# Patient Record
Sex: Female | Born: 1945 | Race: Black or African American | Hispanic: No | Smoking: Former smoker
Health system: Southern US, Community
[De-identification: ages and names within clinical notes are randomized; demographics above are authoritative.]

## PROBLEM LIST (undated history)

## (undated) DIAGNOSIS — K219 Gastro-esophageal reflux disease without esophagitis: Secondary | ICD-10-CM

## (undated) DIAGNOSIS — R519 Headache, unspecified: Secondary | ICD-10-CM

## (undated) DIAGNOSIS — I10 Essential (primary) hypertension: Secondary | ICD-10-CM

## (undated) DIAGNOSIS — E119 Type 2 diabetes mellitus without complications: Secondary | ICD-10-CM

## (undated) DIAGNOSIS — R51 Headache: Secondary | ICD-10-CM

## (undated) DIAGNOSIS — G8929 Other chronic pain: Secondary | ICD-10-CM

## (undated) DIAGNOSIS — J45909 Unspecified asthma, uncomplicated: Secondary | ICD-10-CM

## (undated) DIAGNOSIS — M1990 Unspecified osteoarthritis, unspecified site: Secondary | ICD-10-CM

## (undated) DIAGNOSIS — T7840XA Allergy, unspecified, initial encounter: Secondary | ICD-10-CM

## (undated) DIAGNOSIS — K5790 Diverticulosis of intestine, part unspecified, without perforation or abscess without bleeding: Secondary | ICD-10-CM

## (undated) DIAGNOSIS — N809 Endometriosis, unspecified: Secondary | ICD-10-CM

## (undated) DIAGNOSIS — M069 Rheumatoid arthritis, unspecified: Secondary | ICD-10-CM

## (undated) DIAGNOSIS — I219 Acute myocardial infarction, unspecified: Secondary | ICD-10-CM

## (undated) DIAGNOSIS — I2510 Atherosclerotic heart disease of native coronary artery without angina pectoris: Secondary | ICD-10-CM

## (undated) DIAGNOSIS — F32A Depression, unspecified: Secondary | ICD-10-CM

## (undated) DIAGNOSIS — E559 Vitamin D deficiency, unspecified: Secondary | ICD-10-CM

## (undated) DIAGNOSIS — R4586 Emotional lability: Secondary | ICD-10-CM

## (undated) DIAGNOSIS — E7800 Pure hypercholesterolemia, unspecified: Secondary | ICD-10-CM

## (undated) HISTORY — DX: Vitamin D deficiency, unspecified: E55.9

## (undated) HISTORY — DX: Atherosclerotic heart disease of native coronary artery without angina pectoris: I25.10

## (undated) HISTORY — PX: EXPLORATORY LAPAROTOMY: SUR591

## (undated) HISTORY — DX: Depression, unspecified: F32.A

## (undated) HISTORY — DX: Unspecified osteoarthritis, unspecified site: M19.90

## (undated) HISTORY — DX: Pure hypercholesterolemia, unspecified: E78.00

## (undated) HISTORY — DX: Gastro-esophageal reflux disease without esophagitis: K21.9

## (undated) HISTORY — DX: Essential (primary) hypertension: I10

## (undated) HISTORY — DX: Unspecified asthma, uncomplicated: J45.909

## (undated) HISTORY — DX: Headache, unspecified: R51.9

## (undated) HISTORY — DX: Acute myocardial infarction, unspecified: I21.9

## (undated) HISTORY — DX: Emotional lability: R45.86

## (undated) HISTORY — DX: Rheumatoid arthritis, unspecified: M06.9

## (undated) HISTORY — DX: Type 2 diabetes mellitus without complications: E11.9

## (undated) HISTORY — DX: Diverticulosis of intestine, part unspecified, without perforation or abscess without bleeding: K57.90

## (undated) HISTORY — DX: Headache: R51

## (undated) HISTORY — DX: Endometriosis, unspecified: N80.9

## (undated) HISTORY — DX: Other chronic pain: G89.29

## (undated) HISTORY — PX: ABDOMINAL HYSTERECTOMY: SHX81

## (undated) HISTORY — DX: Allergy, unspecified, initial encounter: T78.40XA

---

## 1997-09-21 ENCOUNTER — Encounter: Admission: RE | Admit: 1997-09-21 | Discharge: 1997-09-21 | Payer: Self-pay | Admitting: Family Medicine

## 1997-10-07 ENCOUNTER — Emergency Department (HOSPITAL_COMMUNITY): Admission: EM | Admit: 1997-10-07 | Discharge: 1997-10-07 | Payer: Self-pay

## 1997-11-11 ENCOUNTER — Emergency Department (HOSPITAL_COMMUNITY): Admission: EM | Admit: 1997-11-11 | Discharge: 1997-11-11 | Payer: Self-pay | Admitting: Emergency Medicine

## 1998-11-03 ENCOUNTER — Inpatient Hospital Stay (HOSPITAL_COMMUNITY): Admission: AD | Admit: 1998-11-03 | Discharge: 1998-11-03 | Payer: Self-pay | Admitting: General Practice

## 1999-09-13 ENCOUNTER — Encounter: Payer: Self-pay | Admitting: Emergency Medicine

## 1999-09-13 ENCOUNTER — Emergency Department (HOSPITAL_COMMUNITY): Admission: EM | Admit: 1999-09-13 | Discharge: 1999-09-13 | Payer: Self-pay | Admitting: Emergency Medicine

## 1999-10-20 ENCOUNTER — Encounter: Admission: RE | Admit: 1999-10-20 | Discharge: 1999-10-20 | Payer: Self-pay | Admitting: Family Medicine

## 1999-10-20 ENCOUNTER — Encounter: Payer: Self-pay | Admitting: Family Medicine

## 1999-10-28 ENCOUNTER — Encounter: Payer: Self-pay | Admitting: *Deleted

## 1999-10-28 ENCOUNTER — Encounter: Admission: RE | Admit: 1999-10-28 | Discharge: 1999-10-28 | Payer: Self-pay | Admitting: *Deleted

## 2000-04-26 ENCOUNTER — Emergency Department (HOSPITAL_COMMUNITY): Admission: EM | Admit: 2000-04-26 | Discharge: 2000-04-26 | Payer: Self-pay | Admitting: Emergency Medicine

## 2000-06-06 ENCOUNTER — Encounter: Admission: RE | Admit: 2000-06-06 | Discharge: 2000-06-06 | Payer: Self-pay | Admitting: Internal Medicine

## 2000-07-27 ENCOUNTER — Emergency Department (HOSPITAL_COMMUNITY): Admission: EM | Admit: 2000-07-27 | Discharge: 2000-07-28 | Payer: Self-pay | Admitting: *Deleted

## 2000-07-28 ENCOUNTER — Encounter: Payer: Self-pay | Admitting: Emergency Medicine

## 2000-08-20 ENCOUNTER — Emergency Department (HOSPITAL_COMMUNITY): Admission: EM | Admit: 2000-08-20 | Discharge: 2000-08-20 | Payer: Self-pay | Admitting: Emergency Medicine

## 2000-08-20 ENCOUNTER — Encounter: Payer: Self-pay | Admitting: Emergency Medicine

## 2001-01-15 ENCOUNTER — Emergency Department (HOSPITAL_COMMUNITY): Admission: EM | Admit: 2001-01-15 | Discharge: 2001-01-16 | Payer: Self-pay | Admitting: Emergency Medicine

## 2001-01-16 ENCOUNTER — Encounter: Payer: Self-pay | Admitting: Emergency Medicine

## 2001-02-25 ENCOUNTER — Emergency Department (HOSPITAL_COMMUNITY): Admission: EM | Admit: 2001-02-25 | Discharge: 2001-02-25 | Payer: Self-pay | Admitting: Emergency Medicine

## 2001-04-05 ENCOUNTER — Encounter: Payer: Self-pay | Admitting: Emergency Medicine

## 2001-04-05 ENCOUNTER — Emergency Department (HOSPITAL_COMMUNITY): Admission: EM | Admit: 2001-04-05 | Discharge: 2001-04-05 | Payer: Self-pay | Admitting: Emergency Medicine

## 2001-05-08 DIAGNOSIS — K226 Gastro-esophageal laceration-hemorrhage syndrome: Secondary | ICD-10-CM

## 2001-05-08 HISTORY — DX: Gastro-esophageal laceration-hemorrhage syndrome: K22.6

## 2001-07-04 ENCOUNTER — Ambulatory Visit (HOSPITAL_COMMUNITY): Admission: RE | Admit: 2001-07-04 | Discharge: 2001-07-04 | Payer: Self-pay | Admitting: Obstetrics

## 2001-07-04 ENCOUNTER — Encounter: Payer: Self-pay | Admitting: Obstetrics

## 2001-07-06 ENCOUNTER — Emergency Department (HOSPITAL_COMMUNITY): Admission: EM | Admit: 2001-07-06 | Discharge: 2001-07-06 | Payer: Self-pay | Admitting: Emergency Medicine

## 2001-08-16 ENCOUNTER — Ambulatory Visit (HOSPITAL_COMMUNITY): Admission: RE | Admit: 2001-08-16 | Discharge: 2001-08-16 | Payer: Self-pay | Admitting: *Deleted

## 2001-08-30 ENCOUNTER — Ambulatory Visit (HOSPITAL_COMMUNITY): Admission: RE | Admit: 2001-08-30 | Discharge: 2001-08-30 | Payer: Self-pay | Admitting: *Deleted

## 2001-09-01 ENCOUNTER — Emergency Department (HOSPITAL_COMMUNITY): Admission: EM | Admit: 2001-09-01 | Discharge: 2001-09-02 | Payer: Self-pay

## 2001-09-13 ENCOUNTER — Ambulatory Visit (HOSPITAL_COMMUNITY): Admission: RE | Admit: 2001-09-13 | Discharge: 2001-09-13 | Payer: Self-pay | Admitting: *Deleted

## 2001-10-31 ENCOUNTER — Encounter: Admission: RE | Admit: 2001-10-31 | Discharge: 2001-10-31 | Payer: Self-pay | Admitting: Internal Medicine

## 2002-03-05 ENCOUNTER — Emergency Department (HOSPITAL_COMMUNITY): Admission: EM | Admit: 2002-03-05 | Discharge: 2002-03-05 | Payer: Self-pay | Admitting: Emergency Medicine

## 2002-03-05 ENCOUNTER — Encounter: Payer: Self-pay | Admitting: Emergency Medicine

## 2002-03-10 ENCOUNTER — Ambulatory Visit (HOSPITAL_COMMUNITY): Admission: RE | Admit: 2002-03-10 | Discharge: 2002-03-10 | Payer: Self-pay | Admitting: *Deleted

## 2002-04-04 ENCOUNTER — Encounter: Payer: Self-pay | Admitting: Emergency Medicine

## 2002-04-04 ENCOUNTER — Emergency Department (HOSPITAL_COMMUNITY): Admission: EM | Admit: 2002-04-04 | Discharge: 2002-04-04 | Payer: Self-pay | Admitting: Emergency Medicine

## 2002-07-07 ENCOUNTER — Emergency Department (HOSPITAL_COMMUNITY): Admission: EM | Admit: 2002-07-07 | Discharge: 2002-07-07 | Payer: Self-pay | Admitting: *Deleted

## 2002-07-07 ENCOUNTER — Encounter: Payer: Self-pay | Admitting: Emergency Medicine

## 2002-09-25 ENCOUNTER — Other Ambulatory Visit: Admission: RE | Admit: 2002-09-25 | Discharge: 2002-09-25 | Payer: Self-pay | Admitting: *Deleted

## 2002-12-15 ENCOUNTER — Emergency Department (HOSPITAL_COMMUNITY): Admission: EM | Admit: 2002-12-15 | Discharge: 2002-12-15 | Payer: Self-pay | Admitting: Emergency Medicine

## 2002-12-15 ENCOUNTER — Encounter: Payer: Self-pay | Admitting: Emergency Medicine

## 2003-02-05 ENCOUNTER — Emergency Department (HOSPITAL_COMMUNITY): Admission: AD | Admit: 2003-02-05 | Discharge: 2003-02-05 | Payer: Self-pay | Admitting: Emergency Medicine

## 2003-02-25 ENCOUNTER — Ambulatory Visit (HOSPITAL_COMMUNITY): Admission: RE | Admit: 2003-02-25 | Discharge: 2003-02-25 | Payer: Self-pay | Admitting: Obstetrics

## 2003-02-25 ENCOUNTER — Encounter: Payer: Self-pay | Admitting: Obstetrics

## 2003-05-22 ENCOUNTER — Encounter: Payer: Self-pay | Admitting: Emergency Medicine

## 2003-05-22 ENCOUNTER — Inpatient Hospital Stay (HOSPITAL_COMMUNITY): Admission: EM | Admit: 2003-05-22 | Discharge: 2003-05-28 | Payer: Self-pay | Admitting: Psychiatry

## 2003-07-16 ENCOUNTER — Emergency Department (HOSPITAL_COMMUNITY): Admission: AD | Admit: 2003-07-16 | Discharge: 2003-07-16 | Payer: Self-pay | Admitting: Family Medicine

## 2003-09-21 ENCOUNTER — Emergency Department (HOSPITAL_COMMUNITY): Admission: EM | Admit: 2003-09-21 | Discharge: 2003-09-21 | Payer: Self-pay | Admitting: Emergency Medicine

## 2003-09-22 ENCOUNTER — Inpatient Hospital Stay (HOSPITAL_COMMUNITY): Admission: EM | Admit: 2003-09-22 | Discharge: 2003-09-28 | Payer: Self-pay | Admitting: Psychiatry

## 2003-09-24 ENCOUNTER — Encounter: Payer: Self-pay | Admitting: Psychiatry

## 2003-11-26 ENCOUNTER — Encounter: Admission: RE | Admit: 2003-11-26 | Discharge: 2003-11-26 | Payer: Self-pay | Admitting: Cardiology

## 2003-11-27 ENCOUNTER — Ambulatory Visit (HOSPITAL_COMMUNITY): Admission: RE | Admit: 2003-11-27 | Discharge: 2003-11-27 | Payer: Self-pay | Admitting: Cardiology

## 2003-12-06 ENCOUNTER — Encounter: Admission: RE | Admit: 2003-12-06 | Discharge: 2003-12-06 | Payer: Self-pay | Admitting: Cardiology

## 2004-01-27 ENCOUNTER — Ambulatory Visit (HOSPITAL_COMMUNITY): Admission: RE | Admit: 2004-01-27 | Discharge: 2004-01-27 | Payer: Self-pay | Admitting: Cardiology

## 2004-06-14 ENCOUNTER — Emergency Department (HOSPITAL_COMMUNITY): Admission: EM | Admit: 2004-06-14 | Discharge: 2004-06-14 | Payer: Self-pay | Admitting: Family Medicine

## 2004-07-27 ENCOUNTER — Emergency Department (HOSPITAL_COMMUNITY): Admission: EM | Admit: 2004-07-27 | Discharge: 2004-07-27 | Payer: Self-pay | Admitting: Family Medicine

## 2004-09-16 ENCOUNTER — Emergency Department (HOSPITAL_COMMUNITY): Admission: EM | Admit: 2004-09-16 | Discharge: 2004-09-16 | Payer: Self-pay | Admitting: Emergency Medicine

## 2005-03-01 ENCOUNTER — Emergency Department (HOSPITAL_COMMUNITY): Admission: EM | Admit: 2005-03-01 | Discharge: 2005-03-01 | Payer: Self-pay | Admitting: Family Medicine

## 2005-03-15 ENCOUNTER — Emergency Department (HOSPITAL_COMMUNITY): Admission: EM | Admit: 2005-03-15 | Discharge: 2005-03-15 | Payer: Self-pay | Admitting: Emergency Medicine

## 2005-03-21 ENCOUNTER — Emergency Department (HOSPITAL_COMMUNITY): Admission: EM | Admit: 2005-03-21 | Discharge: 2005-03-21 | Payer: Self-pay | Admitting: Emergency Medicine

## 2005-05-02 ENCOUNTER — Ambulatory Visit: Payer: Self-pay | Admitting: Internal Medicine

## 2005-05-04 ENCOUNTER — Ambulatory Visit (HOSPITAL_BASED_OUTPATIENT_CLINIC_OR_DEPARTMENT_OTHER): Admission: RE | Admit: 2005-05-04 | Discharge: 2005-05-04 | Payer: Self-pay | Admitting: Internal Medicine

## 2005-05-07 ENCOUNTER — Ambulatory Visit: Payer: Self-pay | Admitting: Internal Medicine

## 2005-09-21 ENCOUNTER — Inpatient Hospital Stay (HOSPITAL_COMMUNITY): Admission: EM | Admit: 2005-09-21 | Discharge: 2005-09-27 | Payer: Self-pay | Admitting: Psychiatry

## 2005-09-21 ENCOUNTER — Encounter: Payer: Self-pay | Admitting: Emergency Medicine

## 2005-09-22 ENCOUNTER — Ambulatory Visit: Payer: Self-pay | Admitting: Psychiatry

## 2006-05-10 ENCOUNTER — Ambulatory Visit (HOSPITAL_COMMUNITY): Admission: RE | Admit: 2006-05-10 | Discharge: 2006-05-10 | Payer: Self-pay | Admitting: Internal Medicine

## 2006-08-07 ENCOUNTER — Ambulatory Visit: Payer: Self-pay | Admitting: Gastroenterology

## 2006-08-09 ENCOUNTER — Ambulatory Visit: Payer: Self-pay | Admitting: Internal Medicine

## 2006-08-17 ENCOUNTER — Emergency Department (HOSPITAL_COMMUNITY): Admission: EM | Admit: 2006-08-17 | Discharge: 2006-08-17 | Payer: Self-pay | Admitting: Family Medicine

## 2007-05-20 ENCOUNTER — Emergency Department (HOSPITAL_COMMUNITY): Admission: EM | Admit: 2007-05-20 | Discharge: 2007-05-20 | Payer: Self-pay | Admitting: Family Medicine

## 2007-09-09 ENCOUNTER — Emergency Department (HOSPITAL_COMMUNITY): Admission: EM | Admit: 2007-09-09 | Discharge: 2007-09-09 | Payer: Self-pay | Admitting: Emergency Medicine

## 2008-04-21 ENCOUNTER — Emergency Department (HOSPITAL_COMMUNITY): Admission: EM | Admit: 2008-04-21 | Discharge: 2008-04-21 | Payer: Self-pay | Admitting: Emergency Medicine

## 2008-07-12 ENCOUNTER — Emergency Department (HOSPITAL_COMMUNITY): Admission: EM | Admit: 2008-07-12 | Discharge: 2008-07-12 | Payer: Self-pay | Admitting: Family Medicine

## 2008-08-26 ENCOUNTER — Emergency Department (HOSPITAL_COMMUNITY): Admission: EM | Admit: 2008-08-26 | Discharge: 2008-08-26 | Payer: Self-pay | Admitting: Family Medicine

## 2008-10-20 ENCOUNTER — Encounter: Admission: RE | Admit: 2008-10-20 | Discharge: 2008-10-20 | Payer: Self-pay | Admitting: Internal Medicine

## 2009-06-01 ENCOUNTER — Emergency Department (HOSPITAL_COMMUNITY): Admission: EM | Admit: 2009-06-01 | Discharge: 2009-06-01 | Payer: Self-pay | Admitting: Emergency Medicine

## 2009-10-07 ENCOUNTER — Inpatient Hospital Stay (HOSPITAL_COMMUNITY): Admission: AD | Admit: 2009-10-07 | Discharge: 2009-10-07 | Payer: Self-pay | Admitting: Obstetrics & Gynecology

## 2009-12-05 ENCOUNTER — Emergency Department (HOSPITAL_COMMUNITY): Admission: EM | Admit: 2009-12-05 | Discharge: 2009-12-05 | Payer: Self-pay | Admitting: Family Medicine

## 2010-01-17 ENCOUNTER — Emergency Department (HOSPITAL_COMMUNITY): Admission: EM | Admit: 2010-01-17 | Discharge: 2010-01-17 | Payer: Self-pay | Admitting: Family Medicine

## 2010-03-11 ENCOUNTER — Ambulatory Visit (HOSPITAL_COMMUNITY): Admission: RE | Admit: 2010-03-11 | Discharge: 2010-03-11 | Payer: Self-pay | Admitting: Internal Medicine

## 2010-05-17 ENCOUNTER — Emergency Department (HOSPITAL_COMMUNITY)
Admission: EM | Admit: 2010-05-17 | Discharge: 2010-05-17 | Payer: Self-pay | Source: Home / Self Care | Admitting: Family Medicine

## 2010-05-23 LAB — POCT I-STAT, CHEM 8
BUN: 5 mg/dL — ABNORMAL LOW (ref 6–23)
Calcium, Ion: 1.12 mmol/L (ref 1.12–1.32)
Chloride: 108 mEq/L (ref 96–112)
Creatinine, Ser: 1 mg/dL (ref 0.4–1.2)
Glucose, Bld: 95 mg/dL (ref 70–99)
HCT: 40 % (ref 36.0–46.0)
Hemoglobin: 13.6 g/dL (ref 12.0–15.0)
Potassium: 3.6 mEq/L (ref 3.5–5.1)
Sodium: 142 mEq/L (ref 135–145)
TCO2: 27 mmol/L (ref 0–100)

## 2010-05-29 ENCOUNTER — Encounter: Payer: Self-pay | Admitting: Cardiology

## 2010-07-21 LAB — POCT URINALYSIS DIPSTICK
Nitrite: NEGATIVE
Protein, ur: NEGATIVE mg/dL
Specific Gravity, Urine: <=1.005 (ref 1.005–1.030)
Urobilinogen, UA: 0.2 mg/dL (ref 0.0–1.0)

## 2010-07-21 LAB — CULTURE, ROUTINE-ABSCESS: Gram Stain: NONE SEEN

## 2010-07-23 LAB — POCT URINALYSIS DIP (DEVICE)
Nitrite: NEGATIVE
Protein, ur: NEGATIVE mg/dL
Specific Gravity, Urine: 1.025 (ref 1.005–1.030)
Urobilinogen, UA: 0.2 mg/dL (ref 0.0–1.0)

## 2010-07-25 LAB — URINALYSIS, ROUTINE W REFLEX MICROSCOPIC
Bilirubin Urine: NEGATIVE
Glucose, UA: NEGATIVE mg/dL
Ketones, ur: NEGATIVE mg/dL
Protein, ur: NEGATIVE mg/dL

## 2010-07-25 LAB — URINE CULTURE: Colony Count: 5000

## 2010-07-25 LAB — WET PREP, GENITAL
Trich, Wet Prep: NONE SEEN
Yeast Wet Prep HPF POC: NONE SEEN

## 2010-09-23 NOTE — Procedures (Signed)
NAME:  [name], [name]              ACCOUNT NO.:  [id number]   MEDICAL RECORD NO.:  [id number]          PATIENT TYPE:  OUT   LOCATION:  SLEEP CENTER                 FACILITY:  [facility]   PHYSICIAN:  Clinton D. Maple Hudson, M.D. DATE OF BIRTH:  17-Apr-1946   DATE OF STUDY:                              NOCTURNAL POLYSOMNOGRAM   REFERRING PHYSICIAN:  Dr. Jetty Duhamel.   DATE OF STUDY:  May 04, 2005.   INDICATION FOR STUDY:  Insomnia with sleep apnea.   EPWORTH SLEEPINESS SCORE:  6/24.   BMI:  34.   WEIGHT:  180 pounds.   HOME MEDICATIONS:  Allegra, Zelnorm, Zegerid, ibuprofen, Rhinocort nasal  spray.   SLEEP ARCHITECTURE:  Total sleep time 349 minutes with sleep efficiency 82%.  Stage I was 2%, stage II 86%, stages III and IV absent, REM 13% of total  sleep time. Sleep latency 9 minutes, REM latency 107 minutes, awake after  sleep onset 52 minutes, arousal index 17. No bedtime medication reported.   RESPIRATORY DATA:  Apnea/hypopnea index (AHI, RDI) 13.9 obstructive events  per hour indicating mild obstructive sleep apnea/hypopnea syndrome. There  were 6 obstructive apneas and 75 hypopneas. Events were not positional. REM  AHI 31.4. She slept on sides and supine. She did not qualify for split  protocol because of delayed onset of events.   OXYGEN DATA:  Moderate to loud snoring with oxygen desaturation to a nadir  of 86%. Mean oxygen saturation through the study was 94% on room air.   CARDIAC DATA:  Sinus rhythm with PACs.   MOVEMENT/PARASOMNIA:  Rare leg jerk. The patient gives a history of sleep  walking and sleep talking but none was noted during the study.   IMPRESSION/RECOMMENDATIONS:  1.  Mild obstructive sleep apnea/hypopnea syndrome, AHI 13.9 per hour with      nonpositional events more common during REM. Moderate to loud snoring      with oxygen desaturation to 86%.  2.  Consider return for C-PAP titration or evaluate for alternative      therapies as appropriate.  3.  No parasomnias reported although the patient gives history of such      events occasionally at home.      Clinton D. Maple Hudson, M.D.  Diplomate, Biomedical engineer of Sleep Medicine  Electronically Signed     CDY/MEDQ  D:  05/07/2005 11:09:22  T:  05/07/2005 16:57:21  Job:  [id number]
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
## 2010-09-23 NOTE — Consult Note (Signed)
NAMETURQUOISE, ESCH              ACCOUNT NO.:  [id number]   MEDICAL RECORD NO.:  [id number]          PATIENT TYPE:  OUT   LOCATION:  [facility]                          FACILITY:  [facility]   PHYSICIAN:  Georgiana Spinner, M.D.    DATE OF BIRTH:  August 15, 1945   DATE OF CONSULTATION:  DATE OF DISCHARGE:  05/10/2006                                 CONSULTATION   PHONE CONVERSATION:  Area code: 41, [phone]   This is a 65 year old lady, patient of Dr. Judie Petit T. Russella Dar who is  complaining of abdominal pain.  She said she knows it is gas pain  because she has had it before.  She has tried various laxatives without  much success for her chronic constipation.  She said she is getting  relief with Gas-X, four tablets, every couple of hours.  She had checked  with the pharmacy who said there was no limit to how much she could  take.  I told her, as long as she is getting relief, that she could  continue to take them and call us if she has any further problems.           ______________________________  Georgiana Spinner, M.D.     GMO/MEDQ  D:  06/22/2006  T:  06/24/2006  Job:  [id number]   cc:   Venita Lick. Russella Dar, MD, [facility]
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
## 2011-02-09 LAB — URINALYSIS, ROUTINE W REFLEX MICROSCOPIC
Bilirubin Urine: NEGATIVE
Nitrite: NEGATIVE
Specific Gravity, Urine: 1.027 (ref 1.005–1.030)
pH: 5.5 (ref 5.0–8.0)

## 2011-02-09 LAB — URINE MICROSCOPIC-ADD ON

## 2011-02-09 LAB — DIFFERENTIAL
Eosinophils Absolute: 0 10*3/uL (ref 0.0–0.7)
Lymphocytes Relative: 10 % — ABNORMAL LOW (ref 12–46)
Lymphs Abs: 1.2 10*3/uL (ref 0.7–4.0)
Monocytes Relative: 4 % (ref 3–12)
Neutro Abs: 11 10*3/uL — ABNORMAL HIGH (ref 1.7–7.7)
Neutrophils Relative %: 86 % — ABNORMAL HIGH (ref 43–77)

## 2011-02-09 LAB — BASIC METABOLIC PANEL
Chloride: 104 mEq/L (ref 96–112)
Creatinine, Ser: 1.01 mg/dL (ref 0.4–1.2)
GFR calc Af Amer: >60 mL/min (ref >60)

## 2011-02-09 LAB — CBC
MCV: 95.3 fL (ref 78.0–100.0)
RBC: 3.9 MIL/uL (ref 3.87–5.11)
WBC: 12.8 10*3/uL — ABNORMAL HIGH (ref 4.0–10.5)

## 2012-05-09 ENCOUNTER — Ambulatory Visit (HOSPITAL_BASED_OUTPATIENT_CLINIC_OR_DEPARTMENT_OTHER): Payer: Medicare Other | Attending: Internal Medicine | Admitting: Radiology

## 2012-05-09 VITALS — Ht 61.0 in | Wt 217.0 lb

## 2012-05-09 DIAGNOSIS — G4733 Obstructive sleep apnea (adult) (pediatric): Secondary | ICD-10-CM

## 2012-05-11 DIAGNOSIS — G4730 Sleep apnea, unspecified: Secondary | ICD-10-CM

## 2012-05-11 DIAGNOSIS — G4710 Hypersomnia, unspecified: Secondary | ICD-10-CM

## 2012-05-11 NOTE — Procedures (Signed)
NAME:  [name], [name]              ACCOUNT NO.:  [id number]  MEDICAL RECORD NO.:  [id number]          PATIENT TYPE:  OUT  LOCATION:  SLEEP CENTER                 FACILITY:  [facility]  PHYSICIAN:  Angalena Cousineau D. Maple Hudson, MD, FCCP, FACPDATE OF BIRTH:  07/18/1945  DATE OF STUDY:  05/09/2012                           NOCTURNAL POLYSOMNOGRAM  REFERRING PHYSICIAN:  Jackie Plum, M.D.  REFERRING PHYSICIAN:  Jackie Plum, MD  INDICATION FOR STUDY:  Hypersomnia with sleep apnea.  EPWORTH SLEEPINESS SCORE:  5/24.  BMI 24, weight 127 pounds, height 61 inches, neck 13 inches.  MEDICATIONS:  Home medications charted and reviewed.  There is available documentation of an Apnea Link report from February 28, 2012 indicating an AHI of 7 per hour consistent with mild sleep apnea.  The current study is scheduled for CPAP titration.  SLEEP ARCHITECTURE:  Total sleep time 35 minutes with sleep efficiency 7.9%.  Stage I was 11.4%, stage II 88.6%, stages III and REM were absent.  Sleep latency 188 minutes.  Awake after sleep onset 138.5 minutes.  Arousal index of 25.7.  Bedtime medication:  None.  She slept only briefly between midnight and 1 a.m. and again around 3 a.m.  RESPIRATORY DATA:  Apnea/hypopnea index (AHI) 0 per hour.  No respiratory events were recorded.  CPAP titration could not be done.  OXYGEN DATA:  No snoring noted by the technician.  Oxygen desaturation to a nadir of 94% with mean oxygen saturation through the study of 96.3% on room air.  CARDIAC DATA:  Normal sinus rhythm.  MOVEMENT/PARASOMNIA:  No significant movement disturbance.  Bathroom x2.  IMPRESSION/RECOMMENDATION: 1. The patient was not able to maintain sleep and recorded only 35     minutes of total sleep time throughout this study night.  No     specific issues were reported.  No bedtime medication was taken. 2. During limited sleep opportunity, no significant respiratory events     were recorded.  The CPAP titration  could not be successfully done. 3. An original diagnostic home sleep study (Apnea Link) on February 28, 2012 had recorded an AHI of 7 per     hour. 4. If appropriate, 1 option would be to repeat this CPAP titration     study bringing a sleep medication.     [name] D. Maple Hudson, MD, [facility], FACP Diplomate, American Board of Sleep Medicine    CDY/MEDQ  D:  05/11/2012 10:21:29  T:  05/11/2012 13:04:30  Job:  [id number]

## 2013-01-22 ENCOUNTER — Ambulatory Visit (INDEPENDENT_AMBULATORY_CARE_PROVIDER_SITE_OTHER): Payer: Medicare Other | Admitting: Obstetrics

## 2013-01-22 ENCOUNTER — Encounter: Payer: Self-pay | Admitting: Obstetrics

## 2013-01-22 VITALS — BP 137/85 | HR 70 | Temp 98.0°F | Ht 61.0 in | Wt 139.0 lb

## 2013-01-22 DIAGNOSIS — N951 Menopausal and female climacteric states: Secondary | ICD-10-CM | POA: Insufficient documentation

## 2013-01-22 DIAGNOSIS — R350 Frequency of micturition: Secondary | ICD-10-CM

## 2013-01-22 LAB — POCT URINALYSIS DIPSTICK
Bilirubin, UA: NEGATIVE
Blood, UA: NEGATIVE
Glucose, UA: NEGATIVE
Ketones, UA: NEGATIVE
Leukocytes, UA: NEGATIVE
Nitrite, UA: NEGATIVE
Protein, UA: NEGATIVE
Spec Grav, UA: <=1.005
Urobilinogen, UA: NEGATIVE
pH, UA: 5

## 2013-01-22 MED ORDER — ESTRADIOL 0.05 MG/24HR TD PTTW: 1.0000 | MEDICATED_PATCH | TRANSDERMAL | Status: DC

## 2013-01-22 NOTE — Progress Notes (Signed)
Subjective:     KEREN ALVERIO is a 67 y.o. female here for a routine exam.  Current complaints: Patient is in the office for annual exam- last pap over 1 year. Patient had hysterectomy for pain and endometriosis.Patient would like to discuss frequent urination, sweating , restlessness at night, and mood swings.Patient reports she sweats every day. Personal health questionnaire reviewed: no.   Gynecologic History No LMP recorded. Patient has had a hysterectomy. Contraception: none Last Pap: over 1 year. Results were: normal Last mammogram: 2014. Results were: normal  Obstetric History OB History  No data available     The following portions of the patient's history were reviewed and updated as appropriate: allergies, current medications, past family history, past medical history, past social history, past surgical history and problem list.  Review of Systems Pertinent items are noted in HPI.    Objective:    General appearance: alert and no distress PE:  Omitted.  Patient has had a hysterectomy and BSO.  Refused exam.      Assessment:    Menopausal Hot Flushes  Urinary Frequency.   Plan:    Education reviewed: calcium supplements, depression evaluation, self breast exams and menopausal management. Follow up in: 3 months. Estradiol transdermal patch Rx.

## 2013-01-23 ENCOUNTER — Encounter: Payer: Self-pay | Admitting: Obstetrics

## 2013-04-10 ENCOUNTER — Encounter (HOSPITAL_COMMUNITY)
Admission: RE | Disposition: A | Discharge status: Home / Self Care | Payer: Self-pay | Source: Ambulatory Visit | Attending: Cardiovascular Disease

## 2013-04-10 ENCOUNTER — Ambulatory Visit (HOSPITAL_COMMUNITY)
Admission: RE | Admit: 2013-04-10 | Discharge: 2013-04-10 | Disposition: A | Discharge status: Home / Self Care | Payer: Medicare Other | Source: Ambulatory Visit | Attending: Cardiovascular Disease | Admitting: Cardiovascular Disease

## 2013-04-10 DIAGNOSIS — J45909 Unspecified asthma, uncomplicated: Secondary | ICD-10-CM | POA: Insufficient documentation

## 2013-04-10 DIAGNOSIS — R079 Chest pain, unspecified: Secondary | ICD-10-CM | POA: Diagnosis present

## 2013-04-10 DIAGNOSIS — F39 Unspecified mood [affective] disorder: Secondary | ICD-10-CM | POA: Insufficient documentation

## 2013-04-10 DIAGNOSIS — I10 Essential (primary) hypertension: Secondary | ICD-10-CM | POA: Insufficient documentation

## 2013-04-10 HISTORY — PX: LEFT HEART CATHETERIZATION WITH CORONARY ANGIOGRAM: SHX5451

## 2013-04-10 LAB — CBC
MCV: 91.6 fL (ref 78.0–100.0)
Platelets: 248 10*3/uL (ref 150–400)
RBC: 3.82 MIL/uL — ABNORMAL LOW (ref 3.87–5.11)
RDW: 13.9 % (ref 11.5–15.5)
WBC: 4.6 10*3/uL (ref 4.0–10.5)

## 2013-04-10 LAB — PROTIME-INR
INR: 1.01 (ref 0.00–1.49)
Prothrombin Time: 13.1 seconds (ref 11.6–15.2)

## 2013-04-10 LAB — BASIC METABOLIC PANEL
CO2: 23 mEq/L (ref 19–32)
Calcium: 8.6 mg/dL (ref 8.4–10.5)
GFR calc Af Amer: 67 mL/min — ABNORMAL LOW (ref >90)
GFR calc non Af Amer: 58 mL/min — ABNORMAL LOW (ref >90)
Sodium: 141 mEq/L (ref 135–145)

## 2013-04-10 LAB — APTT: aPTT: 23 seconds — ABNORMAL LOW (ref 24–37)

## 2013-04-10 SURGERY — LEFT HEART CATHETERIZATION WITH CORONARY ANGIOGRAM
Anesthesia: LOCAL

## 2013-04-10 MED ORDER — LIDOCAINE HCL (PF) 1 % IJ SOLN
INTRAMUSCULAR | Status: AC
  Filled 2013-04-10: qty 30

## 2013-04-10 MED ORDER — ONDANSETRON HCL 4 MG/2ML IJ SOLN: 4.0000 mg | Freq: Four times a day (QID) | INTRAMUSCULAR | Status: DC | PRN

## 2013-04-10 MED ORDER — FENTANYL CITRATE 0.05 MG/ML IJ SOLN
INTRAMUSCULAR | Status: AC
  Filled 2013-04-10: qty 2

## 2013-04-10 MED ORDER — ASPIRIN 81 MG PO CHEW
81.0000 mg | CHEWABLE_TABLET | ORAL | Status: AC
  Administered 2013-04-10: 81 mg via ORAL

## 2013-04-10 MED ORDER — ASPIRIN 81 MG PO CHEW
CHEWABLE_TABLET | ORAL | Status: AC
  Filled 2013-04-10: qty 1

## 2013-04-10 MED ORDER — SODIUM CHLORIDE 0.9 % IV SOLN: INTRAVENOUS | Status: AC

## 2013-04-10 MED ORDER — SODIUM CHLORIDE 0.9 % IJ SOLN: 3.0000 mL | INTRAMUSCULAR | Status: DC | PRN

## 2013-04-10 MED ORDER — SODIUM CHLORIDE 0.9 % IV SOLN
INTRAVENOUS | Status: DC
  Administered 2013-04-10: 07:00:00 via INTRAVENOUS

## 2013-04-10 MED ORDER — NITROGLYCERIN 0.2 MG/ML ON CALL CATH LAB
INTRAVENOUS | Status: AC
  Filled 2013-04-10: qty 1

## 2013-04-10 MED ORDER — SODIUM CHLORIDE 0.9 % IV SOLN: 250.0000 mL | INTRAVENOUS | Status: DC | PRN

## 2013-04-10 MED ORDER — MIDAZOLAM HCL 2 MG/2ML IJ SOLN
INTRAMUSCULAR | Status: AC
  Filled 2013-04-10: qty 2

## 2013-04-10 MED ORDER — ACETAMINOPHEN 325 MG PO TABS: 650.0000 mg | ORAL_TABLET | ORAL | Status: DC | PRN

## 2013-04-10 MED ORDER — HEPARIN (PORCINE) IN NACL 2-0.9 UNIT/ML-% IJ SOLN
INTRAMUSCULAR | Status: AC
  Filled 2013-04-10: qty 1000

## 2013-04-10 MED ORDER — SODIUM CHLORIDE 0.9 % IJ SOLN: 3.0000 mL | Freq: Two times a day (BID) | INTRAMUSCULAR | Status: DC

## 2013-04-10 NOTE — H&P (Signed)
[name] is an 67 y.o. female.   Chief Complaint: Chest pain HPI: 67 years old female with exertional chest pain had abnormal stress test.  Past Medical History  Diagnosis Date  . Hypertension   . Asthma   . Mood swings   . Endometriosis       Past Surgical History  Procedure Laterality Date  . Abdominal hysterectomy    . Exploratory laparotomy      Family History  Problem Relation Age of Onset  . Cancer Mother   . Heart disease Mother   . Cancer Father   . Heart disease Father    Social History:  reports that she has quit smoking. She does not have any smokeless tobacco history on file. She reports that she drinks alcohol. She reports that she does not use illicit drugs.  Allergies: Not on File  Medications Prior to Admission  Medication Sig Dispense Refill  . amLODipine (NORVASC) 2.5 MG tablet Take 2.5 mg by mouth daily.      . beclomethasone (QVAR) 80 MCG/ACT inhaler Inhale 1 puff into the lungs as needed.      . benztropine (COGENTIN) 1 MG tablet Take 1 mg by mouth daily.      Marland Kitchen esomeprazole (NEXIUM) 40 MG capsule Take 40 mg by mouth daily before breakfast.      . estradiol (VIVELLE-DOT) 0.05 MG/24HR patch Place 1 patch (0.05 mg total) onto the skin once a week.  8 patch  12  . sertraline (ZOLOFT) 50 MG tablet Take 50 mg by mouth daily.        No results found for this or any previous visit (from the past 48 hour(s)). No results found.  ROS No weight gain or loss, + reading glasses, No asthma, No COPD, No GI or GU bleed, No stroke, seizures. + psychiatric admission There were no vitals taken for this visit. General appearance: cooperative and appears stated age Head: Normocephalic, without obvious abnormality, atraumatic Eyes: conjunctivae/corneas clear. PERRL, EOM's intact. Neck: no adenopathy, no carotid bruit, no JVD, supple, symmetrical, trachea midline and thyroid not enlarged, symmetric, no tenderness/mass/nodules Resp: clear to auscultation  bilaterally Cardio: regular rate and rhythm, S1, S2 normal, no murmur, click, rub or gallop GI: soft, non-tender; bowel sounds normal; no masses,  no organomegaly Extremities: extremities normal, atraumatic, no cyanosis or edema Skin: Skin color, texture, turgor normal. No rashes or lesions Neurologic: Alert and oriented X 3, normal strength and tone. Normal coordination and gait  Assessment/Plan Chest pain CAD Chronic schitzophrenia  C. Cath today.  Amorita Vanrossum S 04/10/2013, 6:31 AM

## 2013-04-10 NOTE — CV Procedure (Signed)
PROCEDURE:  Left heart catheterization with selective coronary angiography, left ventriculogram.  CLINICAL HISTORY:  This is a 67 year old female with shortness of breath, chest pain on exertion had abnormal stress test.  The risks, benefits, and details of the procedure were explained to the patient.  The patient verbalized understanding and wanted to proceed.  Informed written consent was obtained.  PROCEDURE TECHNIQUE:  The patient was approached from the right femoral artery using a 5 French short sheath.  Left coronary angiography was done using a Judkins L4 guide catheter.  Right coronary angiography was done using a Judkins R4 guide catheter.  Left ventriculography was done using a pigtail catheter.    CONTRAST:  Total of 43 cc.  COMPLICATIONS:  None.  At the end of the procedure a manual device was used for hemostasis.    HEMODYNAMICS:  Aortic pressure was 126/66; LV pressure was 129/7; LVEDP 11.  There was no gradient between the left ventricle and aorta.    ANGIOGRAM/CORONARY ARTERIOGRAM:   The left main coronary artery is unremarkable.  The left anterior descending artery wraps around apex of the heart and is unremarkable.  The left circumflex artery is unremarkable. OM 1, 2 and 3 are also unremarkable. Diagonal one is unremarkable  The right coronary artery is dominant and unremarkable.  LEFT VENTRICULOGRAM:  Left ventricular angiogram was done in the 30 RAO projection and revealed normal left ventricular wall motion and systolic function with an estimated ejection fraction of 70 %.  LVEDP was 11 mmHg.  IMPRESSION OF HEART CATHETERIZATION:   1. Normal left main coronary artery. 2. Normal left anterior descending artery and its branches. 3. Normal left circumflex artery and its branches. 4. Normal right coronary artery. 5. Normal left ventricular systolic function.  LVEDP 11 mmHg.  Ejection fraction 70 %.  RECOMMENDATION:   Medical treatment.

## 2013-04-10 NOTE — Interval H&P Note (Signed)
History and Physical Interval Note:  04/10/2013 6:42 AM  [name]  has presented today for surgery, with the diagnosis of Chest pain  The various methods of treatment have been discussed with the patient and family. After consideration of risks, benefits and other options for treatment, the patient has consented to  Procedure(s): LEFT HEART CATHETERIZATION WITH CORONARY ANGIOGRAM (N/A) as a surgical intervention .  The patient's history has been reviewed, patient examined, no change in status, stable for surgery.  I have reviewed the patient's chart and labs.  Questions were answered to the patient's satisfaction.     Marguerette Sheller S

## 2013-04-23 ENCOUNTER — Ambulatory Visit: Payer: Medicare Other | Admitting: Obstetrics

## 2013-11-11 ENCOUNTER — Other Ambulatory Visit: Payer: Self-pay | Admitting: Cardiovascular Disease

## 2013-11-11 ENCOUNTER — Ambulatory Visit
Admission: RE | Admit: 2013-11-11 | Discharge: 2013-11-11 | Disposition: A | Discharge status: Home / Self Care | Payer: Medicare Other | Source: Ambulatory Visit | Attending: Cardiovascular Disease | Admitting: Cardiovascular Disease

## 2013-11-11 DIAGNOSIS — R200 Anesthesia of skin: Secondary | ICD-10-CM

## 2013-11-14 ENCOUNTER — Other Ambulatory Visit: Payer: Self-pay | Admitting: Cardiovascular Disease

## 2013-11-14 DIAGNOSIS — M542 Cervicalgia: Secondary | ICD-10-CM

## 2013-11-20 ENCOUNTER — Ambulatory Visit
Admission: RE | Admit: 2013-11-20 | Discharge: 2013-11-20 | Disposition: A | Discharge status: Home / Self Care | Payer: Medicare Other | Source: Ambulatory Visit | Attending: Cardiovascular Disease | Admitting: Cardiovascular Disease

## 2013-11-20 DIAGNOSIS — M542 Cervicalgia: Secondary | ICD-10-CM

## 2014-04-16 ENCOUNTER — Encounter (HOSPITAL_COMMUNITY): Payer: Self-pay | Admitting: Cardiovascular Disease

## 2014-05-15 ENCOUNTER — Encounter: Payer: Self-pay | Admitting: Gastroenterology

## 2014-10-27 ENCOUNTER — Other Ambulatory Visit (HOSPITAL_COMMUNITY): Payer: Self-pay | Admitting: Internal Medicine

## 2014-10-27 DIAGNOSIS — Z1231 Encounter for screening mammogram for malignant neoplasm of breast: Secondary | ICD-10-CM

## 2014-11-04 ENCOUNTER — Ambulatory Visit (HOSPITAL_COMMUNITY): Payer: Medicare Other

## 2014-11-05 ENCOUNTER — Ambulatory Visit (HOSPITAL_COMMUNITY)
Admission: RE | Admit: 2014-11-05 | Discharge: 2014-11-05 | Disposition: A | Discharge status: Home / Self Care | Payer: Medicare Other | Source: Ambulatory Visit | Attending: Internal Medicine | Admitting: Internal Medicine

## 2014-11-05 DIAGNOSIS — Z1231 Encounter for screening mammogram for malignant neoplasm of breast: Secondary | ICD-10-CM

## 2014-12-02 ENCOUNTER — Other Ambulatory Visit: Payer: Self-pay | Admitting: Internal Medicine

## 2014-12-02 DIAGNOSIS — R5381 Other malaise: Secondary | ICD-10-CM

## 2014-12-03 ENCOUNTER — Other Ambulatory Visit: Payer: Self-pay | Admitting: Internal Medicine

## 2014-12-03 DIAGNOSIS — E2839 Other primary ovarian failure: Secondary | ICD-10-CM

## 2015-02-23 ENCOUNTER — Encounter: Payer: Self-pay | Admitting: Pulmonary Disease

## 2015-02-23 ENCOUNTER — Ambulatory Visit (INDEPENDENT_AMBULATORY_CARE_PROVIDER_SITE_OTHER): Payer: Medicare Other | Admitting: Pulmonary Disease

## 2015-02-23 VITALS — BP 122/84 | HR 68 | Ht 62.0 in | Wt 153.0 lb

## 2015-02-23 DIAGNOSIS — G4700 Insomnia, unspecified: Secondary | ICD-10-CM | POA: Diagnosis not present

## 2015-02-23 DIAGNOSIS — Z8709 Personal history of other diseases of the respiratory system: Secondary | ICD-10-CM | POA: Diagnosis not present

## 2015-02-23 DIAGNOSIS — G4733 Obstructive sleep apnea (adult) (pediatric): Secondary | ICD-10-CM | POA: Insufficient documentation

## 2015-02-23 DIAGNOSIS — J4530 Mild persistent asthma, uncomplicated: Secondary | ICD-10-CM | POA: Diagnosis not present

## 2015-02-23 DIAGNOSIS — J45909 Unspecified asthma, uncomplicated: Secondary | ICD-10-CM | POA: Insufficient documentation

## 2015-02-23 NOTE — Assessment & Plan Note (Signed)
This is very mild based on her study in 2013. Does not need further treatment Weight loss would be increased if possible

## 2015-02-23 NOTE — Progress Notes (Signed)
Subjective:    Patient ID: [name], female    DOB: 27-Jun-1945, 69 y.o.   MRN: [id number]  HPI   Chief Complaint  Patient presents with  . SLEEP CONSULT    pt states she has a difficult time falling asleep. pt states she wakes very restless. pt states she tries to nap during the day but she cant. pt states at times she cant breath at all through her nose and she has some chest pain she thinks might be related to fluid in her lungs which she has a history of. Epworth score: 60   69 year old remote smoker presents for evaluation of asthma and sleep-disordered breathing. She reports difficulty falling asleep for many years, sleeps only 3-4 hours per night. She reports a recent MI 3 months ago and sees a cardiologist. I note normal cath In 04/2013. She reports a diagnosis of asthma and is maintained on Qvar and Advair and albuterol as needed. She reports dyspnea on exertion is not clear whether this is cardiac or due to asthma. She denies frequent wheezing or nocturnal symptoms Epworth sleepiness score is 1 Bedtime is around 9 PM, sleep latency is about 2 hours, she sleeps in a fetal position with one pillow, reports 3+ nocturnal awakenings without nocturia and is out of bed by 4:30 AM feeling tired but denies dryness of mouth or headaches. She had a negative polysomnogram by report 10 years ago. I note an apnea link study in 02/2012 which showed an AHI of 7/hour, but total sleep time was only 2 hours. She did not tolerate CPAP on a subsequent titration attempt in 05/2012. She reports weight gain of 20 pounds since the last study  Significant tests/ events  Spirometry - nml lung function, FEV1 94%  Past Medical History  Diagnosis Date  . Hypertension   . Asthma   . Mood swings (HCC)   . Endometriosis   . Heart attack (HCC)   . Allergy   . Chronic headaches     Past Surgical History  Procedure Laterality Date  . Abdominal hysterectomy    . Exploratory laparotomy    . Left  heart catheterization with coronary angiogram N/A 04/10/2013    Procedure: LEFT HEART CATHETERIZATION WITH CORONARY ANGIOGRAM;  Surgeon: Ricki Rodriguez, MD;  Location: [facility] CATH LAB;  Service: Cardiovascular;  Laterality: N/A;    No Known Allergies  Social History   Social History  . Marital Status: Divorced    Spouse Name: N/A  . Number of Children: N/A  . Years of Education: N/A   Occupational History  . Not on file.   Social History Main Topics  . Smoking status: Former Games developer  . Smokeless tobacco: Not on file  . Alcohol Use: Yes     Comment: occasional  . Drug Use: No  . Sexual Activity: No   Other Topics Concern  . Not on file   Social History Narrative    Family History  Problem Relation Age of Onset  . Cancer Mother   . Heart disease Mother   . Cancer Father   . Heart disease Father      Review of Systems  Constitutional: Negative for fever and unexpected weight change.  HENT: Positive for ear pain, sore throat and trouble swallowing. Negative for congestion, dental problem, nosebleeds, postnasal drip, rhinorrhea, sinus pressure and sneezing.   Eyes: Positive for redness and itching.  Respiratory: Positive for chest tightness, shortness of breath and wheezing. Negative for cough.  Cardiovascular: Positive for palpitations and leg swelling.  Gastrointestinal: Positive for nausea. Negative for vomiting.  Genitourinary: Positive for dysuria.  Musculoskeletal: Positive for joint swelling.  Skin: Negative for rash.  Neurological: Positive for headaches.  Hematological: Bruises/bleeds easily.  Psychiatric/Behavioral: Negative for dysphoric mood. The patient is nervous/anxious.        Objective:   Physical Exam  Gen. Pleasant, well-nourished, in no distress, normal affect ENT - no lesions, no post nasal drip Neck: No JVD, no thyromegaly, no carotid bruits Lungs: no use of accessory muscles, no dullness to percussion, clear without rales or rhonchi    Cardiovascular: Rhythm regular, heart sounds  normal, no murmurs or gallops, no peripheral edema Abdomen: soft and non-tender, no hepatosplenomegaly, BS normal. Musculoskeletal: No deformities, no cyanosis or clubbing Neuro:  alert, non focal       Assessment & Plan:

## 2015-02-23 NOTE — Assessment & Plan Note (Signed)
Her main problem seems to be insomnia rather than sleep-disordered breathing. She will try melatonin 5 mg to see if this provides any decrease in sleep onset. I do not think she would be a good candidate for long-term hypnotics. Behavior therapy can be tried for this problem is persistent and she can be referred to a insomnia behavior specialist

## 2015-02-23 NOTE — Assessment & Plan Note (Signed)
Spirometry shows normal lung function today She is using both Qvar and Advair. I have asked her to stop Qvar, continue on Advair and use albuterol as needed

## 2015-02-23 NOTE — Patient Instructions (Addendum)
Trial of melatonin 5 mg at bedtime Lung function is good Stop qvar - Stay on advair twice daily

## 2015-07-08 ENCOUNTER — Other Ambulatory Visit: Payer: Self-pay | Admitting: Physician Assistant

## 2015-07-08 DIAGNOSIS — R102 Pelvic and perineal pain: Secondary | ICD-10-CM

## 2015-07-15 ENCOUNTER — Ambulatory Visit
Admission: RE | Admit: 2015-07-15 | Discharge: 2015-07-15 | Disposition: A | Discharge status: Home / Self Care | Payer: Medicare Other | Source: Ambulatory Visit | Attending: Physician Assistant | Admitting: Physician Assistant

## 2015-07-15 DIAGNOSIS — R102 Pelvic and perineal pain: Secondary | ICD-10-CM

## 2016-02-16 ENCOUNTER — Other Ambulatory Visit: Payer: Self-pay | Admitting: Internal Medicine

## 2016-02-16 DIAGNOSIS — Z1231 Encounter for screening mammogram for malignant neoplasm of breast: Secondary | ICD-10-CM

## 2016-03-16 ENCOUNTER — Ambulatory Visit: Payer: Medicare Other

## 2016-05-23 ENCOUNTER — Ambulatory Visit: Payer: Medicare Other

## 2017-07-30 ENCOUNTER — Encounter (HOSPITAL_COMMUNITY): Payer: Self-pay

## 2017-07-30 ENCOUNTER — Emergency Department (HOSPITAL_COMMUNITY)
Admission: EM | Admit: 2017-07-30 | Discharge: 2017-07-31 | Disposition: A | Discharge status: Home / Self Care | Payer: Medicare Other | Attending: Emergency Medicine | Admitting: Emergency Medicine

## 2017-07-30 ENCOUNTER — Emergency Department (HOSPITAL_COMMUNITY): Payer: Medicare Other

## 2017-07-30 DIAGNOSIS — F4321 Adjustment disorder with depressed mood: Secondary | ICD-10-CM | POA: Diagnosis not present

## 2017-07-30 DIAGNOSIS — R05 Cough: Secondary | ICD-10-CM | POA: Diagnosis not present

## 2017-07-30 DIAGNOSIS — R45851 Suicidal ideations: Secondary | ICD-10-CM

## 2017-07-30 DIAGNOSIS — Z79899 Other long term (current) drug therapy: Secondary | ICD-10-CM | POA: Diagnosis not present

## 2017-07-30 DIAGNOSIS — Z046 Encounter for general psychiatric examination, requested by authority: Secondary | ICD-10-CM | POA: Diagnosis not present

## 2017-07-30 DIAGNOSIS — J45909 Unspecified asthma, uncomplicated: Secondary | ICD-10-CM | POA: Insufficient documentation

## 2017-07-30 DIAGNOSIS — F4310 Post-traumatic stress disorder, unspecified: Secondary | ICD-10-CM | POA: Insufficient documentation

## 2017-07-30 DIAGNOSIS — Z87891 Personal history of nicotine dependence: Secondary | ICD-10-CM | POA: Diagnosis not present

## 2017-07-30 DIAGNOSIS — F329 Major depressive disorder, single episode, unspecified: Secondary | ICD-10-CM

## 2017-07-30 DIAGNOSIS — F32A Depression, unspecified: Secondary | ICD-10-CM

## 2017-07-30 LAB — URINALYSIS, ROUTINE W REFLEX MICROSCOPIC
Bacteria, UA: NONE SEEN
Bilirubin Urine: NEGATIVE
Glucose, UA: NEGATIVE mg/dL
KETONES UR: NEGATIVE mg/dL
Nitrite: NEGATIVE
PH: 5 (ref 5.0–8.0)
Protein, ur: NEGATIVE mg/dL
SPECIFIC GRAVITY, URINE: 1.018 (ref 1.005–1.030)

## 2017-07-30 LAB — CBC
HEMATOCRIT: 35.8 % — AB (ref 36.0–46.0)
HEMOGLOBIN: 12.9 g/dL (ref 12.0–15.0)
MCH: 32.4 pg (ref 26.0–34.0)
MCHC: 36 g/dL (ref 30.0–36.0)
MCV: 89.9 fL (ref 78.0–100.0)
Platelets: 245 10*3/uL (ref 150–400)
RBC: 3.98 MIL/uL (ref 3.87–5.11)
RDW: 14.3 % (ref 11.5–15.5)
WBC: 5.8 10*3/uL (ref 4.0–10.5)

## 2017-07-30 LAB — ETHANOL: Alcohol, Ethyl (B): <10 mg/dL (ref <10)

## 2017-07-30 LAB — RAPID URINE DRUG SCREEN, HOSP PERFORMED
AMPHETAMINES: NOT DETECTED
BARBITURATES: NOT DETECTED
BENZODIAZEPINES: NOT DETECTED
COCAINE: NOT DETECTED
Opiates: NOT DETECTED
TETRAHYDROCANNABINOL: NOT DETECTED

## 2017-07-30 LAB — COMPREHENSIVE METABOLIC PANEL
ALBUMIN: 3.8 g/dL (ref 3.5–5.0)
ALK PHOS: 94 U/L (ref 38–126)
ALT: 18 U/L (ref 14–54)
ANION GAP: 10 (ref 5–15)
AST: 20 U/L (ref 15–41)
BUN: 15 mg/dL (ref 6–20)
CO2: 24 mmol/L (ref 22–32)
Calcium: 9.1 mg/dL (ref 8.9–10.3)
Chloride: 107 mmol/L (ref 101–111)
Creatinine, Ser: 0.85 mg/dL (ref 0.44–1.00)
GFR calc Af Amer: >60 mL/min (ref >60)
GFR calc non Af Amer: >60 mL/min (ref >60)
GLUCOSE: 110 mg/dL — AB (ref 65–99)
POTASSIUM: 3.4 mmol/L — AB (ref 3.5–5.1)
SODIUM: 141 mmol/L (ref 135–145)
Total Bilirubin: 0.5 mg/dL (ref 0.3–1.2)
Total Protein: 7.5 g/dL (ref 6.5–8.1)

## 2017-07-30 LAB — ACETAMINOPHEN LEVEL

## 2017-07-30 LAB — I-STAT TROPONIN, ED: Troponin i, poc: 0 ng/mL (ref 0.00–0.08)

## 2017-07-30 LAB — SALICYLATE LEVEL: Salicylate Lvl: <7 mg/dL (ref 2.8–30.0)

## 2017-07-30 MED ORDER — ACETAMINOPHEN 325 MG PO TABS
650.0000 mg | ORAL_TABLET | Freq: Once | ORAL | Status: AC
  Administered 2017-07-30: 650 mg via ORAL
  Filled 2017-07-30: qty 2

## 2017-07-30 MED ORDER — AMLODIPINE BESYLATE 5 MG PO TABS
2.5000 mg | ORAL_TABLET | Freq: Every day | ORAL | Status: DC
  Administered 2017-07-30: 2.5 mg via ORAL
  Filled 2017-07-30: qty 1

## 2017-07-30 MED ORDER — MOMETASONE FURO-FORMOTEROL FUM 100-5 MCG/ACT IN AERO
2.0000 | INHALATION_SPRAY | Freq: Two times a day (BID) | RESPIRATORY_TRACT | Status: DC
  Administered 2017-07-31: 2 via RESPIRATORY_TRACT
  Filled 2017-07-30: qty 8.8

## 2017-07-30 MED ORDER — PANTOPRAZOLE SODIUM 40 MG PO TBEC
40.0000 mg | DELAYED_RELEASE_TABLET | Freq: Every day | ORAL | Status: DC
  Administered 2017-07-31: 40 mg via ORAL
  Filled 2017-07-30: qty 1

## 2017-07-30 MED ORDER — ALBUTEROL SULFATE HFA 108 (90 BASE) MCG/ACT IN AERS
2.0000 | INHALATION_SPRAY | Freq: Four times a day (QID) | RESPIRATORY_TRACT | Status: DC | PRN
Start: 2017-07-30 — End: 2017-07-31

## 2017-07-30 NOTE — ED Triage Notes (Signed)
Pt states that she made a comment today about killing herself because she's under stress with apartment renovations [facility] and they brought her in Pt tried to go to [facility] but has a heart history  Pt smells like urine and states that she hasn't taken any mental health medications since 1914

## 2017-07-30 NOTE — ED Notes (Signed)
Bed: [facility] Expected date:  Expected time:  Means of arrival:  Comments: chaimbers

## 2017-07-30 NOTE — ED Notes (Signed)
Unable to collect labs at this time patient is on the way to xray

## 2017-07-30 NOTE — ED Notes (Signed)
Bed: [facility] Expected date:  Expected time:  Means of arrival:  Comments: 

## 2017-07-30 NOTE — ED Provider Notes (Signed)
[facility]-EMERGENCY DEPT Provider Note   CSN: [id number] Arrival date & time: 07/30/17  4098     History   Chief Complaint Chief Complaint  Patient presents with  . Medical Clearance    HPI [name] is a 72 y.o. female.  Patient is a 72 year old female with a history of hypertension, prior MI and mood swings.  She states that she lives in an apartment that is undergoing renovations.  She states that she has to pack up and moved to a hotel while the renovations are undergoing.  Reportedly she made a comment about killing herself.  Mobile crisis was called and did an evaluation and brought her here for further treatment.  Patient currently denies any suicidal ideations.  She did tell the nurse however that she had has not taken her mental health medications since 1914.  She denies any hallucinations.  She states that she has intermittent chest pain for the last several weeks.  She denies any shortness of breath.  She has had some coughing and cold symptoms.  She has some pain in her left ear.  She denies any other physical complaints.     Past Medical History:  Diagnosis Date  . Allergy   . Asthma   . Chronic headaches   . Endometriosis   . Heart attack (HCC)   . Hypertension   . Mood swings     Patient Active Problem List   Diagnosis Date Noted  . Insomnia 02/23/2015  . OSA (obstructive sleep apnea) 02/23/2015  . Asthma 02/23/2015  . Chest pain on exertion 04/10/2013  . Menopausal hot flushes 01/22/2013  . Frequency of urination 01/22/2013    Past Surgical History:  Procedure Laterality Date  . ABDOMINAL HYSTERECTOMY    . EXPLORATORY LAPAROTOMY    . LEFT HEART CATHETERIZATION WITH CORONARY ANGIOGRAM N/A 04/10/2013   Procedure: LEFT HEART CATHETERIZATION WITH CORONARY ANGIOGRAM;  Surgeon: Ricki Rodriguez, MD;  Location: [facility] CATH LAB;  Service: Cardiovascular;  Laterality: N/A;     OB History   None      Home Medications    Prior to  Admission medications   Medication Sig Start Date End Date Taking? Authorizing Provider  albuterol (PROVENTIL HFA;VENTOLIN HFA) 108 (90 Base) MCG/ACT inhaler Inhale 2 puffs into the lungs every 6 (six) hours as needed for wheezing or shortness of breath.   Yes [provider]  amLODipine (NORVASC) 2.5 MG tablet Take 2.5 mg by mouth at bedtime.    Yes [provider]  aspirin EC 325 MG tablet Take 325 mg by mouth daily as needed for mild pain.   Yes [provider]  beclomethasone (QVAR) 80 MCG/ACT inhaler Inhale 1 puff into the lungs as needed (for shortness of breath).    Yes [provider]  calcium-vitamin D (OSCAL WITH D) 500-200 MG-UNIT tablet Take 1 tablet by mouth daily with breakfast.    Yes [provider]  Cholecalciferol (VITAMIN D3) 3000 UNITS TABS Take 3,000 Units by mouth daily.   Yes [provider]  ibuprofen (ADVIL,MOTRIN) 200 MG tablet Take 200 mg by mouth every 6 (six) hours as needed for moderate pain.   Yes [provider]  nitroGLYCERIN (NITROSTAT) 0.4 MG SL tablet Place 0.4 mg under the tongue every 5 (five) minutes as needed for chest pain.   Yes [provider]  omeprazole (PRILOSEC) 40 MG capsule Take 40 mg by mouth daily.   Yes [provider]  estradiol (VIVELLE-DOT)  0.05 MG/24HR patch Place 1 patch (0.05 mg total) onto the skin once a week. Patient not taking: Reported on 07/30/2017 01/22/13   Brock Bad, MD  Fluticasone-Salmeterol (ADVAIR) 100-50 MCG/DOSE AEPB Inhale 1 puff into the lungs 2 (two) times daily.    [provider]    Family History Family History  Problem Relation Age of Onset  . Cancer Mother   . Heart disease Mother   . Cancer Father   . Heart disease Father     Social History Social History   Tobacco Use  . Smoking status: Former Games developer  . Smokeless tobacco: Never Used  Substance Use Topics  . Alcohol use: Yes    Comment: occasional  . Drug  use: No     Allergies   Patient has no known allergies.   Review of Systems Review of Systems  Constitutional: Positive for fatigue. Negative for chills, diaphoresis and fever.  HENT: Positive for congestion, ear pain and rhinorrhea. Negative for sneezing.   Eyes: Negative.   Respiratory: Positive for cough. Negative for chest tightness and shortness of breath.   Cardiovascular: Positive for chest pain. Negative for leg swelling.  Gastrointestinal: Negative for abdominal pain, blood in stool, diarrhea, nausea and vomiting.  Genitourinary: Negative for difficulty urinating, flank pain, frequency and hematuria.  Musculoskeletal: Negative for arthralgias and back pain.  Skin: Negative for rash.  Neurological: Negative for dizziness, speech difficulty, weakness, numbness and headaches.  Psychiatric/Behavioral: Positive for dysphoric mood.     Physical Exam Updated Vital Signs BP (!) 173/102 (BP Location: Left Arm)   Pulse 79   Temp 97.9 F (36.6 C) (Oral)   Resp 15   Ht 5\' 1"  (1.549 m)   Wt 72.3 kg (159 lb 4.8 oz)   SpO2 100%   BMI 30.10 kg/m   Physical Exam  Constitutional: She is oriented to person, place, and time. She appears well-developed and well-nourished.  HENT:  Head: Normocephalic and atraumatic.  Right Ear: External ear normal.  Left Ear: External ear normal.  Mouth/Throat: Oropharynx is clear and moist.  Eyes: Pupils are equal, round, and reactive to light.  Neck: Normal range of motion. Neck supple.  Cardiovascular: Normal rate, regular rhythm and normal heart sounds.  Pulmonary/Chest: Effort normal and breath sounds normal. No respiratory distress. She has no wheezes. She has no rales. She exhibits tenderness (Reproducible tenderness over the sternum).  Abdominal: Soft. Bowel sounds are normal. There is no tenderness. There is no rebound and no guarding.  Musculoskeletal: Normal range of motion. She exhibits no edema.  Lymphadenopathy:    She has no  cervical adenopathy.  Neurological: She is alert and oriented to person, place, and time.  Skin: Skin is warm and dry. No rash noted.  Psychiatric: Her speech is rapid and/or pressured.     ED Treatments / Results  Labs (all labs ordered are listed, but only abnormal results are displayed) Labs Reviewed  COMPREHENSIVE METABOLIC PANEL - Abnormal; Notable for the following components:      Result Value   Potassium 3.4 (*)    Glucose, Bld 110 (*)    All other components within normal limits  ACETAMINOPHEN LEVEL - Abnormal; Notable for the following components:   Acetaminophen (Tylenol), Serum <10 (*)    All other components within normal limits  CBC - Abnormal; Notable for the following components:   HCT 35.8 (*)    All other components within normal limits  URINALYSIS, ROUTINE W REFLEX MICROSCOPIC - Abnormal; Notable  for the following components:   Hgb urine dipstick MODERATE (*)    Leukocytes, UA SMALL (*)    Squamous Epithelial / LPF 0-5 (*)    All other components within normal limits  URINE CULTURE  ETHANOL  SALICYLATE LEVEL  RAPID URINE DRUG SCREEN, HOSP PERFORMED  I-STAT TROPONIN, ED    EKG EKG Interpretation  Date/Time:  Monday July 30 2017 21:52:40 EDT Ventricular Rate:  73 PR Interval:    QRS Duration: 88 QT Interval:  421 QTC Calculation: 464 R Axis:   65 Text Interpretation:  Sinus rhythm Prominent P waves, nondiagnostic Low voltage, extremity leads Nonspecific T abnormalities, lateral leads since last tracing no significant change Confirmed by Rolan Bucco [phone]) on 07/30/2017 10:14:26 PM   Radiology Dg Chest 2 View  Result Date: 07/30/2017 CLINICAL DATA:  72 year old female with altered mental status. Cough. Former smoker. EXAM: CHEST - 2 VIEW COMPARISON:  Chest radiographs 09/21/2005 and earlier. FINDINGS: Lung volumes are stable since 2007 and within normal limits. Stable cardiac size and mediastinal contours. Cardiac size at the upper limits of  normal. No pneumothorax, pulmonary edema, pleural effusion or confluent pulmonary opacity. Dextroconvex thoracic scoliosis. No acute osseous abnormality identified. Negative visible bowel gas pattern. IMPRESSION: No acute cardiopulmonary abnormality. Electronically Signed   By: Odessa Fleming M.D.   On: 07/30/2017 20:32    Procedures Procedures (including critical care time)  Medications Ordered in ED Medications  albuterol (PROVENTIL HFA;VENTOLIN HFA) 108 (90 Base) MCG/ACT inhaler 2 puff (has no administration in time range)  amLODipine (NORVASC) tablet 2.5 mg (2.5 mg Oral Given 07/30/17 2244)  mometasone-formoterol (DULERA) 100-5 MCG/ACT inhaler 2 puff (2 puffs Inhalation Refused 07/30/17 2245)  pantoprazole (PROTONIX) EC tablet 40 mg (has no administration in time range)  acetaminophen (TYLENOL) tablet 650 mg (has no administration in time range)     Initial Impression / Assessment and Plan / ED Course  I have reviewed the triage vital signs and the nursing notes.  Pertinent labs & imaging results that were available during my care of the patient were reviewed by me and considered in my medical decision making (see chart for details).     Pt presents with psychiatric evaluation.  Has some vague chest pain, no EKG ischemic changes.  Normal troponin.  Chest x-ray shows no acute abnormalities.  Her labs have been reviewed and are non-concerning.  She is medically cleared and awaiting TTS evaluation.  Her urine is not convincing for infection.  It was sent for culture.  Final Clinical Impressions(s) / ED Diagnoses   Final diagnoses:  Depression, unspecified depression type  Suicidal ideation    ED Discharge Orders    None       Rolan Bucco, MD 07/30/17 2315

## 2017-07-31 DIAGNOSIS — F329 Major depressive disorder, single episode, unspecified: Secondary | ICD-10-CM | POA: Diagnosis present

## 2017-07-31 NOTE — BH Assessment (Signed)
[facility] Assessment Progress Note  Per Jacqueline Norman, DO, this pt does not require psychiatric hospitalization at this time.  Pt is to be discharged from [facility] with recommendation to follow up with [facility].  This has been included in pt's discharge instructions.  Pt's nurse, Ashley, has been notified.  Zaylei Mullane, MA Triage Specialist [phone]     

## 2017-07-31 NOTE — ED Notes (Signed)
Pt taking a shower 

## 2017-07-31 NOTE — Discharge Instructions (Signed)
For your mental health needs, you are advised to follow up with [facility].  New and returning patients are seen at their walk-in clinic.  Walk-in hours are Monday - Friday from 8:00 am - 3:00 pm.  Walk-in patients are seen on a first come, first served basis.  Try to arrive as early as possible for he best chance of being seen the same day: ° °     [facility] °     [address] °     [place], [place] [address] °     [phone] °

## 2017-07-31 NOTE — ED Notes (Signed)
Pt d/c home per MD order. Discharge summary reviewed with pt, pt verbalizes understanding. Pt denies SI/HI/AVH. Pt signed for personal property and property returned. Pt signed e-signature. Ambulatory off unit with MHT.  
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
## 2017-07-31 NOTE — ED Notes (Signed)
Assumed care. Patient seen sleeping. No distress noted. Will continue to monitor patient.
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
## 2017-08-01 LAB — URINE CULTURE: Culture: NO GROWTH

## 2017-08-04 ENCOUNTER — Emergency Department (HOSPITAL_COMMUNITY): Payer: Medicare Other | Admitting: Anesthesiology

## 2017-08-04 ENCOUNTER — Encounter (HOSPITAL_COMMUNITY)
Admission: EM | Disposition: A | Discharge status: Home / Self Care | Payer: Self-pay | Source: Home / Self Care | Attending: Emergency Medicine

## 2017-08-04 ENCOUNTER — Ambulatory Visit (HOSPITAL_COMMUNITY)
Admission: EM | Admit: 2017-08-04 | Discharge: 2017-08-04 | Disposition: A | Discharge status: Home / Self Care | Payer: Medicare Other | Attending: Emergency Medicine | Admitting: Emergency Medicine

## 2017-08-04 ENCOUNTER — Encounter (HOSPITAL_COMMUNITY): Payer: Self-pay

## 2017-08-04 ENCOUNTER — Emergency Department (HOSPITAL_COMMUNITY): Payer: Medicare Other

## 2017-08-04 DIAGNOSIS — S61251A Open bite of left index finger without damage to nail, initial encounter: Secondary | ICD-10-CM | POA: Insufficient documentation

## 2017-08-04 DIAGNOSIS — G4730 Sleep apnea, unspecified: Secondary | ICD-10-CM | POA: Diagnosis not present

## 2017-08-04 DIAGNOSIS — M65142 Other infective (teno)synovitis, left hand: Secondary | ICD-10-CM | POA: Diagnosis not present

## 2017-08-04 DIAGNOSIS — I252 Old myocardial infarction: Secondary | ICD-10-CM | POA: Diagnosis not present

## 2017-08-04 DIAGNOSIS — Z79899 Other long term (current) drug therapy: Secondary | ICD-10-CM | POA: Diagnosis not present

## 2017-08-04 DIAGNOSIS — Z7982 Long term (current) use of aspirin: Secondary | ICD-10-CM | POA: Diagnosis not present

## 2017-08-04 DIAGNOSIS — L089 Local infection of the skin and subcutaneous tissue, unspecified: Secondary | ICD-10-CM

## 2017-08-04 DIAGNOSIS — K219 Gastro-esophageal reflux disease without esophagitis: Secondary | ICD-10-CM | POA: Insufficient documentation

## 2017-08-04 DIAGNOSIS — S61452A Open bite of left hand, initial encounter: Secondary | ICD-10-CM

## 2017-08-04 DIAGNOSIS — W5501XA Bitten by cat, initial encounter: Secondary | ICD-10-CM | POA: Insufficient documentation

## 2017-08-04 DIAGNOSIS — Z7951 Long term (current) use of inhaled steroids: Secondary | ICD-10-CM | POA: Diagnosis not present

## 2017-08-04 DIAGNOSIS — I10 Essential (primary) hypertension: Secondary | ICD-10-CM | POA: Diagnosis not present

## 2017-08-04 DIAGNOSIS — Z87891 Personal history of nicotine dependence: Secondary | ICD-10-CM | POA: Diagnosis not present

## 2017-08-04 HISTORY — PX: I & D EXTREMITY: SHX5045

## 2017-08-04 LAB — CBC WITH DIFFERENTIAL/PLATELET
BASOS ABS: 0 10*3/uL (ref 0.0–0.1)
Basophils Relative: 0 %
EOS PCT: 1 %
Eosinophils Absolute: 0.1 10*3/uL (ref 0.0–0.7)
HEMATOCRIT: 35.2 % — AB (ref 36.0–46.0)
Hemoglobin: 12.2 g/dL (ref 12.0–15.0)
Lymphocytes Relative: 21 %
Lymphs Abs: 1.7 10*3/uL (ref 0.7–4.0)
MCH: 31.5 pg (ref 26.0–34.0)
MCHC: 34.7 g/dL (ref 30.0–36.0)
MCV: 91 fL (ref 78.0–100.0)
MONO ABS: 0.5 10*3/uL (ref 0.1–1.0)
MONOS PCT: 6 %
Neutro Abs: 5.9 10*3/uL (ref 1.7–7.7)
Neutrophils Relative %: 72 %
Platelets: 249 10*3/uL (ref 150–400)
RBC: 3.87 MIL/uL (ref 3.87–5.11)
RDW: 14.2 % (ref 11.5–15.5)
WBC: 8.2 10*3/uL (ref 4.0–10.5)

## 2017-08-04 LAB — COMPREHENSIVE METABOLIC PANEL
ALBUMIN: 3.6 g/dL (ref 3.5–5.0)
ALK PHOS: 74 U/L (ref 38–126)
ALT: 14 U/L (ref 14–54)
AST: 19 U/L (ref 15–41)
Anion gap: 13 (ref 5–15)
BILIRUBIN TOTAL: 0.7 mg/dL (ref 0.3–1.2)
BUN: 16 mg/dL (ref 6–20)
CALCIUM: 8.7 mg/dL — AB (ref 8.9–10.3)
CO2: 17 mmol/L — AB (ref 22–32)
CREATININE: 1.13 mg/dL — AB (ref 0.44–1.00)
Chloride: 106 mmol/L (ref 101–111)
GFR calc Af Amer: 56 mL/min — ABNORMAL LOW (ref >60)
GFR calc non Af Amer: 48 mL/min — ABNORMAL LOW (ref >60)
GLUCOSE: 82 mg/dL (ref 65–99)
Potassium: 3.4 mmol/L — ABNORMAL LOW (ref 3.5–5.1)
Sodium: 136 mmol/L (ref 135–145)
TOTAL PROTEIN: 7.1 g/dL (ref 6.5–8.1)

## 2017-08-04 SURGERY — IRRIGATION AND DEBRIDEMENT EXTREMITY
Anesthesia: General | Site: Arm Lower | Laterality: Left

## 2017-08-04 MED ORDER — AMOXICILLIN-POT CLAVULANATE 875-125 MG PO TABS: 1.0000 | ORAL_TABLET | Freq: Two times a day (BID) | ORAL | 0 refills | Status: DC

## 2017-08-04 MED ORDER — HYDROCODONE-ACETAMINOPHEN 5-325 MG PO TABS
1.0000 | ORAL_TABLET | Freq: Once | ORAL | Status: AC
  Administered 2017-08-04: 1 via ORAL

## 2017-08-04 MED ORDER — LACTATED RINGERS IV SOLN: INTRAVENOUS | Status: DC

## 2017-08-04 MED ORDER — HYDROCODONE-ACETAMINOPHEN 5-325 MG PO TABS: ORAL_TABLET | ORAL | 0 refills | Status: DC

## 2017-08-04 MED ORDER — MEPERIDINE HCL 50 MG/ML IJ SOLN: 6.2500 mg | INTRAMUSCULAR | Status: DC | PRN

## 2017-08-04 MED ORDER — FENTANYL CITRATE (PF) 100 MCG/2ML IJ SOLN: 25.0000 ug | INTRAMUSCULAR | Status: DC | PRN

## 2017-08-04 MED ORDER — PROMETHAZINE HCL 25 MG/ML IJ SOLN: 6.2500 mg | INTRAMUSCULAR | Status: DC | PRN

## 2017-08-04 MED ORDER — DEXTROSE 5 % IV SOLN
INTRAVENOUS | Status: DC | PRN
  Administered 2017-08-04: 50 ug/min via INTRAVENOUS

## 2017-08-04 MED ORDER — FENTANYL CITRATE (PF) 100 MCG/2ML IJ SOLN
INTRAMUSCULAR | Status: DC | PRN
  Administered 2017-08-04: 25 ug via INTRAVENOUS
  Administered 2017-08-04: 50 ug via INTRAVENOUS
  Administered 2017-08-04: 25 ug via INTRAVENOUS

## 2017-08-04 MED ORDER — EPHEDRINE SULFATE 50 MG/ML IJ SOLN
INTRAMUSCULAR | Status: DC | PRN
  Administered 2017-08-04: 10 mg via INTRAVENOUS

## 2017-08-04 MED ORDER — 0.9 % SODIUM CHLORIDE (POUR BTL) OPTIME
TOPICAL | Status: DC | PRN
  Administered 2017-08-04: 1000 mL

## 2017-08-04 MED ORDER — HYDROCODONE-ACETAMINOPHEN 5-325 MG PO TABS
ORAL_TABLET | ORAL | Status: AC
  Filled 2017-08-04: qty 1

## 2017-08-04 MED ORDER — BUPIVACAINE HCL (PF) 0.25 % IJ SOLN
INTRAMUSCULAR | Status: AC
  Filled 2017-08-04: qty 30

## 2017-08-04 MED ORDER — BUPIVACAINE HCL (PF) 0.25 % IJ SOLN
INTRAMUSCULAR | Status: DC | PRN
  Administered 2017-08-04: 10 mL

## 2017-08-04 MED ORDER — AMPICILLIN-SULBACTAM SODIUM 3 (2-1) G IJ SOLR
3.0000 g | Freq: Once | INTRAMUSCULAR | Status: AC
  Administered 2017-08-04: 3 g via INTRAVENOUS
  Filled 2017-08-04 (×2): qty 3

## 2017-08-04 MED ORDER — PROPOFOL 10 MG/ML IV BOLUS
INTRAVENOUS | Status: AC
  Filled 2017-08-04: qty 20

## 2017-08-04 MED ORDER — LACTATED RINGERS IV SOLN
INTRAVENOUS | Status: DC
  Administered 2017-08-04: 16:00:00 via INTRAVENOUS

## 2017-08-04 MED ORDER — LIDOCAINE HCL (CARDIAC) 20 MG/ML IV SOLN
INTRAVENOUS | Status: AC
  Filled 2017-08-04: qty 5

## 2017-08-04 MED ORDER — FENTANYL CITRATE (PF) 250 MCG/5ML IJ SOLN
INTRAMUSCULAR | Status: AC
  Filled 2017-08-04: qty 5

## 2017-08-04 MED ORDER — ONDANSETRON HCL 4 MG/2ML IJ SOLN
INTRAMUSCULAR | Status: AC
  Filled 2017-08-04: qty 2

## 2017-08-04 MED ORDER — PHENYLEPHRINE HCL 10 MG/ML IJ SOLN
INTRAMUSCULAR | Status: DC | PRN
  Administered 2017-08-04: 240 ug via INTRAVENOUS
  Administered 2017-08-04: 120 ug via INTRAVENOUS

## 2017-08-04 MED ORDER — HYDROMORPHONE HCL 1 MG/ML IJ SOLN: 0.2500 mg | INTRAMUSCULAR | Status: DC | PRN

## 2017-08-04 SURGICAL SUPPLY — 54 items
BANDAGE ACE 3X5.8 VEL STRL LF (GAUZE/BANDAGES/DRESSINGS) ×3 IMPLANT
BANDAGE ACE 4X5 VEL STRL LF (GAUZE/BANDAGES/DRESSINGS) ×3 IMPLANT
BANDAGE COBAN STERILE 2 (GAUZE/BANDAGES/DRESSINGS) IMPLANT
BNDG CMPR 9X4 STRL LF SNTH (GAUZE/BANDAGES/DRESSINGS)
BNDG ESMARK 4X9 LF (GAUZE/BANDAGES/DRESSINGS) IMPLANT
BNDG GAUZE ELAST 4 BULKY (GAUZE/BANDAGES/DRESSINGS) ×3 IMPLANT
CORDS BIPOLAR (ELECTRODE) ×3 IMPLANT
COVER SURGICAL LIGHT HANDLE (MISCELLANEOUS) ×3 IMPLANT
CUFF TOURNIQUET SINGLE 18IN (TOURNIQUET CUFF) IMPLANT
CUFF TOURNIQUET SINGLE 24IN (TOURNIQUET CUFF) IMPLANT
DECANTER SPIKE VIAL GLASS SM (MISCELLANEOUS) ×3 IMPLANT
DRAIN PENROSE 1/4X12 LTX STRL (WOUND CARE) IMPLANT
DRSG PAD ABDOMINAL 8X10 ST (GAUZE/BANDAGES/DRESSINGS) ×6 IMPLANT
GAUZE SPONGE 4X4 12PLY STRL (GAUZE/BANDAGES/DRESSINGS) ×3 IMPLANT
GAUZE SPONGE 4X4 16PLY XRAY LF (GAUZE/BANDAGES/DRESSINGS) ×2 IMPLANT
GAUZE XEROFORM 1X8 LF (GAUZE/BANDAGES/DRESSINGS) ×3 IMPLANT
GLOVE BIO SURGEON STRL SZ7.5 (GLOVE) ×3 IMPLANT
GLOVE BIOGEL PI IND STRL 8 (GLOVE) ×1 IMPLANT
GLOVE BIOGEL PI INDICATOR 8 (GLOVE) ×2
GOWN STRL REUS W/ TWL LRG LVL3 (GOWN DISPOSABLE) ×1 IMPLANT
GOWN STRL REUS W/ TWL XL LVL3 (GOWN DISPOSABLE) ×1 IMPLANT
GOWN STRL REUS W/TWL LRG LVL3 (GOWN DISPOSABLE) ×3
GOWN STRL REUS W/TWL XL LVL3 (GOWN DISPOSABLE) ×3
KIT BASIN OR (CUSTOM PROCEDURE TRAY) ×3 IMPLANT
KIT TURNOVER KIT B (KITS) ×3 IMPLANT
LOOP VESSEL MAXI BLUE (MISCELLANEOUS) IMPLANT
MANIFOLD NEPTUNE II (INSTRUMENTS) ×2 IMPLANT
NDL HYPO 25X1 1.5 SAFETY (NEEDLE) IMPLANT
NEEDLE HYPO 25X1 1.5 SAFETY (NEEDLE) IMPLANT
NS IRRIG 1000ML POUR BTL (IV SOLUTION) ×3 IMPLANT
PACK ORTHO EXTREMITY (CUSTOM PROCEDURE TRAY) ×3 IMPLANT
PAD ARMBOARD 7.5X6 YLW CONV (MISCELLANEOUS) ×6 IMPLANT
PAD CAST 3X4 CTTN HI CHSV (CAST SUPPLIES) IMPLANT
PADDING CAST COTTON 3X4 STRL (CAST SUPPLIES) ×3
PADDING CAST SYNTHETIC 4 (CAST SUPPLIES) ×2
PADDING CAST SYNTHETIC 4X4 STR (CAST SUPPLIES) IMPLANT
SCRUB BETADINE 4OZ XXX (MISCELLANEOUS) ×3 IMPLANT
SET CYSTO W/LG BORE CLAMP LF (SET/KITS/TRAYS/PACK) IMPLANT
SOL PREP POV-IOD 4OZ 10% (MISCELLANEOUS) ×3 IMPLANT
SPLINT PLASTER EXTRA FAST 3X15 (CAST SUPPLIES) ×2
SPLINT PLASTER GYPS XFAST 3X15 (CAST SUPPLIES) IMPLANT
SPONGE LAP 4X18 X RAY DECT (DISPOSABLE) ×3 IMPLANT
SUT ETHILON 4 0 P 3 18 (SUTURE) IMPLANT
SUT ETHILON 4 0 PS 2 18 (SUTURE) IMPLANT
SUT MON AB 5-0 P3 18 (SUTURE) IMPLANT
SWAB COLLECTION DEVICE MRSA (MISCELLANEOUS) IMPLANT
SWAB CULTURE ESWAB REG 1ML (MISCELLANEOUS) IMPLANT
SYR CONTROL 10ML LL (SYRINGE) IMPLANT
TOWEL OR 17X26 10 PK STRL BLUE (TOWEL DISPOSABLE) ×3 IMPLANT
TUBE CONNECTING 12'X1/4 (SUCTIONS) ×1
TUBE CONNECTING 12X1/4 (SUCTIONS) ×2 IMPLANT
TUBE FEEDING ENTERAL 5FR 16IN (TUBING) IMPLANT
UNDERPAD 30X30 (UNDERPADS AND DIAPERS) ×3 IMPLANT
YANKAUER SUCT BULB TIP NO VENT (SUCTIONS) ×5 IMPLANT

## 2017-08-04 NOTE — Op Note (Signed)
[name], [name]              ACCOUNT NO.:  [id number]  MEDICAL RECORD NO.:  [id number]  LOCATION:  [facility]                         FACILITY:  [facility]  PHYSICIAN:  Betha Loa, MD        DATE OF BIRTH:  01/26/1946  DATE OF PROCEDURE:  08/04/2017 DATE OF DISCHARGE:                              OPERATIVE REPORT   PREOPERATIVE DIAGNOSIS:  Left index finger cat bite with possible flexor tenosynovitis.  POSTOPERATIVE DIAGNOSIS:  Left index finger/hand cat bite with index finger flexor sheath infection.  PROCEDURE:   1. Left index finger irrigation and debridement of flexor tendon sheath and incision  2. Left index finger drainage of infected cat bite on the dorsum of finger.  SURGEON:  Betha Loa, MD.  ASSISTANTS:  None.  ANESTHESIA:  General.  IV FLUIDS:  Per Anesthesia flow sheet.  ESTIMATED BLOOD LOSS:  Minimal.  COMPLICATIONS:  None.  SPECIMENS:  Cultures to Micro.  TOURNIQUET TIME:  23 minutes.  DISPOSITION:  Stable to PACU.  INDICATIONS:  [name] is a 72 year old female who states 2 days ago she was bitten on the left hand by the neighbor's cat.  She has had progressive swelling and erythema of the left index finger since.  She had had some fevers and chills, but does not currently.  She has minimal pain in the hand.  I recommended incision and drainage in the operating room.  Risks, benefits, and alternatives of surgery were discussed including the risk of blood loss; infection; damage to nerves, vessels, tendons, ligaments, bone; failure of surgery; need for additional surgery; complications with wound healing; continued pain; continued infection; need for repeat irrigation and debridement.  She voiced understanding of these risks and elected to proceed.  OPERATIVE COURSE:  After being identified preoperatively by myself, the patient and I agreed upon procedure and site of procedure.  Surgical site was marked.  Risks, benefits, and alternatives of surgery  were reviewed and she wished to proceed.  Surgical consent had been signed. She had been given IV Unasyn as coverage for the cat bite.  She was transferred to the operating room and placed on the operating room table in supine position with the left upper extremity on an arm board. General anesthesia was induced by anesthesiologist.  The left upper extremity was prepped and draped in normal sterile orthopedic fashion. A surgical pause was performed between surgeons, Anesthesia, and operating room staff; and all were in agreement as to the patient, procedure, and site of procedure.  She had some contracture of the shoulder and digits due to previous injury.  The bite wounds were opened.  There was purulence within one of the 2 on the side of the hand near the MP joint.  Cultures were taken for aerobes and anaerobes.  The bite wound on the dorsum of the hand was opened as well.  There was a small amount of purulence here.  This was incised sharply with a knife and the subcutaneous tissues spread there.  There was thin watery fluid. On the volar aspect of the hand, an incision was made over the MP joint of the index finger and carried into subcutaneous tissues by spreading technique.  The A1 pulley was opened.  There was cloudy fluid within the flexor tendon sheath.  An additional incision was made at the distal phalanx.  A #5 pediatric feeding tube was then placed into the sheath. This was used to irrigate the flexor sheath with sterile saline. Copious irrigation was performed.  Good effluent was obtained from both proximal and distal wounds.  All wounds were copiously irrigated with sterile saline and then packed with quarter-inch iodoform gauze.  The wounds were injected with 0.25% plain Marcaine to aid in postoperative analgesia.  They were then dressed with sterile 4 x 4's and wrapped with a Kerlix bandage.  A dorsal splint was placed and wrapped with an Ace bandage for comfort.  The  feeding tube drain was left in as a drain and will be removed in 1-2 days.  She tolerated the procedure well.  The tourniquet was deflated at 23 minutes.  Fingertips were pink with brisk capillary refill after deflation of the tourniquet.  The operative drapes were broken down and the patient was awoken from anesthesia safely.  She was transferred back to stretcher and taken to PACU in stable condition.  I will see her back in the office in 2 days for postoperative followup. I will give her Norco 5/325 one to two p.o. q.6 hours p.r.n. pain, dispensed #20 and Augmentin 875 mg p.o. b.i.d. x7 days.     Betha Loa, MD     KK/MEDQ  D:  08/04/2017  T:  08/04/2017  Job:  [id number]

## 2017-08-04 NOTE — ED Triage Notes (Signed)
Patient complains of left hand redness and swelling after being bitten by her cat on Thursday to index finger.  Patient states goes out sometimes and not up to date on rabies

## 2017-08-04 NOTE — ED Notes (Signed)
Pt friend left pt belongings at bedside; security stating they will only lock up valuables. This RN asked pt what valuables she would like locked up, pt stating she has no valuables with her to have locked up.

## 2017-08-04 NOTE — Progress Notes (Signed)
2 bags of belongings taken to PACU. 

## 2017-08-04 NOTE — ED Provider Notes (Signed)
Patient bitten by a cat on left index finger days ago.  She complains of pain over the entire dorsum of her hand since the event.  Other associated symptoms include subjective fever.  No treatment prior to coming here.   Doug Sou, MD 08/04/17 1258
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
## 2017-08-04 NOTE — ED Notes (Signed)
ED Provider at bedside. 

## 2017-08-04 NOTE — Brief Op Note (Signed)
08/04/2017  4:56 PM  PATIENT:  [name]  72 y.o. female  PRE-OPERATIVE DIAGNOSIS:  infected hand  POST-OPERATIVE DIAGNOSIS:  infected hand  PROCEDURE:  Procedure(s): IRRIGATION AND DEBRIDEMENT HAND (Left)  SURGEON:  Surgeon(s) and Role:    Betha Loa, MD - Primary  PHYSICIAN ASSISTANT:   ASSISTANTS: none   ANESTHESIA:   general  EBL:  Minimal   BLOOD ADMINISTERED:none  DRAINS: Penrose drain in the feeding tube drain and iodoform packing   LOCAL MEDICATIONS USED:  MARCAINE     SPECIMEN:  Source of Specimen:  left index finger  DISPOSITION OF SPECIMEN:  micro  COUNTS:  YES  TOURNIQUET:   Total Tourniquet Time Documented: Upper Arm (Left) - 23 minutes Total: Upper Arm (Left) - 23 minutes   DICTATION: .Other Dictation: Dictation Number [id number]  PLAN OF CARE: Discharge to home after PACU  PATIENT DISPOSITION:  PACU - hemodynamically stable.

## 2017-08-04 NOTE — Transfer of Care (Signed)
Immediate Anesthesia Transfer of Care Note  Patient: [name]  Procedure(s) Performed: IRRIGATION AND DEBRIDEMENT HAND (Left Arm Lower)  Patient Location: PACU  Anesthesia Type:General  Level of Consciousness: awake and patient cooperative  Airway & Oxygen Therapy: Patient Spontanous Breathing  Post-op Assessment: Report given to RN and Post -op Vital signs reviewed and stable  Post vital signs: Reviewed and stable  Last Vitals:  Vitals Value Taken Time  BP 105/77 08/04/2017  4:58 PM  Temp 36.7 C 08/04/2017  4:58 PM  Pulse 95 08/04/2017  5:01 PM  Resp 21 08/04/2017  5:01 PM  SpO2 97 % 08/04/2017  5:01 PM  Vitals shown include unvalidated device data.  Last Pain:  Vitals:   08/04/17 1658  TempSrc:   PainSc: 0-No pain         Complications: No apparent anesthesia complications

## 2017-08-04 NOTE — Discharge Instructions (Addendum)

## 2017-08-04 NOTE — Anesthesia Postprocedure Evaluation (Signed)
Anesthesia Post Note  Patient: [name]  Procedure(s) Performed: IRRIGATION AND DEBRIDEMENT HAND (Left Arm Lower)     Patient location during evaluation: PACU Anesthesia Type: General Level of consciousness: awake and alert Pain management: pain level controlled Vital Signs Assessment: post-procedure vital signs reviewed and stable Respiratory status: spontaneous breathing, nonlabored ventilation, respiratory function stable and patient connected to nasal cannula oxygen Cardiovascular status: blood pressure returned to baseline and stable Postop Assessment: no apparent nausea or vomiting Anesthetic complications: no    Last Vitals:  Vitals:   08/04/17 1709 08/04/17 1713  BP: 113/79 105/60  Pulse: (!) 101 98  Resp: (!) 21 (!) 23  Temp:  36.7 C  SpO2: 97% 99%    Last Pain:  Vitals:   08/04/17 1713  TempSrc:   PainSc: 0-No pain                 Shelton Silvas

## 2017-08-04 NOTE — Anesthesia Procedure Notes (Signed)
Procedure Name: LMA Insertion Date/Time: 08/04/2017 4:11 PM Performed by: Rosiland Oz, CRNA Pre-anesthesia Checklist: Patient identified, Emergency Drugs available, Suction available, Patient being monitored and Timeout performed Patient Re-evaluated:Patient Re-evaluated prior to induction Oxygen Delivery Method: Circle system utilized Preoxygenation: Pre-oxygenation with 100% oxygen Induction Type: IV induction LMA: LMA inserted LMA Size: 3.0 Number of attempts: 1 Placement Confirmation: positive ETCO2 and breath sounds checked- equal and bilateral Tube secured with: Tape Dental Injury: Teeth and Oropharynx as per pre-operative assessment

## 2017-08-04 NOTE — Op Note (Signed)
[id number] 

## 2017-08-04 NOTE — Progress Notes (Signed)
Patient wheeled out for discharge, ride no where to be found despite patient telling me ride was outside. Came back to PACU to make phone call to ride again, pt states he is 10 minutes away. Will proceed with discharge instructions upon his arrival

## 2017-08-04 NOTE — ED Notes (Signed)
Consent signed. Pt undressing at this time. Belongings being sent with pt friend.

## 2017-08-04 NOTE — ED Provider Notes (Signed)
[facility] EMERGENCY DEPARTMENT Provider Note   CSN: [id number] Arrival date & time: 08/04/17  0093     History   Chief Complaint Chief Complaint  Patient presents with  . cat bite/infection    HPI [name] is a 72 y.o. female.  Patient with history of hypertension presents the emergency department today with acute onset of left hand swelling, worsening today.  Patient was bitten by a known cat 2 days ago.  She reports subjective fever.  She has increasing difficulty moving her hands and fingers.  Patient has an occult injury to her left arm and hand and limited function at baseline.  History of tendon transplant in 1968.  She has some left axilla pain.  No nausea or vomiting.  Tetanus is up-to-date.  Cat has been quarantined by animal control.  No treatments prior to arrival.     Past Medical History:  Diagnosis Date  . Allergy   . Asthma   . Chronic headaches   . Endometriosis   . Heart attack (HCC)   . Hypertension   . Mood swings     Patient Active Problem List   Diagnosis Date Noted  . MDD (major depressive disorder) 07/31/2017  . Insomnia 02/23/2015  . OSA (obstructive sleep apnea) 02/23/2015  . Asthma 02/23/2015  . Chest pain on exertion 04/10/2013  . Menopausal hot flushes 01/22/2013  . Frequency of urination 01/22/2013    Past Surgical History:  Procedure Laterality Date  . ABDOMINAL HYSTERECTOMY    . EXPLORATORY LAPAROTOMY    . LEFT HEART CATHETERIZATION WITH CORONARY ANGIOGRAM N/A 04/10/2013   Procedure: LEFT HEART CATHETERIZATION WITH CORONARY ANGIOGRAM;  Surgeon: Ricki Rodriguez, MD;  Location: [facility] CATH LAB;  Service: Cardiovascular;  Laterality: N/A;     OB History   None      Home Medications    Prior to Admission medications   Medication Sig Start Date End Date Taking? Authorizing Provider  amLODipine (NORVASC) 2.5 MG tablet Take 2.5 mg by mouth at bedtime.     [provider]  aspirin EC 325 MG tablet  Take 325 mg by mouth daily as needed for mild pain.    [provider]  beclomethasone (QVAR) 80 MCG/ACT inhaler Inhale 1 puff into the lungs as needed (for shortness of breath).     [provider]  calcium-vitamin D (OSCAL WITH D) 500-200 MG-UNIT tablet Take 1 tablet by mouth daily with breakfast.     [provider]  estradiol (VIVELLE-DOT) 0.05 MG/24HR patch Place 1 patch (0.05 mg total) onto the skin once a week. Patient not taking: Reported on 07/30/2017 01/22/13   Brock Bad, MD  Fluticasone-Salmeterol (ADVAIR) 100-50 MCG/DOSE AEPB Inhale 1 puff into the lungs 2 (two) times daily.    [provider]  nitroGLYCERIN (NITROSTAT) 0.4 MG SL tablet Place 0.4 mg under the tongue every 5 (five) minutes as needed for chest pain.    [provider]  omeprazole (PRILOSEC) 40 MG capsule Take 40 mg by mouth daily.    [provider]    Family History Family History  Problem Relation Age of Onset  . Cancer Mother   . Heart disease Mother   . Cancer Father   . Heart disease Father     Social History Social History   Tobacco Use  . Smoking status: Former Games developer  . Smokeless tobacco: Never Used  Substance Use Topics  . Alcohol use: Yes    Comment:  occasional  . Drug use: No     Allergies   Patient has no known allergies.   Review of Systems Review of Systems  Constitutional: Negative for fever.  HENT: Negative for rhinorrhea and sore throat.   Eyes: Negative for redness.  Respiratory: Negative for cough and shortness of breath.   Cardiovascular: Negative for chest pain.  Gastrointestinal: Negative for abdominal pain, diarrhea, nausea and vomiting.  Genitourinary: Negative for dysuria.  Musculoskeletal: Positive for arthralgias and myalgias. Negative for back pain, joint swelling and neck pain.  Skin: Negative for rash and wound.  Neurological: Negative for weakness, numbness and headaches.     Physical Exam Updated  Vital Signs BP 121/78   Pulse 100   Temp 97.8 F (36.6 C) (Oral)   Resp 18   SpO2 100%   Physical Exam  Constitutional: She appears well-developed and well-nourished.  HENT:  Head: Normocephalic and atraumatic.  Eyes: Conjunctivae are normal. Right eye exhibits no discharge. Left eye exhibits no discharge.  Neck: Normal range of motion. Neck supple.  Cardiovascular: Normal rate, regular rhythm and normal heart sounds.  Pulmonary/Chest: Effort normal and breath sounds normal.  Abdominal: Soft. There is no tenderness.  Musculoskeletal: She exhibits edema and tenderness.  Patient with a small pustule at the dorsal/ulnar base of the left index finger.  There is swelling and erythema volarly and dorsally with involvement of the index finger and long finger.  Patient with pain with passive range of motion of the index finger.  Swelling extends to the wrist.  Neurological: She is alert.  Skin: Skin is warm and dry.  Psychiatric: She has a normal mood and affect.  Nursing note and vitals reviewed.          ED Treatments / Results  Labs (all labs ordered are listed, but only abnormal results are displayed) Labs Reviewed  COMPREHENSIVE METABOLIC PANEL - Abnormal; Notable for the following components:      Result Value   Potassium 3.4 (*)    CO2 17 (*)    Creatinine, Ser 1.13 (*)    Calcium 8.7 (*)    GFR calc non Af Amer 48 (*)    GFR calc Af Amer 56 (*)    All other components within normal limits  CBC WITH DIFFERENTIAL/PLATELET - Abnormal; Notable for the following components:   HCT 35.2 (*)    All other components within normal limits    EKG None  Radiology Dg Hand Complete Left  Result Date: 08/04/2017 CLINICAL DATA:  Patient reports posterior cat bite on left hand X 3 days ago, left hand swelling and pain. Unable to remove ring, patient had accident with left hand when she was younger reports unable to fully straighten left hand. EXAM: LEFT HAND - COMPLETE 3+ VIEW  COMPARISON:  None. FINDINGS: Chronic deformity involving the left fifth phalanx. No acute fracture or dislocation. Dorsal soft tissue swelling is moderate. No soft tissue gas or radiopaque foreign object. 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 IMPRESSION: Soft tissue swelling, without acute osseous abnormality. Electronically Signed   By: Jeronimo Greaves M.D.   On: 08/04/2017 12:31    Procedures Procedures (including critical care time)  Medications Ordered in ED Medications  0.9 % irrigation (POUR BTL) (1,000 mLs Irrigation Given 08/04/17 1523)  lactated ringers infusion ( Intravenous New Bag/Given 08/04/17 1547)  Ampicillin-Sulbactam (UNASYN) 3 g in sodium chloride 0.9 % 100 mL IVPB (0 g Intravenous Stopped 08/04/17 1504)  Initial Impression / Assessment and Plan / ED Course  I have reviewed the triage vital signs and the nursing notes.  Pertinent labs & imaging results that were available during my care of the patient were reviewed by me and considered in my medical decision making (see chart for details).     Patient seen and examined. Work-up initiated. Medications ordered. Discussed with and seen by Dr. Ethelda Chick. Will call hand for reccs.   Vital signs reviewed and are as follows: BP 121/78   Pulse 100   Temp 97.8 F (36.6 C) (Oral)   Resp 18   SpO2 100%   Ring removed by ED staff.  Spoke with Dr. Merlyn Lot who will admit for incision and drainage of likely flexor tenosynovitis.  Patient has received Unasyn.  Tetanus is up-to-date.  Patient and husband updated.  Aware of plan.  Final Clinical Impressions(s) / ED Diagnoses   Final diagnoses:  Cat bite of left hand including fingers with infection, initial encounter   Infection of hand from cat bite requiring surgical I&D and debridement.  Patient will be admitted to the OR into the care of Dr. Merlyn Lot.  ED Discharge Orders    None       Renne Crigler,  Cordelia Poche 08/04/17 1618    Doug Sou, MD 08/04/17 [phone]

## 2017-08-04 NOTE — H&P (Addendum)
[name] is an 72 y.o. female.   Chief Complaint: left hand infected cat bite HPI: 72 yo female present with her neighbor.  She states she was bitten by neighbors cat 2 days ago on left index finger and hand.  Has had progressive swelling and erythema.  Hand feels hot.  Minimal pain at this time.  She has been taking aspirin.  No alleviating or aggravating factors.  Has had fevers and chills, but not today.  Case discussed with Carlisle Cater, [facility] and his note from 08/04/2017 reviewed. Xrays viewed and interpreted by me: 3 views left hand show no fracture dislocation or radiopaque foreign body. Labs reviewed: WBC 8.2  Allergies: No Known Allergies  Past Medical History:  Diagnosis Date  . Allergy   . Asthma   . Chronic headaches   . Endometriosis   . Heart attack ([place])   . Hypertension   . Mood swings     Past Surgical History:  Procedure Laterality Date  . ABDOMINAL HYSTERECTOMY    . EXPLORATORY LAPAROTOMY    . LEFT HEART CATHETERIZATION WITH CORONARY ANGIOGRAM N/A 04/10/2013   Procedure: LEFT HEART CATHETERIZATION WITH CORONARY ANGIOGRAM;  Surgeon: Birdie Riddle, MD;  Location: [facility] CATH LAB;  Service: Cardiovascular;  Laterality: N/A;    Family History: Family History  Problem Relation Age of Onset  . Cancer Mother   . Heart disease Mother   . Cancer Father   . Heart disease Father     Social History:   reports that she has quit smoking. She has never used smokeless tobacco. She reports that she drinks alcohol. She reports that she does not use drugs.  Medications:  (Not in a hospital admission)  Results for orders placed or performed during the hospital encounter of 08/04/17 (from the past 48 hour(s))  Comprehensive metabolic panel     Status: Abnormal   Collection Time: 08/04/17  9:31 AM  Result Value Ref Range   Sodium 136 135 - 145 mmol/L   Potassium 3.4 (L) 3.5 - 5.1 mmol/L   Chloride 106 101 - 111 mmol/L   CO2 17 (L) 22 - 32 mmol/L   Glucose, Bld 82  65 - 99 mg/dL   BUN 16 6 - 20 mg/dL   Creatinine, Ser 1.13 (H) 0.44 - 1.00 mg/dL   Calcium 8.7 (L) 8.9 - 10.3 mg/dL   Total Protein 7.1 6.5 - 8.1 g/dL   Albumin 3.6 3.5 - 5.0 g/dL   AST 19 15 - 41 U/L   ALT 14 14 - 54 U/L   Alkaline Phosphatase 74 38 - 126 U/L   Total Bilirubin 0.7 0.3 - 1.2 mg/dL   GFR calc non Af Amer 48 (L) >60 mL/min   GFR calc Af Amer 56 (L) >60 mL/min    Comment: (NOTE) The eGFR has been calculated using the CKD EPI equation. This calculation has not been validated in all clinical situations. eGFR's persistently <60 mL/min signify possible Chronic Kidney Disease.    Anion gap 13 5 - 15    Comment: Performed at [facility] [address], [place], [place] [address]  CBC with Differential     Status: Abnormal   Collection Time: 08/04/17  9:31 AM  Result Value Ref Range   WBC 8.2 4.0 - 10.5 K/uL   RBC 3.87 3.87 - 5.11 MIL/uL   Hemoglobin 12.2 12.0 - 15.0 g/dL   HCT 35.2 (L) 36.0 - 46.0 %   MCV 91.0 78.0 -  100.0 fL   MCH 31.5 26.0 - 34.0 pg   MCHC 34.7 30.0 - 36.0 g/dL   RDW 14.2 11.5 - 15.5 %   Platelets 249 150 - 400 K/uL   Neutrophils Relative % 72 %   Neutro Abs 5.9 1.7 - 7.7 K/uL   Lymphocytes Relative 21 %   Lymphs Abs 1.7 0.7 - 4.0 K/uL   Monocytes Relative 6 %   Monocytes Absolute 0.5 0.1 - 1.0 K/uL   Eosinophils Relative 1 %   Eosinophils Absolute 0.1 0.0 - 0.7 K/uL   Basophils Relative 0 %   Basophils Absolute 0.0 0.0 - 0.1 K/uL    Comment: Performed at [facility] [address] [place], [place] [address]    Dg Hand Complete Left  Result Date: 08/04/2017 CLINICAL DATA:  Patient reports posterior cat bite on left hand X 3 days ago, left hand swelling and pain. Unable to remove ring, patient had accident with left hand when she was younger reports unable to fully straighten left hand. EXAM: LEFT HAND - COMPLETE 3+ VIEW COMPARISON:  None. FINDINGS: Chronic deformity involving the left fifth phalanx. No acute fracture or  dislocation. Dorsal soft tissue swelling is moderate. No soft tissue gas or radiopaque foreign object. 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 IMPRESSION: Soft tissue swelling, without acute osseous abnormality. Electronically Signed   By: Abigail Miyamoto M.D.   On: 08/04/2017 12:31     A comprehensive review of systems was negative except for: Constitutional: positive for chills and fevers Respiratory: positive for Shortness of breath Cardiovascular: positive for chest pain GI: Nausea Neurological: positive for headaches Review of Systems: No vomiting, diarrhea, constipation, easy bleeding or bruising, dizziness, vision changes, fainting.   Blood pressure 95/63, pulse 80, temperature 98.5 F (36.9 C), temperature source Oral, resp. rate 16, SpO2 97 %.  General appearance: alert, cooperative and appears stated age Head: Normocephalic, without obvious abnormality, atraumatic Neck: supple, symmetrical, trachea midline Resp: clear to auscultation bilaterally Cardio: regular rate and rhythm Extremities: Intact sensation and capillary refill all digits.  +epl/fpl/io.  The left hand is swollen.  There is erythema over the index finger and into the palm over the index finger MP joint.  There is a bite wound on the dorsum of the hand over the proximal falx of the index finger bite wound at the ulnar side of the index finger near the PIP joint and to bite wounds near the MP joint.  She is tender in the index finger both volarly and dorsally.  She is not tender in the other digits.  She is not tender at the wrist. Pulses: 2+ and symmetric Skin: Skin color, texture, turgor normal. No rashes or lesions Neurologic: Grossly normal Incision/Wound: As above  Assessment/Plan Left hand infected cat bite with possible index finger flexor tenosynovitis.  I recommended incision and drainage in the operating room.  This would include the flexor sheath.   Risks, benefits and alternatives of surgery were discussed including risks of blood loss, infection, damage to nerves/vessels/tendons/ligament/bone, failure of surgery, need for additional surgery, complication with wound healing, nonunion, malunion, stiffness.  She voiced understanding of these risks and elected to proceed.   Sharaine Delange R 08/04/2017, 3:09 PM

## 2017-08-04 NOTE — Progress Notes (Signed)
Still awaiting ride home. Patient just spoke with niece who states she is " on her way". Will go over AVS discharge  paperwork upon her arrival

## 2017-08-05 ENCOUNTER — Encounter (HOSPITAL_COMMUNITY): Payer: Self-pay | Admitting: Orthopedic Surgery

## 2017-08-06 DIAGNOSIS — M79645 Pain in left finger(s): Secondary | ICD-10-CM | POA: Insufficient documentation

## 2017-08-06 DIAGNOSIS — S61402D Unspecified open wound of left hand, subsequent encounter: Secondary | ICD-10-CM | POA: Insufficient documentation

## 2017-08-06 DIAGNOSIS — M6510 Other infective (teno)synovitis, unspecified site: Secondary | ICD-10-CM | POA: Insufficient documentation

## 2017-08-09 LAB — AEROBIC/ANAEROBIC CULTURE W GRAM STAIN (SURGICAL/DEEP WOUND)

## 2017-08-09 LAB — AEROBIC/ANAEROBIC CULTURE (SURGICAL/DEEP WOUND)

## 2017-09-03 ENCOUNTER — Encounter (HOSPITAL_COMMUNITY): Payer: Self-pay | Admitting: Emergency Medicine

## 2017-09-03 ENCOUNTER — Ambulatory Visit (HOSPITAL_COMMUNITY)
Admission: EM | Admit: 2017-09-03 | Discharge: 2017-09-03 | Disposition: A | Discharge status: Home / Self Care | Payer: Medicare Other | Attending: Urgent Care | Admitting: Urgent Care

## 2017-09-03 DIAGNOSIS — K5900 Constipation, unspecified: Secondary | ICD-10-CM

## 2017-09-03 DIAGNOSIS — R110 Nausea: Secondary | ICD-10-CM | POA: Diagnosis not present

## 2017-09-03 DIAGNOSIS — E860 Dehydration: Secondary | ICD-10-CM

## 2017-09-03 DIAGNOSIS — A084 Viral intestinal infection, unspecified: Secondary | ICD-10-CM

## 2017-09-03 DIAGNOSIS — R51 Headache: Secondary | ICD-10-CM

## 2017-09-03 DIAGNOSIS — R112 Nausea with vomiting, unspecified: Secondary | ICD-10-CM

## 2017-09-03 DIAGNOSIS — R519 Headache, unspecified: Secondary | ICD-10-CM

## 2017-09-03 MED ORDER — ONDANSETRON 8 MG PO TBDP: 8.0000 mg | ORAL_TABLET | Freq: Three times a day (TID) | ORAL | 0 refills | Status: DC | PRN

## 2017-09-03 NOTE — ED Provider Notes (Addendum)
MRN: [id number] DOB: [birth year]  Subjective:   [name] is a 72 y.o. female presenting for 2-day history of nausea with vomiting, headaches, upset stomach.  She also has had general malaise, difficulty with constipation.  Her last bowel movement was today.  She denies dizziness, abdominal pain, confusion, dysuria, hematuria, urinary frequency, chest pain, myalgia, rashes.  Patient states that she cannot recall eating any questionable foods, unfiltered water or uncooked meats.  Patient has not tried any medications for relief.  She states that her vomiting has slowed down today.  She does have a PCP and plans on checking in with them tomorrow.  She does have a history of abdominal surgery, including abdominal hysterectomy and exploratory laparotomy.  Denies history of small bowel obstruction.  She states that she is passing gas and having bowel movements, just having intermittent constipation.  No current facility-administered medications for this encounter.   Current Outpatient Medications:  .  amLODipine (NORVASC) 2.5 MG tablet, Take 2.5 mg by mouth at bedtime. , Disp: , Rfl:  .  amoxicillin-clavulanate (AUGMENTIN) 875-125 MG tablet, Take 1 tablet by mouth 2 (two) times daily., Disp: 14 tablet, Rfl: 0 .  aspirin EC 325 MG tablet, Take 325 mg by mouth daily as needed for mild pain., Disp: , Rfl:  .  beclomethasone (QVAR) 80 MCG/ACT inhaler, Inhale 1 puff into the lungs as needed (for shortness of breath). , Disp: , Rfl:  .  calcium-vitamin D (OSCAL WITH D) 500-200 MG-UNIT tablet, Take 1 tablet by mouth daily with breakfast. , Disp: , Rfl:  .  estradiol (VIVELLE-DOT) 0.05 MG/24HR patch, Place 1 patch (0.05 mg total) onto the skin once a week. (Patient not taking: Reported on 07/30/2017), Disp: 8 patch, Rfl: 12 .  Fluticasone-Salmeterol (ADVAIR) 100-50 MCG/DOSE AEPB, Inhale 1 puff into the lungs 2 (two) times daily., Disp: , Rfl:  .  HYDROcodone-acetaminophen (NORCO) 5-325 MG tablet, 1-2 tabs  po q6 hours prn pain, Disp: 20 tablet, Rfl: 0 .  Magnesium 500 MG TABS, Take 3 tablets by mouth daily., Disp: , Rfl:  .  nitroGLYCERIN (NITROSTAT) 0.4 MG SL tablet, Place 0.4 mg under the tongue every 5 (five) minutes as needed for chest pain., Disp: , Rfl:  .  omeprazole (PRILOSEC) 40 MG capsule, Take 40 mg by mouth daily., Disp: , Rfl:  .  Potassium (POTASSIMIN PO), Take 3 tablets by mouth daily., Disp: , Rfl:    No Known Allergies  Past Medical History:  Diagnosis Date  . Allergy   . Asthma   . Chronic headaches   . Endometriosis   . Heart attack (HCC)   . Hypertension   . Mood swings      Past Surgical History:  Procedure Laterality Date  . ABDOMINAL HYSTERECTOMY    . EXPLORATORY LAPAROTOMY    . I&D EXTREMITY Left 08/04/2017   Procedure: IRRIGATION AND DEBRIDEMENT HAND;  Surgeon: Betha Loa, MD;  Location: [facility] OR;  Service: Orthopedics;  Laterality: Left;  . LEFT HEART CATHETERIZATION WITH CORONARY ANGIOGRAM N/A 04/10/2013   Procedure: LEFT HEART CATHETERIZATION WITH CORONARY ANGIOGRAM;  Surgeon: Ricki Rodriguez, MD;  Location: [facility] CATH LAB;  Service: Cardiovascular;  Laterality: N/A;    Family History  Problem Relation Age of Onset  . Cancer Mother   . Heart disease Mother   . Cancer Father   . Heart disease Father      Objective:   Vitals: BP (!) 155/88 (BP Location: Left Arm)   Pulse 81  Temp 98 F (36.7 C) (Oral)   Resp 16   SpO2 100%   Physical Exam  Constitutional: She is oriented to person, place, and time. She appears well-developed and well-nourished.  HENT:  Mucous membranes dry.  Eyes: Pupils are equal, round, and reactive to light. EOM are normal. Right eye exhibits no discharge. Left eye exhibits no discharge. No scleral icterus.  Neck: Normal range of motion. Neck supple. No thyromegaly present.  Cardiovascular: Normal rate, regular rhythm and intact distal pulses. Exam reveals no gallop and no friction rub.  No murmur heard. Pulmonary/Chest: No  stridor. No respiratory distress. She has no wheezes. She has no rales. She exhibits no tenderness.  Abdominal: Soft. Bowel sounds are normal. She exhibits no distension and no mass. There is no tenderness. There is no rebound and no guarding.  Neurological: She is alert and oriented to person, place, and time. She displays normal reflexes. No cranial nerve deficit. Coordination normal.  Skin: Skin is warm and dry.  Psychiatric: She has a normal mood and affect.   Assessment and Plan :   Nausea  Non-intractable vomiting with nausea, unspecified vomiting type  Generalized headache  Constipation, unspecified constipation type  Viral gastroenteritis  Dehydration  We will manage patient supportively for viral gastroenteritis.  Also counseled patient on management of her constipation.  Recommended she follow-up with her PCP to discuss possibility of imaging for her belly bloating and relatively new onset of constipation.  Patient will use Zofran in the meantime for her nausea and vomiting.  ER and return to clinic precautions discussed.   Wallis Bamberg, PA-C 09/03/17 1323

## 2017-09-03 NOTE — Discharge Instructions (Addendum)
Please use Miralax for moderate to severe constipation. Take this once a day for the next 2-3 days. Please also start Colace (docusate) 50mg  stool softener, twice a day for at least 1 week. If stools become loose, cut down to once a day for another week. If stools remain loose, cut back to 1 pill every other day for a third week. You can stop docusate thereafter and resume as needed for constipation.  To help reduce constipation and promote bowel health: 1. Drink at least 64 ounces of water each day 2. Eat plenty of fiber (fruits, vegetables, whole grains, legumes) 3. Be physically active or exercise including walking, jogging, swimming, yoga, etc. 4. For active constipation use a stool softener (docusate) or an osmotic laxative (like Miralax) each day, or as needed.

## 2017-09-03 NOTE — ED Triage Notes (Signed)
Pt here for nausea and HA; pt sts pain in right leg

## 2017-09-20 ENCOUNTER — Encounter: Payer: Self-pay | Admitting: Allergy & Immunology

## 2017-09-20 ENCOUNTER — Ambulatory Visit (INDEPENDENT_AMBULATORY_CARE_PROVIDER_SITE_OTHER): Payer: Medicare Other | Admitting: Allergy & Immunology

## 2017-09-20 VITALS — BP 124/70 | HR 100 | Temp 98.0°F | Resp 18 | Ht 59.0 in | Wt 170.0 lb

## 2017-09-20 DIAGNOSIS — J3089 Other allergic rhinitis: Secondary | ICD-10-CM

## 2017-09-20 DIAGNOSIS — J4541 Moderate persistent asthma with (acute) exacerbation: Secondary | ICD-10-CM

## 2017-09-20 DIAGNOSIS — J0100 Acute maxillary sinusitis, unspecified: Secondary | ICD-10-CM

## 2017-09-20 MED ORDER — BUDESONIDE-FORMOTEROL FUMARATE 160-4.5 MCG/ACT IN AERO: 2.0000 | INHALATION_SPRAY | Freq: Two times a day (BID) | RESPIRATORY_TRACT | 5 refills | Status: DC

## 2017-09-20 MED ORDER — FLUTICASONE PROPIONATE 50 MCG/ACT NA SUSP: 2.0000 | Freq: Every day | NASAL | 5 refills | Status: DC

## 2017-09-20 MED ORDER — ALBUTEROL SULFATE HFA 108 (90 BASE) MCG/ACT IN AERS: 2.0000 | INHALATION_SPRAY | RESPIRATORY_TRACT | 1 refills | Status: DC | PRN

## 2017-09-20 MED ORDER — AMOXICILLIN-POT CLAVULANATE 875-125 MG PO TABS: 1.0000 | ORAL_TABLET | Freq: Two times a day (BID) | ORAL | 0 refills | Status: AC

## 2017-09-20 NOTE — Patient Instructions (Addendum)
1. Moderate persistent asthma with acute exacerbation - Lung testing looks slightly lower, but it did improve with the albuterol treatment. - Since you are using an albuterol inhaler every month, I think you need to be on a controller medication. - Spacer sample and demonstration provided. - Daily controller medication(s): Symbicort 160/4.5mcg two puffs twice daily with spacer - Prior to physical activity: ProAir 2 puffs 10-15 minutes before physical activity. - Rescue medications: ProAir 4 puffs every 4-6 hours as needed or albuterol nebulizer one vial every 4-6 hours as needed - Asthma control goals:  * Full participation in all desired activities (may need albuterol before activity) * Albuterol use two time or less a week on average (not counting use with activity) * Cough interfering with sleep two time or less a month * Oral steroids no more than once a year * No hospitalizations  2. Chronic rhinitis - We will get your chart from Dr. Willa Rough before we do any more testing at this time.  - Start taking: Zyrtec (cetirizine) 10mg  tablet once daily and Flonase (fluticasone) two sprays per nostril daily - You can use an extra dose of the antihistamine, if needed, for breakthrough symptoms.  - Consider nasal saline rinses 1-2 times daily to remove allergens from the nasal cavities as well as help with mucous clearance (this is especially helpful to do before the nasal sprays are given) - Consider allergy shots as a means of long-term control. - Allergy shots "re-train" and "reset" the immune system to ignore environmental allergens and decrease the resulting immune response to those allergens (sneezing, itchy watery eyes, runny nose, nasal congestion, etc).    - Allergy shots improve symptoms in 75-85% of patients.  - We can discuss more at the next appointment if the medications are not working for you.  3. Acute non-recurrent maxillary sinusitis - With your current symptoms and time course,  antibiotics are needed: Augmentin 875mg  twice daily for 10 days - Add on nasal saline spray (i.e., Simply Saline) or nasal saline lavage (i.e., NeilMed) as needed prior to medicated nasal sprays. - For thick post nasal drainage, add guaifenesin [phone] mg (Mucinex) twice daily as needed for mucous thinning with adequate hydration to help it work.   4. Return in about 2 months (around 11/20/2017).  Please inform us of any Emergency Department visits, hospitalizations, or changes in symptoms. Call us before going to the ED for breathing or allergy symptoms since we might be able to fit you in for a sick visit. Feel free to contact us anytime with any questions, problems, or concerns.  It was a pleasure to meet you today!  Websites that have reliable patient information: 1. American Academy of Asthma, Allergy, and Immunology: www.aaaai.org 2. Food Allergy Research and Education (FARE): foodallergy.org 3. Mothers of Asthmatics: http://www.asthmacommunitynetwork.org 4. American College of Allergy, Asthma, and Immunology: www.acaai.org

## 2017-09-20 NOTE — Progress Notes (Signed)
NEW PATIENT  Date of Service/Encounter:  09/20/17  Referring provider: Jackie Plum, MD   Assessment:   Moderate persistent asthma with acute exacerbation  Perennial allergic rhinitis (outdoor molds)  Acute non-recurrent maxillary sinusitis   Asthma Reportables:  Severity: moderate persistent  Risk: high Control: not well controlled   Plan/Recommendations:   1. Moderate persistent asthma with acute exacerbation - Lung testing looks slightly lower, but it did improve with the albuterol treatment. - Since you are using an albuterol inhaler every month, I think you need to be on a controller medication. - Spacer sample and demonstration provided. - Daily controller medication(s): Symbicort 160/4.93mcg two puffs twice daily with spacer - Prior to physical activity: ProAir 2 puffs 10-15 minutes before physical activity. - Rescue medications: ProAir 4 puffs every 4-6 hours as needed or albuterol nebulizer one vial every 4-6 hours as needed - Asthma control goals:  * Full participation in all desired activities (may need albuterol before activity) * Albuterol use two time or less a week on average (not counting use with activity) * Cough interfering with sleep two time or less a month * Oral steroids no more than once a year * No hospitalizations  2. Chronic rhinitis - We will get your chart from Dr. Willa Rough before we do any more testing at this time.  - Start taking: Zyrtec (cetirizine) 10mg  tablet once daily and Flonase (fluticasone) two sprays per nostril daily - You can use an extra dose of the antihistamine, if needed, for breakthrough symptoms.  - Consider nasal saline rinses 1-2 times daily to remove allergens from the nasal cavities as well as help with mucous clearance (this is especially helpful to do before the nasal sprays are given) - Consider allergy shots as a means of long-term control. - Allergy shots "re-train" and "reset" the immune system to ignore  environmental allergens and decrease the resulting immune response to those allergens (sneezing, itchy watery eyes, runny nose, nasal congestion, etc).    - Allergy shots improve symptoms in 75-85% of patients.  - We can discuss more at the next appointment if the medications are not working for you.  3. Acute non-recurrent maxillary sinusitis - With your current symptoms and time course, antibiotics are needed: Augmentin 875mg  twice daily for 10 days - Add on nasal saline spray (i.e., Simply Saline) or nasal saline lavage (i.e., NeilMed) as needed prior to medicated nasal sprays. - For thick post nasal drainage, add guaifenesin [phone] mg (Mucinex) twice daily as needed for mucous thinning with adequate hydration to help it work.   4. Return in about 2 months (around 11/20/2017).  Subjective:   [name] is a 72 y.o. female presenting today for evaluation of  Chief Complaint  Patient presents with  . Asthma  . Wheezing  . Cough  . Breathing Problem    [name] has a history of the following: Patient Active Problem List   Diagnosis Date Noted  . Perennial allergic rhinitis 09/20/2017  . MDD (major depressive disorder) 07/31/2017  . Insomnia 02/23/2015  . OSA (obstructive sleep apnea) 02/23/2015  . Asthma 02/23/2015  . Chest pain on exertion 04/10/2013  . Menopausal hot flushes 01/22/2013  . Frequency of urination 01/22/2013    History obtained from: chart review and patient.  [name] was referred by Jackie Plum, MD.     [name] is a 72 y.o. female presenting to re-establish care. She was last seen in February 2016 for a sick visit by  Dr. Nunzio Cobbs.  At that time, she had run out of all of her controller medications including albuterol and Qvar.  She was restarted on all of her medications and given a prednisone burst. She seems to have followed up on multiple occasions per review of her chart for sick visits. Dr. Willa Rough seems to have been her main  physician. Her last skin testing was done in 2008 and was positive to mold mix #4, but otherwise negative.  [name] was diagnosed in 2003. She is on albuterol as needed. She has been on Advair in the past, by Dr. Willa Rough, but she just stopped it because she was feeling well. Around three months ago, she was exposed to a friend with pneumonia and has had worsening problems since that time. She was exposed to a sick contact two weeks ago and she is feeling worse now.  She is not on medications at this time. She was on prednisone a while ago. She has had chills, fever, nausea, and vomiting since last week. She went to see her PCP where she was diagnosed with a stomach virus. She is coughing at night.   Prior to this, she uses an albuterol inhaler every month. She never needed a controller medication in the interim. She does remember that she was on Advair at some point, but otherwise she does not remember what drugs she was taking. She did have a CXR in March 2019 that was normal.   She does have GERD and is on Nexium. Rhinitis is controlled with the PRN use of nasal sprays. She thinks the current weather has been irritating her allergy symptoms. Otherwise, there is no history of other atopic diseases, including drug allergies, food allergies, stinging insect allergies, or urticaria. There is no significant infectious history. Vaccinations are up to date.    Past Medical History: Patient Active Problem List   Diagnosis Date Noted  . Perennial allergic rhinitis 09/20/2017  . MDD (major depressive disorder) 07/31/2017  . Insomnia 02/23/2015  . OSA (obstructive sleep apnea) 02/23/2015  . Asthma 02/23/2015  . Chest pain on exertion 04/10/2013  . Menopausal hot flushes 01/22/2013  . Frequency of urination 01/22/2013    Medication List:  Allergies as of 09/20/2017   No Known Allergies     Medication List        Accurate as of 09/20/17 11:21 PM. Always use your most recent med list.            albuterol 108 (90 Base) MCG/ACT inhaler Commonly known as:  PROAIR HFA Inhale 2 puffs into the lungs every 4 (four) hours as needed for wheezing or shortness of breath.   amoxicillin-clavulanate 875-125 MG tablet Commonly known as:  AUGMENTIN Take 1 tablet by mouth 2 (two) times daily for 10 days.   aspirin EC 325 MG tablet Take 325 mg by mouth daily as needed for mild pain.   budesonide-formoterol 160-4.5 MCG/ACT inhaler Commonly known as:  SYMBICORT Inhale 2 puffs into the lungs 2 (two) times daily.   fluticasone 50 MCG/ACT nasal spray Commonly known as:  FLONASE Place 2 sprays into both nostrils daily.   nitroGLYCERIN 0.4 MG SL tablet Commonly known as:  NITROSTAT Place 0.4 mg under the tongue every 5 (five) minutes as needed for chest pain.       Birth History: non-contributory. Born at term without complications.   Developmental History: non-contributory.   Past Surgical History: Past Surgical History:  Procedure Laterality Date  . ABDOMINAL HYSTERECTOMY    .  EXPLORATORY LAPAROTOMY    . I&D EXTREMITY Left 08/04/2017   Procedure: IRRIGATION AND DEBRIDEMENT HAND;  Surgeon: Betha Loa, MD;  Location: [facility] OR;  Service: Orthopedics;  Laterality: Left;  . LEFT HEART CATHETERIZATION WITH CORONARY ANGIOGRAM N/A 04/10/2013   Procedure: LEFT HEART CATHETERIZATION WITH CORONARY ANGIOGRAM;  Surgeon: Ricki Rodriguez, MD;  Location: [facility] CATH LAB;  Service: Cardiovascular;  Laterality: N/A;     Family History: Family History  Problem Relation Age of Onset  . Cancer Mother   . Heart disease Mother   . Cancer Father   . Heart disease Father      Social History: Nalia lives at home by herself.  She lives in an apartment built in 1958.  She has carpeting throughout her home.  She has electric heating and central cooling.  There are cats indoors and outdoors of the home.  There are no dust mite covers on her bedding.  There is no tobacco exposure.  She smoked from [place] through  1973.    Review of Systems: a 14-point review of systems is pertinent for what is mentioned in HPI.  Otherwise, all other systems were negative. Constitutional: negative other than that listed in the HPI Eyes: negative other than that listed in the HPI Ears, nose, mouth, throat, and face: negative other than that listed in the HPI Respiratory: negative other than that listed in the HPI Cardiovascular: negative other than that listed in the HPI Gastrointestinal: negative other than that listed in the HPI Genitourinary: negative other than that listed in the HPI Integument: negative other than that listed in the HPI Hematologic: negative other than that listed in the HPI Musculoskeletal: negative other than that listed in the HPI Neurological: negative other than that listed in the HPI Allergy/Immunologic: negative other than that listed in the HPI    Objective:   Blood pressure 124/70, pulse 100, temperature 98 F (36.7 C), resp. rate 18, height 4\' 11"  (1.499 m), weight 170 lb (77.1 kg), SpO2 98 %. Body mass index is 34.34 kg/m.   Physical Exam:  General: Alert, interactive, in no acute distress. Very adorable elderly female.  Eyes: No conjunctival injection bilaterally, no discharge on the right, no discharge on the left and no Horner-Trantas dots present. PERRL bilaterally. EOMI without pain. No photophobia.  Ears: Right TM pearly gray with normal light reflex, Left TM pearly gray with normal light reflex, Right TM intact without perforation and Left TM intact without perforation.  Nose/Throat: External nose within normal limits, nasal crease present and septum midline. Turbinates edematous without discharge. Posterior oropharynx mildly erythematous without cobblestoning in the posterior oropharynx. Tonsils 2+ without exudates.  Tongue without thrush and Geographic tongue present. Neck: Supple without thyromegaly. Trachea midline. Adenopathy: no enlarged lymph nodes appreciated in  the anterior cervical, occipital, axillary, epitrochlear, inguinal, or popliteal regions. Lungs: Decreased breath sounds bilaterally without wheezing, rhonchi or rales. No increased work of breathing. CV: Normal S1/S2. No murmurs. Capillary refill <2 seconds.  Abdomen: Nondistended, nontender. No guarding or rebound tenderness. Bowel sounds present in all fields and hypoactive  Skin: Warm and dry, without lesions or rashes. Extremities:  No clubbing, cyanosis or edema. Neuro:   Grossly intact. No focal deficits appreciated. Responsive to questions.  Diagnostic studies:   Spirometry: results normal (FEV1: 1.47/92%, FVC: 1.70/84%, FEV1/FVC: 86%).    Spirometry consistent with normal pattern. Xopenex/Atrovent nebulizer treatment given in clinic with no improvement.  Allergy Studies: none       Malachi Bonds, MD  [facility]

## 2017-10-22 ENCOUNTER — Ambulatory Visit: Payer: Medicare Other | Admitting: Allergy & Immunology

## 2017-10-22 DIAGNOSIS — J309 Allergic rhinitis, unspecified: Secondary | ICD-10-CM

## 2017-12-05 ENCOUNTER — Ambulatory Visit (INDEPENDENT_AMBULATORY_CARE_PROVIDER_SITE_OTHER): Payer: Medicare Other | Admitting: Family Medicine

## 2017-12-05 ENCOUNTER — Encounter: Payer: Self-pay | Admitting: Family Medicine

## 2017-12-05 VITALS — BP 120/82 | HR 91 | Temp 98.3°F | Resp 20

## 2017-12-05 DIAGNOSIS — J4541 Moderate persistent asthma with (acute) exacerbation: Secondary | ICD-10-CM | POA: Insufficient documentation

## 2017-12-05 DIAGNOSIS — J3089 Other allergic rhinitis: Secondary | ICD-10-CM

## 2017-12-05 MED ORDER — BUDESONIDE-FORMOTEROL FUMARATE 160-4.5 MCG/ACT IN AERO: 2.0000 | INHALATION_SPRAY | Freq: Two times a day (BID) | RESPIRATORY_TRACT | 5 refills | Status: DC

## 2017-12-05 MED ORDER — ALBUTEROL SULFATE HFA 108 (90 BASE) MCG/ACT IN AERS: 2.0000 | INHALATION_SPRAY | RESPIRATORY_TRACT | 1 refills | Status: DC | PRN

## 2017-12-05 MED ORDER — FLUTICASONE PROPIONATE 50 MCG/ACT NA SUSP: 2.0000 | Freq: Every day | NASAL | 5 refills | Status: DC

## 2017-12-05 NOTE — Patient Instructions (Addendum)
1. Moderate persistent asthma with acute exacerbation - Lung testing looks slightly lower, but it did improve with the albuterol treatment. - Since you are using an albuterol inhaler every month, I think you need to be on a controller medication. - Spacer sample and demonstration provided. - Daily controller medication(s): Symbicort 160/4.63mcg two puffs twice daily with spacer - Prior to physical activity: ProAir 2 puffs 10-15 minutes before physical activity. - Rescue medications: ProAir 4 puffs every 4-6 hours as needed or albuterol nebulizer one vial every 4-6 hours as needed  - Prednisone 10 mg tablets. Take 2 tablets twice a day for 3 days, then take 2 tablets once a day for 1 day, then take 1 tablet once for 1 day, then stop.  - Asthma control goals:  * Full participation in all desired activities (may need albuterol before activity) * Albuterol use two time or less a week on average (not counting use with activity) * Cough interfering with sleep two time or less a month * Oral steroids no more than once a year * No hospitalizations  2. Chronic rhinitis - We will get your chart from Dr. Willa Rough before we do any more testing at this time.  - Start taking: Zyrtec (cetirizine) 10mg  tablet once daily and Flonase (fluticasone) two sprays per nostril daily, begin azelastine nasal spray 1-2 sprays twice a day to help control nasal drainage.  - You can use an extra dose of the antihistamine, if needed, for breakthrough symptoms.  - Consider nasal saline rinses 1-2 times daily to remove allergens from the nasal cavities as well as help with mucous clearance (this is especially helpful to do before the nasal sprays are given)  3. Follow up in 4 weeks or sooner if needed 4. Follow up with your cardiologist today.  Please inform us of any Emergency Department visits, hospitalizations, or changes in symptoms. Call us before going to the ED for breathing or allergy symptoms since we might be able to fit  you in for a sick visit. Feel free to contact us anytime with any questions, problems, or concerns.  It was a pleasure to meet you today!  Websites that have reliable patient information: 1. American Academy of Asthma, Allergy, and Immunology: www.aaaai.org 2. Food Allergy Research and Education (FARE): foodallergy.org 3. Mothers of Asthmatics: http://www.asthmacommunitynetwork.org 4. American College of Allergy, Asthma, and Immunology: www.acaai.org

## 2017-12-05 NOTE — Progress Notes (Signed)
[address] [place] [address] Dept: [phone]  FOLLOW UP NOTE  Patient ID: [name], female    DOB: Apr 23, 1946  Age: 72 y.o. MRN: [id number] Date of Office Visit: 12/05/2017  Assessment  Chief Complaint: Cough (2 wks)  HPI [name] is a 72 year old female who presents to the clinic for a sick visit. She was last seen in this clinic on 09/20/2017 by Dr. Dellis Anes for evaluation of asthma, chronic rhinitis, and acute sinusitis. At that time, she started Symbicort 160- 2 puffs twice a day with a spacer. She also began cetirizine and Flonase daily. She required Augmentin 875 twice a day for 10 days for resolution of acute sinusitis.   At today's visit, she reports chest tightness, shortness of breath, and cough for the last 2 weeks. She reports chest congestion for the last 1 year that has not changed with any medications. She reports that she has had a cough with clear to white mucus for the last 2 weeks, chest tightness when breathing, increased thick post nasal drainage, and increased throat clearing. She reports experiencing sharp chest pains over the suprasternal area consistently over the last 2-3 weeks. She reports she can reproduce the pain by pushing on her chest. She reports feeling occasional palpitations which are fleeting. She denies fever, sweats, and chills. She denies sick contacts.   She reports her asthma has not been well controlled for the last 2 weeks with symptoms including shortness of breath, chest tightness, wheeze, and cough that is worse with activity. She is currently using Symbicort 160- 2 puffs twice a day with a spacer and using her albuterol inhaler once a day with short term relief of symptoms.   Allergic rhinitis is reported as not well controlled with symptoms including thick post nasal drainage and throat clearing. She is not currently using cetirizine, nasal saline rinses, or Flonase nasal spray. She reports she has tried Mucinex  which has not provided any relief.   Her current medications are listed in the chart.   Drug Allergies:  No Known Allergies  Physical Exam: BP 120/82   Pulse 91   Temp 98.3 F (36.8 C) (Oral)   Resp 20   SpO2 98%    Physical Exam  Constitutional: She is oriented to person, place, and time. She appears well-developed and well-nourished.  HENT:  Head: Normocephalic.  Right Ear: External ear normal.  Left Ear: External ear normal.  Mouth/Throat: Oropharynx is clear and moist.  Bilateral nares slightly erythematous and edematous with clear nasal drainage noted. Pharynx normal. Ears normal. Eyes normal.   Eyes: Conjunctivae are normal.  Neck: Normal range of motion. Neck supple.  Cardiovascular: Normal rate.  Regularly irregular rhythm.  Pulmonary/Chest: Effort normal and breath sounds normal.  Lungs clear to auscultation  Musculoskeletal: Normal range of motion.  Neurological: She is alert and oriented to person, place, and time.  Skin: Skin is warm and dry.  Psychiatric: She has a normal mood and affect. Her behavior is normal. Judgment and thought content normal.  Vitals reviewed.   Diagnostics: FVC 1.72, FEV1 1.49. Post bronchodilator FVC 1.69, FEV1 1.48. No significant improvement post spirometry reading.   Assessment and Plan: 1. Moderate persistent asthma with acute exacerbation   2. Perennial allergic rhinitis     Meds ordered this encounter  Medications  . fluticasone (FLONASE) 50 MCG/ACT nasal spray    Sig: Place 2 sprays into both nostrils daily.    Dispense:  16 g  Refill:  5  . albuterol (PROAIR HFA) 108 (90 Base) MCG/ACT inhaler    Sig: Inhale 2 puffs into the lungs every 4 (four) hours as needed for wheezing or shortness of breath.    Dispense:  1 Inhaler    Refill:  1  . budesonide-formoterol (SYMBICORT) 160-4.5 MCG/ACT inhaler    Sig: Inhale 2 puffs into the lungs 2 (two) times daily.    Dispense:  1 Inhaler    Refill:  5    Patient  Instructions  1. Moderate persistent asthma with acute exacerbation - Lung testing looks slightly lower, but it did improve with the albuterol treatment. - Since you are using an albuterol inhaler every month, I think you need to be on a controller medication. - Spacer sample and demonstration provided. - Daily controller medication(s): Symbicort 160/4.29mcg two puffs twice daily with spacer - Prior to physical activity: ProAir 2 puffs 10-15 minutes before physical activity. - Rescue medications: ProAir 4 puffs every 4-6 hours as needed or albuterol nebulizer one vial every 4-6 hours as needed  - Prednisone 10 mg tablets. Take 2 tablets twice a day for 3 days, then take 2 tablets once a day for 1 day, then take 1 tablet once for 1 day, then stop.  - Asthma control goals:  * Full participation in all desired activities (may need albuterol before activity) * Albuterol use two time or less a week on average (not counting use with activity) * Cough interfering with sleep two time or less a month * Oral steroids no more than once a year * No hospitalizations  2. Chronic rhinitis - We will get your chart from Dr. Willa Rough before we do any more testing at this time.  - Start taking: Zyrtec (cetirizine) 10mg  tablet once daily and Flonase (fluticasone) two sprays per nostril daily, begin azelastine nasal spray 1-2 sprays twice a day to help control nasal drainage.  - You can use an extra dose of the antihistamine, if needed, for breakthrough symptoms.  - Consider nasal saline rinses 1-2 times daily to remove allergens from the nasal cavities as well as help with mucous clearance (this is especially helpful to do before the nasal sprays are given) - Consider adding Spiriva if no improvement.  3. Follow up in 4 weeks or sooner if needed.   4. Follow up with your cardiologist today.    Return in about 1 month (around 01/02/2018), or if symptoms worsen or fail to improve.    Thank you for the  opportunity to care for this patient.  Please do not hesitate to contact me with questions.  Thermon Leyland, FNP [facility]

## 2017-12-06 ENCOUNTER — Telehealth: Payer: Self-pay | Admitting: Family Medicine

## 2017-12-06 NOTE — Telephone Encounter (Signed)
Called and spoke with nurse and she stated she will fax it over. I gave her the [facility] fax number and it will be coming there.

## 2017-12-06 NOTE — Telephone Encounter (Signed)
-----   Message from Hetty Blend, FNP sent at 12/05/2017  5:00 PM EDT ----- Can you please call [facility] and have her visit from yesterday- 12/05/2017- faxed over? Can you please call me or send it to [facility] office.  Thank you.

## 2017-12-10 NOTE — Telephone Encounter (Signed)
[name] did these ever come in over in [facility]?

## 2017-12-10 NOTE — Addendum Note (Signed)
Addended by: Maryjean Morn D on: 12/10/2017 09:09 AM   Modules accepted: Orders

## 2017-12-10 NOTE — Telephone Encounter (Signed)
I have called the place again and requested those records and they stated they will fax it. They have both offices fax number and will be sending it to one if not both.

## 2017-12-13 ENCOUNTER — Ambulatory Visit: Payer: Medicare Other | Admitting: Allergy & Immunology

## 2017-12-13 DIAGNOSIS — J309 Allergic rhinitis, unspecified: Secondary | ICD-10-CM

## 2017-12-18 NOTE — Telephone Encounter (Signed)
[name] has the notes came in yet?

## 2017-12-18 NOTE — Telephone Encounter (Signed)
Called and they stated they faxed it to [facility] on the 2nd, I informed her we have not received it and gave them the [place] fax number again and she stated they will fax it right over. I called [facility] for her to be on the look out for them and to fax it to the office [name] will be at tomorrow.

## 2017-12-18 NOTE — Telephone Encounter (Signed)
I have not seen it yet.

## 2018-02-08 ENCOUNTER — Ambulatory Visit (INDEPENDENT_AMBULATORY_CARE_PROVIDER_SITE_OTHER): Payer: Medicare Other | Admitting: Allergy & Immunology

## 2018-02-08 ENCOUNTER — Encounter (INDEPENDENT_AMBULATORY_CARE_PROVIDER_SITE_OTHER): Payer: Self-pay

## 2018-02-08 ENCOUNTER — Encounter: Payer: Self-pay | Admitting: Allergy & Immunology

## 2018-02-08 ENCOUNTER — Telehealth: Payer: Self-pay | Admitting: *Deleted

## 2018-02-08 VITALS — BP 124/82 | HR 90 | Resp 20

## 2018-02-08 DIAGNOSIS — J3089 Other allergic rhinitis: Secondary | ICD-10-CM | POA: Diagnosis not present

## 2018-02-08 DIAGNOSIS — J4540 Moderate persistent asthma, uncomplicated: Secondary | ICD-10-CM | POA: Diagnosis not present

## 2018-02-08 DIAGNOSIS — K219 Gastro-esophageal reflux disease without esophagitis: Secondary | ICD-10-CM

## 2018-02-08 MED ORDER — ALBUTEROL SULFATE (2.5 MG/3ML) 0.083% IN NEBU: 2.5000 mg | INHALATION_SOLUTION | RESPIRATORY_TRACT | 1 refills | Status: DC | PRN

## 2018-02-08 NOTE — Patient Instructions (Addendum)
1. Moderate persistent asthma, uncomplicated - Lung testing looks very stable compared to your last spirometries.  - There was minimal improvement with the nebulizer treatment, so I do not think that you need prednisone today. - We did give you a nebulizer today so that you can manage some of your symptoms more effectively at home.  - We will get some lab work to see if you would qualify for an injectable asthma medication.  - Neb and teaching provided. - Daily controller medication(s): Symbicort 160/4.38mcg two puffs twice daily with spacer - Prior to physical activity: ProAir 2 puffs 10-15 minutes before physical activity. - Rescue medications: ProAir 4 puffs every 4-6 hours as needed or albuterol nebulizer one vial every 4-6 hours as needed - Asthma control goals:  * Full participation in all desired activities (may need albuterol before activity) * Albuterol use two time or less a week on average (not counting use with activity) * Cough interfering with sleep two time or less a month * Oral steroids no more than once a year * No hospitalizations  2. Chronic rhinitis - Continue taking: Zyrtec (cetirizine) 10mg  tablet once daily and Flonase (fluticasone) two sprays per nostril daily - You can use an extra dose of the antihistamine, if needed, for breakthrough symptoms.  - Consider nasal saline rinses 1-2 times daily to remove allergens from the nasal cavities as well as help with mucous clearance (this is especially helpful to do before the nasal sprays are given). - Consider Mucinex if needed for congestion.   3. Return in about 3 months (around 05/11/2018).  Please inform us of any Emergency Department visits, hospitalizations, or changes in symptoms. Call us before going to the ED for breathing or allergy symptoms since we might be able to fit you in for a sick visit. Feel free to contact us anytime with any questions, problems, or concerns.  It was a pleasure to see you again  today!  Websites that have reliable patient information: 1. American Academy of Asthma, Allergy, and Immunology: www.aaaai.org 2. Food Allergy Research and Education (FARE): foodallergy.org 3. Mothers of Asthmatics: http://www.asthmacommunitynetwork.org 4. American College of Allergy, Asthma, and Immunology: www.acaai.org

## 2018-02-08 NOTE — Telephone Encounter (Signed)
Error

## 2018-02-08 NOTE — Progress Notes (Signed)
FOLLOW UP  Date of Service/Encounter:  02/08/18   Assessment:   Moderate persistent asthma without complication   Seasonal allergic rhinitis (outdoor molds)  Plan/Recommendations:   1. Moderate persistent asthma, uncomplicated - Lung testing looks very stable compared to your last spirometries.  - There was minimal improvement with the nebulizer treatment, so I do not think that you need prednisone today. - We did give you a nebulizer today so that you can manage some of your symptoms more effectively at home.  - We will get some lab work to see if you would qualify for an injectable asthma medication.  - Neb and teaching provided. - Daily controller medication(s): Symbicort 160/4.30mcg two puffs twice daily with spacer - Prior to physical activity: ProAir 2 puffs 10-15 minutes before physical activity. - Rescue medications: ProAir 4 puffs every 4-6 hours as needed or albuterol nebulizer one vial every 4-6 hours as needed - Asthma control goals:  * Full participation in all desired activities (may need albuterol before activity) * Albuterol use two time or less a week on average (not counting use with activity) * Cough interfering with sleep two time or less a month * Oral steroids no more than once a year * No hospitalizations  2. Chronic rhinitis - Continue taking: Zyrtec (cetirizine) 10mg  tablet once daily and Flonase (fluticasone) two sprays per nostril daily - You can use an extra dose of the antihistamine, if needed, for breakthrough symptoms.  - Consider nasal saline rinses 1-2 times daily to remove allergens from the nasal cavities as well as help with mucous clearance (this is especially helpful to do before the nasal sprays are given). - Consider Mucinex if needed for congestion.   3. Dysphagia - ? GERD versus EoE - We will empirically start omeprazole 20mg  daily to see if this helps. - GERD would explain her frequent problems with shortness of breath. - Endoscopy  might be indicated, but we will see how she is doing in her follow up in one month.   4. Return in about 1 months (around 05/11/2018).  Subjective:   [name] is a 72 y.o. female presenting today for follow up of  Chief Complaint  Patient presents with  . Asthma  . Shortness of Breath    when walking     [name] has a history of the following: Patient Active Problem List   Diagnosis Date Noted  . Moderate persistent asthma with acute exacerbation 12/05/2017  . Perennial allergic rhinitis 09/20/2017  . MDD (major depressive disorder) 07/31/2017  . Insomnia 02/23/2015  . OSA (obstructive sleep apnea) 02/23/2015  . Asthma 02/23/2015  . Chest pain on exertion 04/10/2013  . Menopausal hot flushes 01/22/2013  . Frequency of urination 01/22/2013    History obtained from: chart review and patient.  [name]'s Primary Care Provider is Jackie Plum, MD.     Elecia is a 72 y.o. female presenting for a sick visit.  She was last seen in July 2019 for a sick visit.  At that time, she was endorsing chest tightness, shortness of breath, and cough for 2 weeks as well as chest congestion.  At that visit, our nurse practitioner started her on a prednisone pack.  She recommended continuing the Symbicort 160/4.5 mcg 2 puffs twice daily.  She was continued on Zyrtec and Flonase.  It was also recommended that she follow-up with her cardiologist.  Since the last visit, she has mostly done well. She reports that over  the last three days. She has been using her rescue inhaler 2-3 times per day. She does report compliance with her Symbicort. She has not had any elevated eosinophils per review of her blood work.   Asthma/Respiratory Symptom History: She remains on the Symbicort two puffs BID. Her last prednisone was around two months ago when she saw [name] in clinic. She has been using her albuterol more frequently as of late, upwards for 3-4 times per day for a few days.    Allergic Rhinitis Symptom History: Her last skin testing was performed in 2008 and was positive to mold mix #4 but otherwise negative. She is on her nasal spray.   She does report that she is having some problems with swallowing food in her esophagus. She has never seen GI in the past. She is not on a reflux medication, per review of her medication list.   Of note, she does see a Cardiologist (Dr. Orpah Cobb). 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 She had an echocardiogram one month ago that was normal.   She has been seen by Dr. Haroldine Laws in the past for her ears. She did have surgery performed at some point, butshe does not remember what exactly was performed.  Otherwise, there have been no changes to her past medical history, surgical history, family history, or social history.    Review of Systems: a 14-point review of systems is pertinent for what is mentioned in HPI.  Otherwise, all other systems were negative.  Constitutional: negative other than that listed in the HPI Eyes: negative other than that listed in the HPI Ears, nose, mouth, throat, and face: negative other than that listed in the HPI Respiratory: negative other than that listed in the HPI Cardiovascular: negative other than that listed in the HPI Gastrointestinal: negative other than that listed in the HPI Genitourinary: negative other than that listed in the HPI Integument: negative other than that listed in the HPI Hematologic: negative other than that listed in the HPI Musculoskeletal: negative other than that listed in the HPI Neurological: negative other than that listed in the HPI Allergy/Immunologic: negative other than that listed in the HPI    Objective:   Blood pressure 124/82, pulse 90, resp. rate 20, SpO2 96 %. There is no height or weight on file to calculate BMI.   Physical Exam:  General: Alert, interactive, in no acute distress. Eyes: No conjunctival injection present on the right, No  conjunctival injection present on the left, no discharge on the right, no discharge on the left and no Horner-Trantas dots present. PERRL bilaterally. EOMI without pain. No photophobia.  Ears: Right TM pearly gray with normal light reflex, Left OME, Right TM intact without perforation and Left TM intact without perforation.  Nose/Throat: External nose within normal limits, nasal crease present and septum midline. Turbinates edematous with clear discharge. Posterior oropharynx erythematous without cobblestoning in the posterior oropharynx. Tonsils 2+ without exudates.  Tongue without thrush. Lungs: Clear to auscultation without wheezing, rhonchi or rales. No increased work of breathing. CV: Normal S1/S2. No murmurs. Capillary refill <2 seconds.  Skin: Warm and dry, without lesions or rashes. Neuro:   Grossly intact. No focal deficits appreciated. Responsive to questions.  Diagnostic studies:   Spirometry: results normal (FEV1: 1.37/87%, FVC: 1.60/78%, FEV1/FVC: 86%).    Spirometry consistent with normal pattern. Albuterol nebulizer treatment given in clinic with improvement in FVC, but not significant per ATS criteria.  Allergy Studies: none      Francis Dowse  Ernst Bowler, MD  [facility]

## 2018-02-11 ENCOUNTER — Telehealth: Payer: Self-pay | Admitting: *Deleted

## 2018-02-11 ENCOUNTER — Encounter: Payer: Self-pay | Admitting: Allergy & Immunology

## 2018-02-11 NOTE — Telephone Encounter (Signed)
-----   Message from Alfonse Spruce, MD sent at 02/11/2018  5:36 AM EDT ----- Can someone call Ms. Shabazz to let her know that I want to add on omeprazole to help with possible reflux? Also please confirm the pharmacy where she wants the prescription sent. Thanks!   Malachi Bonds, MD [facility]

## 2018-02-11 NOTE — Telephone Encounter (Signed)
Phone number listed in chart not in service. Mailed letter to patient's home to call office.

## 2018-02-12 ENCOUNTER — Telehealth: Payer: Self-pay | Admitting: *Deleted

## 2018-02-12 LAB — IGE+ALLERGENS ZONE 2(30)
Amer Sycamore IgE Qn: <0.1 kU/L
Bahia Grass IgE: <0.1 kU/L
Bermuda Grass IgE: <0.1 kU/L
Cedar, Mountain IgE: <0.1 kU/L
Cockroach, American IgE: <0.1 kU/L
Common Silver Birch IgE: <0.1 kU/L
D Farinae IgE: <0.1 kU/L
D Pteronyssinus IgE: <0.1 kU/L
Dog Dander IgE: <0.1 kU/L
Elm, American IgE: <0.1 kU/L
Hickory, White IgE: <0.1 kU/L
IgE (Immunoglobulin E), Serum: 18 IU/mL (ref 6–495)
Mucor Racemosus IgE: <0.1 kU/L
Nettle IgE: <0.1 kU/L
Oak, White IgE: <0.1 kU/L
Penicillium Chrysogen IgE: <0.1 kU/L
Plantain, English IgE: <0.1 kU/L
Ragweed, Short IgE: <0.1 kU/L
Stemphylium Herbarum IgE: <0.1 kU/L

## 2018-02-12 MED ORDER — OMEPRAZOLE 20 MG PO CPDR: 20.0000 mg | DELAYED_RELEASE_CAPSULE | Freq: Every day | ORAL | 3 refills | Status: DC

## 2018-02-12 NOTE — Telephone Encounter (Signed)
The number in our system is not available and the other number found on DPR is disconnected. Dr Dellis Anes would you like [place] to send a letter to contact our office? Please advise

## 2018-02-12 NOTE — Telephone Encounter (Signed)
Sure that works.  Thank you so much Rhunette Croft!   Malachi Bonds, MD [facility]

## 2018-02-12 NOTE — Telephone Encounter (Signed)
-----   Message from Alfonse Spruce, MD sent at 02/12/2018  5:51 AM EDT ----- Can someone call to let the patient know that we added on a GERD medication to see if this helps with her breathing? Thanks!

## 2018-02-12 NOTE — Telephone Encounter (Signed)
Letter has been made and mailed to patient

## 2018-02-15 ENCOUNTER — Telehealth: Payer: Self-pay

## 2018-02-15 MED ORDER — ALBUTEROL SULFATE (2.5 MG/3ML) 0.083% IN NEBU: 2.5000 mg | INHALATION_SOLUTION | RESPIRATORY_TRACT | 1 refills | Status: DC | PRN

## 2018-02-15 MED ORDER — ALBUTEROL SULFATE HFA 108 (90 BASE) MCG/ACT IN AERS: 2.0000 | INHALATION_SPRAY | RESPIRATORY_TRACT | 1 refills | Status: DC | PRN

## 2018-02-15 MED ORDER — BUDESONIDE-FORMOTEROL FUMARATE 160-4.5 MCG/ACT IN AERO: 2.0000 | INHALATION_SPRAY | Freq: Two times a day (BID) | RESPIRATORY_TRACT | 5 refills | Status: DC

## 2018-02-15 NOTE — Addendum Note (Signed)
Addended by: Osa Craver on: 02/15/2018 09:30 AM   Modules accepted: Orders

## 2018-02-15 NOTE — Telephone Encounter (Signed)
Patient called and she stated all her medication went to the wrong pharmacy [facility]. Medication need to go to [facility]. Medication called into [facility].

## 2018-02-15 NOTE — Telephone Encounter (Signed)
-----   Message from Alfonse Spruce, MD sent at 02/15/2018  8:43 AM EDT ----- Can someone call Ms. Captain to let her know that her environmental allergy panel was negative? There is a CBC that is pending. Can someone check with Lorayne Marek to see where the CBC results are?   Thanks, Malachi Bonds, MD [facility]

## 2018-02-15 NOTE — Telephone Encounter (Signed)
Attempted to contact patient to discuss.  Phone number is not in service/has been disconnected.  Mailed unable to contact letter.

## 2018-02-21 NOTE — Telephone Encounter (Signed)
Patient returned call and has been advised of the allergy panel results. She is also aware of omeprazole 20 mg. I am having Rochelle with [facility] follow up on the cbc.

## 2018-03-19 ENCOUNTER — Ambulatory Visit: Payer: Medicare Other | Admitting: Allergy & Immunology

## 2019-02-03 ENCOUNTER — Emergency Department (HOSPITAL_COMMUNITY)
Admission: EM | Admit: 2019-02-03 | Discharge: 2019-02-03 | Disposition: A | Discharge status: Home / Self Care | Payer: Medicare Other | Attending: Emergency Medicine | Admitting: Emergency Medicine

## 2019-02-03 ENCOUNTER — Other Ambulatory Visit: Payer: Self-pay

## 2019-02-03 ENCOUNTER — Encounter (HOSPITAL_COMMUNITY): Payer: Self-pay

## 2019-02-03 ENCOUNTER — Emergency Department (HOSPITAL_COMMUNITY): Payer: Medicare Other

## 2019-02-03 DIAGNOSIS — Z87891 Personal history of nicotine dependence: Secondary | ICD-10-CM | POA: Insufficient documentation

## 2019-02-03 DIAGNOSIS — N3000 Acute cystitis without hematuria: Secondary | ICD-10-CM

## 2019-02-03 DIAGNOSIS — Z79899 Other long term (current) drug therapy: Secondary | ICD-10-CM | POA: Insufficient documentation

## 2019-02-03 DIAGNOSIS — I10 Essential (primary) hypertension: Secondary | ICD-10-CM | POA: Diagnosis not present

## 2019-02-03 DIAGNOSIS — I252 Old myocardial infarction: Secondary | ICD-10-CM | POA: Diagnosis not present

## 2019-02-03 DIAGNOSIS — R1030 Lower abdominal pain, unspecified: Secondary | ICD-10-CM

## 2019-02-03 DIAGNOSIS — J45909 Unspecified asthma, uncomplicated: Secondary | ICD-10-CM | POA: Insufficient documentation

## 2019-02-03 DIAGNOSIS — R1032 Left lower quadrant pain: Secondary | ICD-10-CM | POA: Diagnosis present

## 2019-02-03 LAB — URINALYSIS, ROUTINE W REFLEX MICROSCOPIC
Bacteria, UA: NONE SEEN
Bilirubin Urine: NEGATIVE
Glucose, UA: NEGATIVE mg/dL
Ketones, ur: 5 mg/dL — AB
Nitrite: NEGATIVE
Protein, ur: NEGATIVE mg/dL
Specific Gravity, Urine: 1.044 — ABNORMAL HIGH (ref 1.005–1.030)
pH: 5 (ref 5.0–8.0)

## 2019-02-03 LAB — COMPREHENSIVE METABOLIC PANEL
ALT: 19 U/L (ref 0–44)
AST: 16 U/L (ref 15–41)
Albumin: 3.7 g/dL (ref 3.5–5.0)
Alkaline Phosphatase: 81 U/L (ref 38–126)
Anion gap: 10 (ref 5–15)
BUN: 14 mg/dL (ref 8–23)
CO2: 23 mmol/L (ref 22–32)
Calcium: 9.2 mg/dL (ref 8.9–10.3)
Chloride: 107 mmol/L (ref 98–111)
Creatinine, Ser: 1.18 mg/dL — ABNORMAL HIGH (ref 0.44–1.00)
GFR calc Af Amer: 54 mL/min — ABNORMAL LOW (ref >60)
GFR calc non Af Amer: 46 mL/min — ABNORMAL LOW (ref >60)
Glucose, Bld: 103 mg/dL — ABNORMAL HIGH (ref 70–99)
Potassium: 3.7 mmol/L (ref 3.5–5.1)
Sodium: 140 mmol/L (ref 135–145)
Total Bilirubin: 0.7 mg/dL (ref 0.3–1.2)
Total Protein: 7.3 g/dL (ref 6.5–8.1)

## 2019-02-03 LAB — CBC WITH DIFFERENTIAL/PLATELET
Abs Immature Granulocytes: 0.01 10*3/uL (ref 0.00–0.07)
Basophils Absolute: 0 10*3/uL (ref 0.0–0.1)
Basophils Relative: 0 %
Eosinophils Absolute: 0 10*3/uL (ref 0.0–0.5)
Eosinophils Relative: 1 %
HCT: 38.4 % (ref 36.0–46.0)
Hemoglobin: 14.2 g/dL (ref 12.0–15.0)
Immature Granulocytes: 0 %
Lymphocytes Relative: 31 %
Lymphs Abs: 1.7 10*3/uL (ref 0.7–4.0)
MCH: 33.1 pg (ref 26.0–34.0)
MCHC: 37 g/dL — ABNORMAL HIGH (ref 30.0–36.0)
MCV: 89.5 fL (ref 80.0–100.0)
Monocytes Absolute: 0.3 10*3/uL (ref 0.1–1.0)
Monocytes Relative: 6 %
Neutro Abs: 3.4 10*3/uL (ref 1.7–7.7)
Neutrophils Relative %: 62 %
Platelets: 289 10*3/uL (ref 150–400)
RBC: 4.29 MIL/uL (ref 3.87–5.11)
RDW: 13 % (ref 11.5–15.5)
WBC: 5.5 10*3/uL (ref 4.0–10.5)
nRBC: 0 % (ref 0.0–0.2)

## 2019-02-03 LAB — LIPASE, BLOOD: Lipase: 31 U/L (ref 11–51)

## 2019-02-03 MED ORDER — IOHEXOL 300 MG/ML  SOLN
100.0000 mL | Freq: Once | INTRAMUSCULAR | Status: AC | PRN
  Administered 2019-02-03: 14:00:00 100 mL via INTRAVENOUS

## 2019-02-03 MED ORDER — MORPHINE SULFATE (PF) 4 MG/ML IV SOLN
4.0000 mg | Freq: Once | INTRAVENOUS | Status: AC
  Administered 2019-02-03: 11:00:00 4 mg via INTRAVENOUS
  Filled 2019-02-03: qty 1

## 2019-02-03 MED ORDER — CEPHALEXIN 500 MG PO CAPS: 500.0000 mg | ORAL_CAPSULE | Freq: Four times a day (QID) | ORAL | 0 refills | Status: DC

## 2019-02-03 MED ORDER — DICYCLOMINE HCL 20 MG PO TABS: 20.0000 mg | ORAL_TABLET | Freq: Two times a day (BID) | ORAL | 0 refills | Status: DC | PRN

## 2019-02-03 MED ORDER — ONDANSETRON 4 MG PO TBDP: 4.0000 mg | ORAL_TABLET | Freq: Three times a day (TID) | ORAL | 0 refills | Status: DC | PRN

## 2019-02-03 MED ORDER — CEPHALEXIN 250 MG PO CAPS
500.0000 mg | ORAL_CAPSULE | Freq: Once | ORAL | Status: DC
  Filled 2019-02-03: qty 2

## 2019-02-03 MED ORDER — ONDANSETRON HCL 4 MG/2ML IJ SOLN
4.0000 mg | Freq: Once | INTRAMUSCULAR | Status: AC
  Administered 2019-02-03: 11:00:00 4 mg via INTRAVENOUS
  Filled 2019-02-03: qty 2

## 2019-02-03 MED ORDER — HYDROCODONE-ACETAMINOPHEN 5-325 MG PO TABS: 1.0000 | ORAL_TABLET | ORAL | 0 refills | Status: DC | PRN

## 2019-02-03 NOTE — ED Triage Notes (Signed)
Patient complains of 6 months of abdominal swelling with increased pressure to abdomen. States she has been seeing her doctor with no diagnosis. Patient alert and oriented, NAD

## 2019-02-03 NOTE — ED Provider Notes (Addendum)
[facility] EMERGENCY DEPARTMENT Provider Note   CSN: [id number] Arrival date & time: 02/03/19  1011     History   Chief Complaint No chief complaint on file.   HPI [name] is a 73 y.o. female.     Pt presents to the ED today with abdominal pain and swelling.  Pt said her abdomen has been hurting for about 1 week.  She said she eats and about 10 minutes later, she feels nauseous and throws up.  She said the pain is mostly in the LLQ.     Past Medical History:  Diagnosis Date   Allergy    Asthma    Chronic headaches    Endometriosis    Heart attack ([place])    Hypertension    Mood swings     Patient Active Problem List   Diagnosis Date Noted   Moderate persistent asthma with acute exacerbation 12/05/2017   Perennial allergic rhinitis 09/20/2017   MDD (major depressive disorder) 07/31/2017   Insomnia 02/23/2015   OSA (obstructive sleep apnea) 02/23/2015   Asthma 02/23/2015   Chest pain on exertion 04/10/2013   Menopausal hot flushes 01/22/2013   Frequency of urination 01/22/2013    Past Surgical History:  Procedure Laterality Date   ABDOMINAL HYSTERECTOMY     EXPLORATORY LAPAROTOMY     I&D EXTREMITY Left 08/04/2017   Procedure: IRRIGATION AND DEBRIDEMENT HAND;  Surgeon: Leanora Cover, MD;  Location: [facility];  Service: Orthopedics;  Laterality: Left;   LEFT HEART CATHETERIZATION WITH CORONARY ANGIOGRAM N/A 04/10/2013   Procedure: LEFT HEART CATHETERIZATION WITH CORONARY ANGIOGRAM;  Surgeon: Birdie Riddle, MD;  Location: [facility] CATH LAB;  Service: Cardiovascular;  Laterality: N/A;     OB History   No obstetric history on file.      Home Medications    Prior to Admission medications   Medication Sig Start Date End Date Taking? Authorizing Provider  albuterol (PROAIR HFA) 108 (90 Base) MCG/ACT inhaler Inhale 2 puffs into the lungs every 4 (four) hours as needed for wheezing or shortness of breath. 02/15/18   Valentina Shaggy, MD  albuterol (PROVENTIL) (2.5 MG/3ML) 0.083% nebulizer solution Take 3 mLs (2.5 mg total) by nebulization every 4 (four) hours as needed for wheezing or shortness of breath. 02/15/18   Valentina Shaggy, MD  amLODipine (NORVASC) 2.5 MG tablet Take 2.5 mg by mouth daily. 12/10/17   [provider]  aspirin EC 325 MG tablet Take 325 mg by mouth daily as needed for mild pain.    [provider]  budesonide-formoterol (SYMBICORT) 160-4.5 MCG/ACT inhaler Inhale 2 puffs into the lungs 2 (two) times daily. 02/15/18   Valentina Shaggy, MD  calcium-vitamin D (OSCAL 500/200 D-3) 500-200 MG-UNIT tablet Take by mouth.    [provider]  cephALEXin (KEFLEX) 500 MG capsule Take 1 capsule (500 mg total) by mouth 4 (four) times daily. 02/03/19   Isla Pence, MD  dicyclomine (BENTYL) 20 MG tablet Take 1 tablet (20 mg total) by mouth 2 (two) times daily as needed (abdominal pain). 02/03/19   Isla Pence, MD  fluticasone (FLONASE) 50 MCG/ACT nasal spray Place 2 sprays into both nostrils daily. 12/05/17   Dara Hoyer, FNP  Fluticasone-Salmeterol (ADVAIR) 100-50 MCG/DOSE AEPB Inhale into the lungs.    [provider]  hydrALAZINE (APRESOLINE) 25 MG tablet Take 25 mg by mouth 2 (two) times daily. 01/03/18   [provider]  HYDROcodone-acetaminophen (NORCO/VICODIN) 5-325 MG tablet Take  1 tablet by mouth every 4 (four) hours as needed. 02/03/19   Jacalyn Lefevre, MD  metoprolol succinate (TOPROL-XL) 25 MG 24 hr tablet TAKE 1 TABLET BY MOUTH ONCE DAILY FOR 30 DAYS 09/28/17   [provider]  nitroGLYCERIN (NITROSTAT) 0.4 MG SL tablet Place 0.4 mg under the tongue every 5 (five) minutes as needed for chest pain.    [provider]  omeprazole (PRILOSEC) 20 MG capsule Take 1 capsule (20 mg total) by mouth daily. 02/12/18 03/14/18  Alfonse Spruce, MD  ondansetron (ZOFRAN ODT) 4 MG disintegrating tablet Take 1 tablet (4 mg total) by mouth  every 8 (eight) hours as needed. 02/03/19   Jacalyn Lefevre, MD    Family History Family History  Problem Relation Age of Onset   Cancer Mother    Heart disease Mother    Cancer Father    Heart disease Father     Social History Social History   Tobacco Use   Smoking status: Former Smoker   Smokeless tobacco: Never Used  Substance Use Topics   Alcohol use: Yes    Comment: occasional   Drug use: No     Allergies   Patient has no known allergies.   Review of Systems Review of Systems  Gastrointestinal: Positive for abdominal pain, nausea and vomiting.  All other systems reviewed and are negative.    Physical Exam Updated Vital Signs BP (!) 128/92    Pulse 83    Temp 98.3 F (36.8 C)    Resp 20    SpO2 96%   Physical Exam Vitals signs and nursing note reviewed.  Constitutional:      Appearance: Normal appearance.  HENT:     Head: Normocephalic and atraumatic.     Right Ear: External ear normal.     Left Ear: External ear normal.     Nose: Nose normal.     Mouth/Throat:     Mouth: Mucous membranes are dry.  Eyes:     Extraocular Movements: Extraocular movements intact.     Conjunctiva/sclera: Conjunctivae normal.     Pupils: Pupils are equal, round, and reactive to light.  Neck:     Musculoskeletal: Normal range of motion and neck supple.  Cardiovascular:     Rate and Rhythm: Normal rate and regular rhythm.     Pulses: Normal pulses.     Heart sounds: Normal heart sounds.  Pulmonary:     Effort: Pulmonary effort is normal.     Breath sounds: Normal breath sounds.  Abdominal:     General: Abdomen is flat. Bowel sounds are normal.     Tenderness: There is generalized abdominal tenderness.  Musculoskeletal: Normal range of motion.  Skin:    General: Skin is warm.     Capillary Refill: Capillary refill takes less than 2 seconds.  Neurological:     General: No focal deficit present.     Mental Status: She is alert and oriented to person, place,  and time.  Psychiatric:        Mood and Affect: Mood normal.        Behavior: Behavior normal.        Thought Content: Thought content normal.        Judgment: Judgment normal.      ED Treatments / Results  Labs (all labs ordered are listed, but only abnormal results are displayed) Labs Reviewed  CBC WITH DIFFERENTIAL/PLATELET - Abnormal; Notable for the following components:      Result Value  MCHC 37.0 (*)    All other components within normal limits  COMPREHENSIVE METABOLIC PANEL - Abnormal; Notable for the following components:   Glucose, Bld 103 (*)    Creatinine, Ser 1.18 (*)    GFR calc non Af Amer 46 (*)    GFR calc Af Amer 54 (*)    All other components within normal limits  URINALYSIS, ROUTINE W REFLEX MICROSCOPIC - Abnormal; Notable for the following components:   Specific Gravity, Urine 1.044 (*)    Hgb urine dipstick SMALL (*)    Ketones, ur 5 (*)    Leukocytes,Ua SMALL (*)    All other components within normal limits  LIPASE, BLOOD    EKG None  Radiology Ct Abdomen Pelvis W Contrast  Result Date: 02/03/2019 CLINICAL DATA:  Six month history of abdominal swelling and increased abdominal pressure with postprandial vomiting. EXAM: CT ABDOMEN AND PELVIS WITH CONTRAST TECHNIQUE: Multidetector CT imaging of the abdomen and pelvis was performed using the standard protocol following bolus administration of intravenous contrast. CONTRAST:  OMNIPAQUE IOHEXOL 300 MG/ML  SOLN COMPARISON:  08/09/2006 FINDINGS: Lower chest: Lung bases are clear. Hepatobiliary: Tiny calcified granuloma over the left lobe of the liver. Liver is otherwise unremarkable. Gallbladder and biliary tree are normal. Pancreas: Normal. Spleen: Normal. Adrenals/Urinary Tract: Adrenal glands are normal. Kidneys are normal in size without hydronephrosis or nephrolithiasis. Ureters and bladder are normal. Stomach/Bowel: Stomach and small bowel are normal. Appendix is normal. Mild diverticulosis of the  colon. Vascular/Lymphatic: Abdominal aorta is normal in caliber. No evidence of adenopathy. Reproductive: Previous hysterectomy.  Ovaries not visualized. Other: No free fluid or focal inflammatory change. Musculoskeletal: Minimal degenerative change of the spine. Stable L4 hemangioma. IMPRESSION: No acute findings in the abdomen/pelvis. Mild colonic diverticulosis. Electronically Signed   By: Elberta Fortis M.D.   On: 02/03/2019 14:09    Procedures Procedures (including critical care time)  Medications Ordered in ED Medications  cephALEXin (KEFLEX) capsule 500 mg (has no administration in time range)  morphine 4 MG/ML injection 4 mg (4 mg Intravenous Given 02/03/19 1115)  ondansetron (ZOFRAN) injection 4 mg (4 mg Intravenous Given 02/03/19 1115)  iohexol (OMNIPAQUE) 300 MG/ML solution 100 mL (100 mLs Intravenous Contrast Given 02/03/19 1341)     Initial Impression / Assessment and Plan / ED Course  I have reviewed the triage vital signs and the nursing notes.  Pertinent labs & imaging results that were available during my care of the patient were reviewed by me and considered in my medical decision making (see chart for details).    Pt is feeling much better.  She does have a UTI.  CT abd/pelvis ok.  Pt to f/u with GI.  Return if worse.  Final Clinical Impressions(s) / ED Diagnoses   Final diagnoses:  Lower abdominal pain  Acute cystitis without hematuria    ED Discharge Orders         Ordered    cephALEXin (KEFLEX) 500 MG capsule  4 times daily     02/03/19 1559    HYDROcodone-acetaminophen (NORCO/VICODIN) 5-325 MG tablet  Every 4 hours PRN     02/03/19 1559    ondansetron (ZOFRAN ODT) 4 MG disintegrating tablet  Every 8 hours PRN     02/03/19 1559    dicyclomine (BENTYL) 20 MG tablet  2 times daily PRN     02/03/19 1559           Jacalyn Lefevre, MD 02/03/19 1607    Jacalyn Lefevre, MD  02/03/19 1611 ° °

## 2019-02-03 NOTE — ED Notes (Signed)
Transported to CT 

## 2019-02-03 NOTE — ED Notes (Signed)
States she is feeling much better 

## 2019-02-03 NOTE — ED Notes (Signed)
Patient Alert and oriented to baseline. Stable and ambulatory to baseline. Patient verbalized understanding of the discharge instructions.  Patient belongings were taken by the patient.   

## 2019-02-06 ENCOUNTER — Other Ambulatory Visit: Payer: Self-pay | Admitting: Family Medicine

## 2019-02-06 ENCOUNTER — Other Ambulatory Visit: Payer: Self-pay | Admitting: Allergy & Immunology

## 2019-03-12 ENCOUNTER — Emergency Department (HOSPITAL_COMMUNITY)
Admission: EM | Admit: 2019-03-12 | Discharge: 2019-03-12 | Disposition: A | Discharge status: Home / Self Care | Payer: Medicare Other | Attending: Emergency Medicine | Admitting: Emergency Medicine

## 2019-03-12 ENCOUNTER — Other Ambulatory Visit: Payer: Self-pay

## 2019-03-12 ENCOUNTER — Encounter (HOSPITAL_COMMUNITY): Payer: Self-pay | Admitting: Obstetrics and Gynecology

## 2019-03-12 DIAGNOSIS — M79604 Pain in right leg: Secondary | ICD-10-CM | POA: Insufficient documentation

## 2019-03-12 DIAGNOSIS — R350 Frequency of micturition: Secondary | ICD-10-CM | POA: Insufficient documentation

## 2019-03-12 DIAGNOSIS — Z5321 Procedure and treatment not carried out due to patient leaving prior to being seen by health care provider: Secondary | ICD-10-CM | POA: Insufficient documentation

## 2019-03-12 DIAGNOSIS — M79605 Pain in left leg: Secondary | ICD-10-CM | POA: Diagnosis not present

## 2019-03-12 NOTE — ED Triage Notes (Signed)
Patient reports she is having increased urination and not making it to the bathroom due to the frequencey. Patient reports she has pain in her legs and it hurts to walk. Patient was told she has vascular issues in her leg

## 2019-03-14 ENCOUNTER — Other Ambulatory Visit: Payer: Self-pay | Admitting: Family Medicine

## 2019-03-14 ENCOUNTER — Other Ambulatory Visit: Payer: Self-pay | Admitting: Allergy & Immunology

## 2019-03-31 ENCOUNTER — Ambulatory Visit (HOSPITAL_COMMUNITY)
Admission: RE | Admit: 2019-03-31 | Discharge: 2019-03-31 | Disposition: A | Discharge status: Home / Self Care | Payer: Medicare Other | Attending: Psychiatry | Admitting: Psychiatry

## 2019-03-31 DIAGNOSIS — R45851 Suicidal ideations: Secondary | ICD-10-CM | POA: Insufficient documentation

## 2019-03-31 NOTE — H&P (Signed)
Behavioral Health Medical Screening Exam  [name] is an 73 y.o. female.who presents to [facility], voluntarily. She endorses suicidal thoughts without plan or intent although notes that she has flashes of walking into traffic. Reports that's she was evicted from her apartment 2 weeks ago after breaking her lease for having cats. Reports she was living in a hotel however, for the past 3 days, she has been living with her sister. She reports there is no problem with living with her sister whom has been very supportive although he would like resources to go to a shelter until, she get her check on the first of next month. She denies homicidal ideations. When asked about AVH she states she hears voices described as,: harassing me. Ordering me to do things like leave the state." She denies other psychosis and does not appear internally preoccupied. Reports she has eben hospitalized here at [facility] int he very distant past. She denies history of SA or self-harming behavior. She reports she is currently on Rexulti prescribed by [facility]. Reports she does not feel that the medication is effective and prefers to go back on the medication she was on years ago. She reports drinking beer on occasion although denies other substance abuse or use. She denies legal issues. She endorses feelings of depression with symptoms noted as wanting to be alone. She denies other symptoms of depression at this time.  She is requesting intensive outpatient resources for mood disorder and housing.   Total Time spent with patient: 15 minutes  Psychiatric Specialty Exam: Physical Exam  Vitals reviewed. Constitutional: She is oriented to person, place, and time.  Neurological: She is alert and oriented to person, place, and time.    Review of Systems  Psychiatric/Behavioral: Positive for depression and suicidal ideas.  All other systems reviewed and are negative.   There were no vitals taken for this visit.There is no height or  weight on file to calculate BMI.  General Appearance: Fairly Groomed  Eye Contact:  Good  Speech:  Clear and Coherent and Normal Rate  Volume:  Normal  Mood:  Depressed  Affect:  Congruent  Thought Process:  Coherent, Linear and Descriptions of Associations: Intact  Orientation:  Full (Time, Place, and Person)  Thought Content:  Hallucinations: Auditory  Suicidal Thoughts:  Yes.  without intent/plan  Homicidal Thoughts:  No  Memory:  Immediate;   Fair Recent;   Fair  Judgement:  Fair  Insight:  Fair  Psychomotor Activity:  Normal  Concentration: Concentration: Fair and Attention Span: Fair  Recall:  [employer] of Knowledge:Fair  Language: Good  Akathisia:  Negative  Handed:  Right  AIMS (if indicated):     Assets:  Communication Skills Desire for Improvement Resilience Social Support  Sleep:       Musculoskeletal: Strength & Muscle Tone: within normal limits Gait & Station: normal Patient leans: N/A  There were no vitals taken for this visit.  Recommendations:  Based on my evaluation the patient does not appear to have an emergency medical condition.   Patient does not present as a danger to herself or others. She is requesting intensive outpatient resources for mood disorder and housing. We have provided community resources as requested. She was also encouraged to follow-up with resources to discuss her concerns with current medications. Patient advised that if symptoms worsen or do not continue to improve or if the patient becomes actively suicidal or homicidal then it is recommended that the patient return to the  closest hospital emergency room or call 911 for further evaluation and treatment.  National Suicide Prevention Lifeline 1800-SUICIDE or [phone].     Denzil Magnuson, NP 03/31/2019, 2:43 PM
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
## 2019-04-09 ENCOUNTER — Other Ambulatory Visit: Payer: Self-pay | Admitting: Allergy & Immunology

## 2019-04-16 ENCOUNTER — Other Ambulatory Visit (HOSPITAL_COMMUNITY): Payer: Self-pay | Admitting: Psychiatry

## 2019-04-17 ENCOUNTER — Other Ambulatory Visit: Payer: Self-pay | Admitting: Internal Medicine

## 2019-04-17 DIAGNOSIS — Z1231 Encounter for screening mammogram for malignant neoplasm of breast: Secondary | ICD-10-CM

## 2019-04-18 ENCOUNTER — Other Ambulatory Visit: Payer: Self-pay | Admitting: Internal Medicine

## 2019-04-18 DIAGNOSIS — R5381 Other malaise: Secondary | ICD-10-CM

## 2019-05-05 ENCOUNTER — Telehealth: Payer: Self-pay | Admitting: Allergy & Immunology

## 2019-05-05 MED ORDER — ALBUTEROL SULFATE HFA 108 (90 BASE) MCG/ACT IN AERS: 2.0000 | INHALATION_SPRAY | RESPIRATORY_TRACT | 0 refills | Status: DC | PRN

## 2019-05-05 NOTE — Telephone Encounter (Signed)
1 inhaler has been sent in with note to pharmacy for patient to keep scheduled appointment.

## 2019-05-05 NOTE — Telephone Encounter (Signed)
Patient called needing a refill on albuterol. Patient scheduled an appointment 05/06/2019 for a yearly. Patient would like to know if a courtesy refill could be called in.   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   Please advise.

## 2019-05-06 ENCOUNTER — Ambulatory Visit (INDEPENDENT_AMBULATORY_CARE_PROVIDER_SITE_OTHER): Payer: Medicare Other | Admitting: Allergy & Immunology

## 2019-05-06 ENCOUNTER — Encounter: Payer: Self-pay | Admitting: Allergy & Immunology

## 2019-05-06 ENCOUNTER — Other Ambulatory Visit: Payer: Self-pay

## 2019-05-06 VITALS — BP 136/90 | HR 90 | Temp 97.5°F | Resp 18 | Ht 60.0 in | Wt 192.4 lb

## 2019-05-06 DIAGNOSIS — J4540 Moderate persistent asthma, uncomplicated: Secondary | ICD-10-CM | POA: Diagnosis not present

## 2019-05-06 DIAGNOSIS — J4541 Moderate persistent asthma with (acute) exacerbation: Secondary | ICD-10-CM

## 2019-05-06 DIAGNOSIS — J3089 Other allergic rhinitis: Secondary | ICD-10-CM

## 2019-05-06 NOTE — Progress Notes (Signed)
FOLLOW UP  Date of Service/Encounter:  05/06/19   Assessment:   Moderate persistent asthma without complication   Seasonal allergic rhinitis (outdoor molds)  History of non-compliance   Plan/Recommendations:   1. Moderate persistent asthma with acute exacerbation - Lung testing looks very stable compared to your last spirometries.  - We did give you a few puffs of albuterol and your air movement did improve. - Start the prednisone pack provided. - Restart the Symbicort two puffs TWICE DAILY every day for the best effect. - It is important to keep follow up appointments so we can continue to make sure that you are doing your best from a breathing perspective.  - Daily controller medication(s): Symbicort 160/4.36mcg two puffs twice daily with spacer - Prior to physical activity: ProAir 2 puffs 10-15 minutes before physical activity. - Rescue medications: ProAir 4 puffs every 4-6 hours as needed or albuterol nebulizer one vial every 4-6 hours as needed - Asthma control goals:  * Full participation in all desired activities (may need albuterol before activity) * Albuterol use two time or less a week on average (not counting use with activity) * Cough interfering with sleep two time or less a month * Oral steroids no more than once a year * No hospitalizations  2. Chronic rhinitis - Continue taking: Zyrtec (cetirizine) 10mg  tablet once daily and Flonase (fluticasone) two sprays per nostril daily - You can use an extra dose of the antihistamine, if needed, for breakthrough symptoms.  - Consider nasal saline rinses 1-2 times daily to remove allergens from the nasal cavities as well as help with mucous clearance (this is especially helpful to do before the nasal sprays are given). - Consider Mucinex if needed for congestion.   3. Return in about 3 months (around 08/04/2019). This can be an in-person, a virtual Webex or a telephone follow up visit.   Subjective:   [name] is a 73 y.o. female presenting today for follow up of  Chief Complaint  Patient presents with  . Asthma    has been wheezing x 1 week, using albuterol with some relief, but not that much    [name] has a history of the following: Patient Active Problem List   Diagnosis Date Noted  . Moderate persistent asthma with acute exacerbation 12/05/2017  . Perennial allergic rhinitis 09/20/2017  . MDD (major depressive disorder) 07/31/2017  . Insomnia 02/23/2015  . OSA (obstructive sleep apnea) 02/23/2015  . Asthma 02/23/2015  . Chest pain on exertion 04/10/2013  . Menopausal hot flushes 01/22/2013  . Frequency of urination 01/22/2013    History obtained from: chart review and patient.  [name] is a 73 y.o. female presenting for a follow up visit.  She was last seen in October 2019.  At that time, her lung testing looks stable and there was no improvement with the nebulizer treatment.  We did not treat her with prednisone.  We did obtain some lab work to see if she will qualify for one of our injectable medications.  We continued with Symbicort 160 mcg 2 puffs twice daily with albuterol as needed.  For her rhinitis, we continued Zyrtec and Flonase.  She was having some dysphagia, so we started omeprazole 20 mg daily to see if this would help.  We did obtain an environmental allergy panel that was negative to the entire panel.  A complete blood count in the last 3 months showed no eosinophils at all.  Of note, she has  a longstanding history of poor compliance with follow-ups and medication.  Since last visit, she reports that she has done well. However, she is endorsing two weeks of wheezing and coughing which has not been amenable to her albuterol. She reports that she is not out of her albuterol, but nearing the end of the inhaler. She does report remaining on Symbicort and endorses compliance with two puffs twice daily. However, when I ask who has been refilling the medication  since it has been over a year since I saw her, she hesitates and then says that her PCP has been refilling the Symbicort. I did spend some time discussing the importance of keeping follow up appointments so that we can stay on top of her asthma management.   Asthma Symptom History: She has not needed prednisone for her breathing since the last visit. ACT score is 12 today, indicating poor asthma control. She has been having the problems over the last two weeks, as described above. She has not been in the hospital or ED for her breathing. She does tell me that she has had some problems with degenerative disc disease and back pain.   Allergic Rhinitis Symptom History: An environmental IgE panel last year was completely negative.  She had testing done by Dr. Ishmael Holter in the distant past (2008) that was positive to outdoor molds (mold mix 4). She is not using anything on a regular basis. She does not think that she has needed antibiotics at all since the last visit.   Otherwise, there have been no changes to her past medical history, surgical history, family history, or social history.    Review of Systems  Constitutional: Negative.  Negative for chills, fever, malaise/fatigue and weight loss.  HENT: Negative.  Negative for congestion, ear discharge and ear pain.   Eyes: Negative for pain, discharge and redness.  Respiratory: Positive for cough and shortness of breath. Negative for sputum production and wheezing.   Cardiovascular: Negative.  Negative for chest pain and palpitations.  Gastrointestinal: Negative for abdominal pain, constipation, diarrhea, heartburn, nausea and vomiting.  Skin: Negative.  Negative for itching and rash.  Neurological: Negative for dizziness and headaches.  Endo/Heme/Allergies: Negative for environmental allergies. Does not bruise/bleed easily.       Objective:   Blood pressure 136/90, pulse 90, temperature (!) 97.5 F (36.4 C), temperature source Temporal, resp. rate  18, height 5' (1.524 m), weight 192 lb 6.4 oz (87.3 kg), SpO2 98 %. Body mass index is 37.58 kg/m.   Physical Exam:  Physical Exam  Constitutional: She appears well-developed.  Making full sentences.   HENT:  Head: Normocephalic and atraumatic.  Right Ear: Tympanic membrane, external ear and ear canal normal.  Left Ear: Tympanic membrane, external ear and ear canal normal.  Nose: Mucosal edema and rhinorrhea present. No nasal deformity or septal deviation. No epistaxis. Right sinus exhibits no maxillary sinus tenderness and no frontal sinus tenderness. Left sinus exhibits no maxillary sinus tenderness and no frontal sinus tenderness.  Mouth/Throat: Uvula is midline and oropharynx is clear and moist. Mucous membranes are not pale and not dry.  Eyes: Pupils are equal, round, and reactive to light. Conjunctivae and EOM are normal. Right eye exhibits no chemosis and no discharge. Left eye exhibits no chemosis and no discharge. Right conjunctiva is not injected. Left conjunctiva is not injected.  Cardiovascular: Normal rate, regular rhythm and normal heart sounds.  Respiratory: Effort normal and breath sounds normal. No accessory muscle usage. No tachypnea. No respiratory  distress. She has no wheezes. She has no rhonchi. She has no rales. She exhibits no tenderness.  Decreased air movement at the basis. This does improve with albuterol treatment.   Lymphadenopathy:    She has no cervical adenopathy.  Neurological: She is alert.  Skin: No abrasion, no petechiae and no rash noted. Rash is not papular, not vesicular and not urticarial. No erythema. No pallor.  No eczematous or urticarial lesions noted.   Psychiatric: She has a normal mood and affect.     Diagnostic studies:    Spirometry: results normal (FEV1: 1.48/104%, FVC: 1.64/89%, FEV1/FVC: 90%).    Spirometry consistent with normal pattern. She was given four puffs of albuterol with improvement in air movement.   Allergy Studies:  none       Malachi Bonds, MD  [facility]

## 2019-05-06 NOTE — Patient Instructions (Addendum)
1. Moderate persistent asthma with acute exacerbation - Lung testing looks very stable compared to your last spirometries.  - We did give you a few puffs of albuterol and your air movement did improve. - Start the prednisone pack provided. - Restart the Symbicort two puffs TWICE DAILY every day for the best effect. - It is important to keep follow up appointments so we can continue to make sure that you are doing your best from a breathing perspective.  - Daily controller medication(s): Symbicort 160/4.41mcg two puffs twice daily with spacer - Prior to physical activity: ProAir 2 puffs 10-15 minutes before physical activity. - Rescue medications: ProAir 4 puffs every 4-6 hours as needed or albuterol nebulizer one vial every 4-6 hours as needed - Asthma control goals:  * Full participation in all desired activities (may need albuterol before activity) * Albuterol use two time or less a week on average (not counting use with activity) * Cough interfering with sleep two time or less a month * Oral steroids no more than once a year * No hospitalizations  2. Chronic rhinitis - Continue taking: Zyrtec (cetirizine) 10mg  tablet once daily and Flonase (fluticasone) two sprays per nostril daily - You can use an extra dose of the antihistamine, if needed, for breakthrough symptoms.  - Consider nasal saline rinses 1-2 times daily to remove allergens from the nasal cavities as well as help with mucous clearance (this is especially helpful to do before the nasal sprays are given). - Consider Mucinex if needed for congestion.   3. Return in about 3 months (around 08/04/2019). This can be an in-person, a virtual Webex or a telephone follow up visit.   Please inform us of any Emergency Department visits, hospitalizations, or changes in symptoms. Call us before going to the ED for breathing or allergy symptoms since we might be able to fit you in for a sick visit. Feel free to contact us anytime with any  questions, problems, or concerns.  It was a pleasure to see you again today!  Websites that have reliable patient information: 1. American Academy of Asthma, Allergy, and Immunology: www.aaaai.org 2. Food Allergy Research and Education (FARE): foodallergy.org 3. Mothers of Asthmatics: http://www.asthmacommunitynetwork.org 4. American College of Allergy, Asthma, and Immunology: www.acaai.org  "Like" [place] on Facebook and Instagram for our latest updates!        Make sure you are registered to vote! If you have moved or changed any of your contact information, you will need to get this updated before voting!  In some cases, you MAY be able to register to vote online: [email or web address]

## 2019-05-12 ENCOUNTER — Telehealth: Payer: Self-pay | Admitting: Allergy & Immunology

## 2019-05-12 MED ORDER — BUDESONIDE-FORMOTEROL FUMARATE 160-4.5 MCG/ACT IN AERO: 2.0000 | INHALATION_SPRAY | Freq: Two times a day (BID) | RESPIRATORY_TRACT | 5 refills | Status: DC

## 2019-05-12 NOTE — Telephone Encounter (Signed)
Called number listed and received message that the number had been disconnected. This happened twice. I am sending the prescription for Symbicort in to the requested pharmacy.

## 2019-05-12 NOTE — Telephone Encounter (Signed)
Patient called stating that she is still feeling bad. Patient is wheezing very bad, sleeping with her eyes open, and having a hard time breathing. Patient also states that Symbicort was not called in from her 05/06/2019 visit.  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  Please advise

## 2019-07-22 ENCOUNTER — Other Ambulatory Visit: Payer: Self-pay | Admitting: Internal Medicine

## 2019-07-22 DIAGNOSIS — N644 Mastodynia: Secondary | ICD-10-CM

## 2019-08-02 ENCOUNTER — Other Ambulatory Visit: Payer: Self-pay | Admitting: Allergy & Immunology

## 2019-08-05 ENCOUNTER — Ambulatory Visit (INDEPENDENT_AMBULATORY_CARE_PROVIDER_SITE_OTHER): Payer: Medicare Other | Admitting: Allergy & Immunology

## 2019-08-05 ENCOUNTER — Encounter: Payer: Self-pay | Admitting: Allergy & Immunology

## 2019-08-05 ENCOUNTER — Other Ambulatory Visit: Payer: Self-pay

## 2019-08-05 ENCOUNTER — Ambulatory Visit
Admission: RE | Admit: 2019-08-05 | Discharge: 2019-08-05 | Disposition: A | Discharge status: Home / Self Care | Payer: Medicare Other | Source: Ambulatory Visit | Attending: Allergy & Immunology | Admitting: Allergy & Immunology

## 2019-08-05 VITALS — BP 112/82 | HR 98 | Temp 97.8°F | Resp 16

## 2019-08-05 DIAGNOSIS — J4540 Moderate persistent asthma, uncomplicated: Secondary | ICD-10-CM

## 2019-08-05 DIAGNOSIS — J3089 Other allergic rhinitis: Secondary | ICD-10-CM

## 2019-08-05 NOTE — Progress Notes (Signed)
FOLLOW UP  Date of Service/Encounter:  08/05/19   Assessment:   Moderate persistent asthma without complication  Perennial allergic rhinitis  Plan/Recommendations:   1. Moderate persistent asthma, uncomplicated - We are going to get a chest X ray to look for any rib fractures.  - Lung testing look VERY good.  - We will call you with results of the chest X-ray later.  - Daily controller medication(s): Symbicort 160/4.53mcg two puffs twice daily with spacer - Prior to physical activity: ProAir 2 puffs 10-15 minutes before physical activity. - Rescue medications: ProAir 4 puffs every 4-6 hours as needed or albuterol nebulizer one vial every 4-6 hours as needed - Asthma control goals:  * Full participation in all desired activities (may need albuterol before activity) * Albuterol use two time or less a week on average (not counting use with activity) * Cough interfering with sleep two time or less a month * Oral steroids no more than once a year * No hospitalizations  2. Chronic rhinitis - Continue taking: Zyrtec (cetirizine) 10mg  tablet once daily and Flonase (fluticasone) two sprays per nostril daily - You can use an extra dose of the antihistamine, if needed, for breakthrough symptoms.  - Consider nasal saline rinses 1-2 times daily to remove allergens from the nasal cavities as well as help with mucous clearance (this is especially helpful to do before the nasal sprays are given). - Continue with Mucinex if needed for congestion.   3. Return in about 6 months (around 02/05/2020). This can be an in-person, a virtual Webex or a telephone follow up visit.  Subjective:   [name] is a 74 y.o. female presenting today for follow up of  Chief Complaint  Patient presents with  . Asthma    [name] has a history of the following: Patient Active Problem List   Diagnosis Date Noted  . Moderate persistent asthma with acute exacerbation 12/05/2017  . Perennial  allergic rhinitis 09/20/2017  . MDD (major depressive disorder) 07/31/2017  . Insomnia 02/23/2015  . OSA (obstructive sleep apnea) 02/23/2015  . Asthma 02/23/2015  . Chest pain on exertion 04/10/2013  . Menopausal hot flushes 01/22/2013  . Frequency of urination 01/22/2013    History obtained from: chart review and patient.  [name] is a 74 y.o. female presenting for a follow up visit.  She was last seen in December 2020.  At that time, her lung testing was stable.  We did give her albuterol with improvement in her spirometry.  We started her on prednisone and recommended restarting the Symbicort 2 puffs twice daily.  For her rhinitis, we continued with Zyrtec and Flonase.  We recommended Mucinex for nasal congestion.  Since last visit, she reports that she has mostly done well.  Asthma/Respiratory Symptom History: She remains on the Symbicort 2 puffs in the morning and 2 puffs at night.  She has been more compliant with it.  She said that she never misses a dose.  She uses her rescue inhaler every 2 weeks or so.  She does report a 2-week history of bilateral costo chondral pain at the bases of her lungs. ACT is 16 today, indicating excellent asthma control  She does report lower bilateral chest pain. She reports that this fairly constant. She has not tried using the rescue inhaler to see if it helps. Pain described as a 7/10. She has not had a CXR. She has had no fever. She did have a CXR in the "heart area".  She denies any injuries. She feels that her rib cage. She has been using Robitussin from [facility].   Allergic Rhinitis Symptom History: She "knows" her sinuses are swollen. She is using her allergy medication. She also is on the Mucinex.  She has not needed antibiotics or prednisone in several months.  She does report some difficulty sleeping, but she has brought this up with her PCP.  She is not on any sleeping medication yet.  She does not meditate prior to bedtime.  Otherwise, there  have been no changes to her past medical history, surgical history, family history, or social history.    Review of Systems  Constitutional: Negative.  Negative for chills, fever, malaise/fatigue and weight loss.  HENT: Negative.  Negative for congestion, ear discharge and ear pain.   Eyes: Negative for pain, discharge and redness.  Respiratory: Negative for cough, sputum production, shortness of breath and wheezing.        Positive for chest pain.  Cardiovascular: Negative.  Negative for chest pain and palpitations.  Gastrointestinal: Negative for abdominal pain, heartburn, nausea and vomiting.  Skin: Negative.  Negative for itching and rash.  Neurological: Negative for dizziness and headaches.  Endo/Heme/Allergies: Negative for environmental allergies. Does not bruise/bleed easily.       Objective:   Blood pressure 112/82, pulse 98, temperature 97.8 F (36.6 C), temperature source Temporal, resp. rate 16, SpO2 96 %. There is no height or weight on file to calculate BMI.   Physical Exam:  Physical Exam  Constitutional: She appears well-developed.  Pleasant female.   HENT:  Head: Normocephalic and atraumatic.  Right Ear: Tympanic membrane, external ear and ear canal normal.  Left Ear: Tympanic membrane, external ear and ear canal normal.  Nose: Mucosal edema and rhinorrhea present. No nasal deformity or septal deviation. No epistaxis. Right sinus exhibits no maxillary sinus tenderness and no frontal sinus tenderness. Left sinus exhibits no maxillary sinus tenderness and no frontal sinus tenderness.  Mouth/Throat: Uvula is midline and oropharynx is clear and moist. Mucous membranes are not pale and not dry.  Turbinates enlarged bilaterally.  Eyes: Pupils are equal, round, and reactive to light. Conjunctivae and EOM are normal. Right eye exhibits no chemosis and no discharge. Left eye exhibits no chemosis and no discharge. Right conjunctiva is not injected. Left conjunctiva is not  injected.  Cardiovascular: Normal rate, regular rhythm and normal heart sounds.  Respiratory: Effort normal and breath sounds normal. No accessory muscle usage. No tachypnea. No respiratory distress. She has no wheezes. She has no rhonchi. She has no rales. She exhibits no tenderness.  Moving air well in all lung fields. No increased work of breathing. She reports tenderness on the bilateral flanks of the costochondral angles. No guarding.   Lymphadenopathy:    She has no cervical adenopathy.  Neurological: She is alert.  Skin: No abrasion, no petechiae and no rash noted. Rash is not papular, not vesicular and not urticarial. No erythema. No pallor.  No eczematous or urticarial lesions noted.  Psychiatric: She has a normal mood and affect.     Diagnostic studies:    Spirometry: results normal (FEV1: 1.59/112%, FVC: 1.83/99%, FEV1/FVC: 87%).    Spirometry consistent with normal pattern.    Allergy Studies: none        Malachi Bonds, MD  [facility]

## 2019-08-05 NOTE — Patient Instructions (Addendum)
1. Moderate persistent asthma, uncomplicated - We are going to get a chest X ray to look for any rib fractures.  - Lung testing look VERY good.  - We will call you with results of the chest X-ray later.  - Daily controller medication(s): Symbicort 160/4.53mcg two puffs twice daily with spacer - Prior to physical activity: ProAir 2 puffs 10-15 minutes before physical activity. - Rescue medications: ProAir 4 puffs every 4-6 hours as needed or albuterol nebulizer one vial every 4-6 hours as needed - Asthma control goals:  * Full participation in all desired activities (may need albuterol before activity) * Albuterol use two time or less a week on average (not counting use with activity) * Cough interfering with sleep two time or less a month * Oral steroids no more than once a year * No hospitalizations  2. Chronic rhinitis - Continue taking: Zyrtec (cetirizine) 10mg  tablet once daily and Flonase (fluticasone) two sprays per nostril daily - You can use an extra dose of the antihistamine, if needed, for breakthrough symptoms.  - Consider nasal saline rinses 1-2 times daily to remove allergens from the nasal cavities as well as help with mucous clearance (this is especially helpful to do before the nasal sprays are given). - Continue with Mucinex if needed for congestion.   3. Return in about 6 months (around 02/05/2020). This can be an in-person, a virtual Webex or a telephone follow up visit.   Please inform 02/07/2020 of any Emergency Department visits, hospitalizations, or changes in symptoms. Call us before going to the ED for breathing or allergy symptoms since we might be able to fit you in for a sick visit. Feel free to contact us anytime with any questions, problems, or concerns.  It was a pleasure to see you again today!  Websites that have reliable patient information: 1. American Academy of Asthma, Allergy, and Immunology: www.aaaai.org 2. Food Allergy Research and Education (FARE):  foodallergy.org 3. Mothers of Asthmatics: http://www.asthmacommunitynetwork.org 4. American College of Allergy, Asthma, and Immunology: www.acaai.org   COVID-19 Vaccine Information can be found at: [place] For questions related to vaccine distribution or appointments, please email [email or web address] or call [phone].     "Like" [phone] on Facebook and Instagram for our latest updates!       HAPPY SPRING!  Make sure you are registered to vote! If you have moved or changed any of your contact information, you will need to get this updated before voting!  In some cases, you MAY be able to register to vote online: [place]

## 2019-08-11 ENCOUNTER — Telehealth: Payer: Self-pay | Admitting: Allergy & Immunology

## 2019-08-11 MED ORDER — AMOXICILLIN-POT CLAVULANATE 875-125 MG PO TABS: 1.0000 | ORAL_TABLET | Freq: Two times a day (BID) | ORAL | 0 refills | Status: AC

## 2019-08-11 NOTE — Telephone Encounter (Signed)
Patient informed of chest xray. She says that she was coughing more after the xray. Coughing up some phlegm. She is feeling very tired and weak. She says that she is coughing up white phlegm that is very thick. Patient denies fever. She does have body aches and aches all over.

## 2019-08-11 NOTE — Addendum Note (Signed)
Addended by: Mliss Fritz I on: 08/11/2019 11:36 AM   Modules accepted: Orders

## 2019-08-11 NOTE — Telephone Encounter (Signed)
We gave it a good shot. Let's add on Augmentin BID for seven days.  Malachi Bonds, MD [facility]

## 2019-08-11 NOTE — Telephone Encounter (Signed)
Patient is returning a call from Thursday about a x ray. [phone].

## 2019-08-11 NOTE — Telephone Encounter (Signed)
Prescription sent in for the requested medication. Patient aware and instructed to drink plenty of water.

## 2019-09-17 ENCOUNTER — Other Ambulatory Visit: Payer: Medicare Other

## 2019-12-15 ENCOUNTER — Telehealth: Payer: Self-pay

## 2019-12-15 MED ORDER — BUDESONIDE-FORMOTEROL FUMARATE 160-4.5 MCG/ACT IN AERO: 2.0000 | INHALATION_SPRAY | Freq: Two times a day (BID) | RESPIRATORY_TRACT | 5 refills | Status: DC

## 2019-12-15 NOTE — Telephone Encounter (Signed)
After spending 1.5 hour on the phone was able to get the PA approved through [facility]. Case # [id number] from November 15, 2019 to August 9th, 2022

## 2019-12-15 NOTE — Telephone Encounter (Signed)
[facility] pharmacy notified and Rx refills for Symbicort were sent in.

## 2019-12-15 NOTE — Telephone Encounter (Signed)
Will work on prior authorization

## 2019-12-15 NOTE — Telephone Encounter (Signed)
Patient is at the pharmacy and the pharmacist informed her insurance is requiring a prior auth for Symbicort this month. Have we received this request, if so has it been started?  Please call the patient to give her an update.   Thanks

## 2019-12-15 NOTE — Telephone Encounter (Signed)
PA was initiated and pending approval will contact patient to notify them of status.

## 2019-12-15 NOTE — Telephone Encounter (Signed)
Patient has new address: [address], [place], [place] [address] Her medicare ID is [id number], PCN: [id number], Group #[id number]: [id number] [facility] contact number is [phone]

## 2019-12-22 NOTE — Telephone Encounter (Signed)
Patient states that Symbicort was denied and she needs a letter from Dr. Dellis Anes stating that Symbicort is essential in order for her to file an appeal. Patient would like letter mailed to [facility] in [place], [place] [address].  Please advise

## 2019-12-22 NOTE — Telephone Encounter (Signed)
Patient was confused and believed it was still denied even though PA was approved. Advise patient that medication was already approved and no letter is required just to pick up Rx at the pharmacy.

## 2020-02-03 ENCOUNTER — Other Ambulatory Visit: Payer: Self-pay

## 2020-02-03 ENCOUNTER — Ambulatory Visit (INDEPENDENT_AMBULATORY_CARE_PROVIDER_SITE_OTHER): Payer: Medicare Other | Admitting: Allergy and Immunology

## 2020-02-03 ENCOUNTER — Encounter: Payer: Self-pay | Admitting: Allergy and Immunology

## 2020-02-03 VITALS — BP 130/86 | HR 100 | Temp 97.8°F | Resp 18 | Ht 61.0 in

## 2020-02-03 DIAGNOSIS — J3089 Other allergic rhinitis: Secondary | ICD-10-CM | POA: Diagnosis not present

## 2020-02-03 DIAGNOSIS — J4540 Moderate persistent asthma, uncomplicated: Secondary | ICD-10-CM

## 2020-02-03 NOTE — Patient Instructions (Addendum)
  1.  Continue Symbicort-2 inhalations twice a day with spacer  2.  Start Spiriva 1.25 Respimat-2 inhalations once a day  3.  Start montelukast 10 mg - 1 tablet once a day  4.  Continue Nasacort / Flonase - 1 spray each nostril once a day  5.  If needed:   A. Albuterol HFA - 2 inhalations every 4-6 hours  B. Cetirizine 10 mg - 1 tablet 1 time per day  6. Obtain fall flu vaccine  7. Return to clinic in 4 weeks. Further treatment?

## 2020-02-03 NOTE — Progress Notes (Signed)
[place] - [place] - [place] - [place] - [place]   Follow-up Note  Referring Provider: Jackie Plum, MD Primary Provider: Jackie Plum, MD Date of Office Visit: 02/03/2020  Subjective:   [name] (DOB: [birth year]) is a 74 y.o. female who [facility]2020 in re-evaluation of the following:  HPI: Symphonie returns to this clinic in evaluation of asthma and rhinitis.  She was last seen in this clinic by Dr. Dellis Anes on 05 August 2019.  She continues to have issues with wheezing and shortness of breath and coughing that flares up about every 2 weeks for usually just a few days per episode.  As well, she does have a large requirement for bronchodilator especially if she exerts herself.  This requirement can be daily on occasion.  It does not sound as though she has required a systemic steroid to treat an exacerbation of her asthma since her last visit.  She does continue on Symbicort consistently.  Sometimes she uses Symbicort 3 times per day.  She has had some postnasal drip and some occasional nasal drip and some occasional nasal congestion without much sneezing or anosmia or ugly nasal discharge.  She only uses her Nasacort a few times per week.  She has obtained 2 Covid vaccinations.  Allergies as of 02/03/2020   No Known Allergies     Medication List      albuterol (2.5 MG/3ML) 0.083% nebulizer solution Commonly known as: PROVENTIL Take 3 mLs (2.5 mg total) by nebulization every 4 (four) hours as needed for wheezing or shortness of breath.   albuterol 108 (90 Base) MCG/ACT inhaler Commonly known as: VENTOLIN HFA INHALE 2 PUFFS BY MOUTH EVERY 4 HOURS AS NEEDED FOR WHEEZING OR SHORTNESS OF BREATH. PLEASE KEEP SCHEDULED OFFICE VISIT FOR FURTHER REFILLS.   amLODipine 2.5 MG tablet Commonly known as: NORVASC Take 2.5 mg by mouth daily.   budesonide-formoterol 160-4.5 MCG/ACT inhaler Commonly known as:  Symbicort Inhale 2 puffs into the lungs 2 (two) times daily.   esomeprazole 40 MG capsule Commonly known as: NEXIUM Take 40 mg by mouth daily at 12 noon.   furosemide 40 MG tablet Commonly known as: LASIX Take 40 mg by mouth daily.   hydrALAZINE 25 MG tablet Commonly known as: APRESOLINE Take 25 mg by mouth 2 (two) times daily.   Ingrezza 80 MG Caps Generic drug: Valbenazine Tosylate Take 1 capsule by mouth daily.   ketoconazole 2 % shampoo Commonly known as: NIZORAL APPLY ONCE TOPICALLY FOR 3 DAYS   meloxicam 15 MG tablet Commonly known as: MOBIC Take 15 mg by mouth daily.   metoprolol succinate 25 MG 24 hr tablet Commonly known as: TOPROL-XL TAKE 1 TABLET BY MOUTH ONCE DAILY FOR 30 DAYS   nitroGLYCERIN 0.3 MG SL tablet Commonly known as: NITROSTAT SMARTSIG:1 Tablet(s) Sublingual PRN   nitroGLYCERIN 0.4 MG SL tablet Commonly known as: NITROSTAT Place 0.4 mg under the tongue every 5 (five) minutes as needed for chest pain.   omeprazole 20 MG capsule Commonly known as: PRILOSEC Take 20 mg by mouth daily.   Rexulti 2 MG Tabs tablet Generic drug: brexpiprazole Take 2 mg by mouth daily.   RisperDAL 4 MG tablet Generic drug: risperidone Take 4 mg by mouth at bedtime.       Past Medical History:  Diagnosis Date  . Allergy   . Asthma   . Chronic headaches   . Endometriosis   . Heart attack (HCC)   .  Hypertension   . Mood swings     Past Surgical History:  Procedure Laterality Date  . ABDOMINAL HYSTERECTOMY    . EXPLORATORY LAPAROTOMY    . I & D EXTREMITY Left 08/04/2017   Procedure: IRRIGATION AND DEBRIDEMENT HAND;  Surgeon: Betha Loa, MD;  Location: [facility] OR;  Service: Orthopedics;  Laterality: Left;  . LEFT HEART CATHETERIZATION WITH CORONARY ANGIOGRAM N/A 04/10/2013   Procedure: LEFT HEART CATHETERIZATION WITH CORONARY ANGIOGRAM;  Surgeon: Ricki Rodriguez, MD;  Location: [facility] CATH LAB;  Service: Cardiovascular;  Laterality: N/A;    Review of systems  negative except as noted in HPI / PMHx or noted below:  Review of Systems  Constitutional: Negative.   HENT: Negative.   Eyes: Negative.   Respiratory: Negative.   Cardiovascular: Negative.   Gastrointestinal: Negative.   Genitourinary: Negative.   Musculoskeletal: Negative.   Skin: Negative.   Neurological: Negative.   Endo/Heme/Allergies: Negative.   Psychiatric/Behavioral: Negative.      Objective:   Vitals:   02/03/20 0957  BP: 130/86  Pulse: 100  Resp: 18  Temp: 97.8 F (36.6 C)  SpO2: 98%   Height: 5\' 1"  (154.9 cm)      Physical Exam Constitutional:      Appearance: She is not diaphoretic.  HENT:     Head: Normocephalic.     Right Ear: Tympanic membrane, ear canal and external ear normal.     Left Ear: Tympanic membrane, ear canal and external ear normal.     Nose: Nose normal. No mucosal edema or rhinorrhea.     Mouth/Throat:     Pharynx: Uvula midline. No oropharyngeal exudate.  Eyes:     Conjunctiva/sclera: Conjunctivae normal.  Neck:     Thyroid: No thyromegaly.     Trachea: Trachea normal. No tracheal tenderness or tracheal deviation.  Cardiovascular:     Rate and Rhythm: Normal rate and regular rhythm.     Heart sounds: Normal heart sounds, S1 normal and S2 normal. No murmur heard.   Pulmonary:     Effort: No respiratory distress.     Breath sounds: Normal breath sounds. No stridor. No wheezing or rales.  Lymphadenopathy:     Head:     Right side of head: No tonsillar adenopathy.     Left side of head: No tonsillar adenopathy.     Cervical: No cervical adenopathy.  Skin:    Findings: No erythema or rash.     Nails: There is no clubbing.  Neurological:     Mental Status: She is alert.     Diagnostics:    Spirometry was performed and demonstrated an FEV1 of 1.37 at 90 % of predicted.  The patient had an Asthma Control Test with the following results: ACT Total Score: 15.    Assessment and Plan:   1. Not well controlled moderate  persistent asthma   2. Perennial allergic rhinitis     1.  Continue Symbicort-2 inhalations twice a day with spacer  2.  Start Spiriva 1.25 Respimat-2 inhalations once a day  3.  Start montelukast 10 mg - 1 tablet once a day  4.  Continue Nasacort / Flonase - 1 spray each nostril once a day  5.  If needed:   A. Albuterol HFA - 2 inhalations every 4-6 hours  B. Cetirizine 10 mg - 1 tablet 1 time per day  6. Obtain fall flu vaccine  7. Return to clinic in 4 weeks. Further treatment?   Johnesha appears to  have continued inflammation of her respiratory track on her current medical plan and we will introduce a anticholinergic inhalation agent and a leukotriene modifier and we encouraged her to consistently use a little bit more nasal steroid to address these issues.  I will see her back in this clinic in 4 weeks to assess her response and consider further evaluation and treatment based upon her response.  Laurette Schimke, MD Allergy / Immunology [facility]

## 2020-02-04 ENCOUNTER — Encounter: Payer: Self-pay | Admitting: Allergy and Immunology

## 2020-02-04 MED ORDER — BUDESONIDE-FORMOTEROL FUMARATE 160-4.5 MCG/ACT IN AERO: 2.0000 | INHALATION_SPRAY | Freq: Two times a day (BID) | RESPIRATORY_TRACT | 1 refills | Status: DC

## 2020-02-04 MED ORDER — FLUTICASONE PROPIONATE 50 MCG/ACT NA SUSP: 2.0000 | Freq: Every day | NASAL | 1 refills | Status: DC

## 2020-02-04 MED ORDER — MONTELUKAST SODIUM 10 MG PO TABS: 10.0000 mg | ORAL_TABLET | Freq: Every day | ORAL | 5 refills | Status: DC

## 2020-02-04 MED ORDER — SPIRIVA RESPIMAT 1.25 MCG/ACT IN AERS: 2.0000 | INHALATION_SPRAY | Freq: Every day | RESPIRATORY_TRACT | 1 refills | Status: DC

## 2020-02-10 ENCOUNTER — Telehealth: Payer: Self-pay | Admitting: Allergy and Immunology

## 2020-02-10 MED ORDER — INCRUSE ELLIPTA 62.5 MCG/INH IN AEPB: 1.0000 | INHALATION_SPRAY | Freq: Every day | RESPIRATORY_TRACT | 1 refills | Status: DC

## 2020-02-10 NOTE — Telephone Encounter (Signed)
Please advise 

## 2020-02-10 NOTE — Telephone Encounter (Signed)
Incruse Ellipta can be substituted for Spiriva.  Please inform patient

## 2020-02-10 NOTE — Telephone Encounter (Signed)
Incruse sent. Left message for patient to call back.

## 2020-02-10 NOTE — Telephone Encounter (Signed)
Howard from [employer] states insurance does not cover spiriva respimat and would like to know if the alternative Incruse Ellipta can be prescribed. Reference #:[id number] and call back phone is [phone].  Please advise.

## 2020-02-11 ENCOUNTER — Telehealth: Payer: Self-pay

## 2020-02-11 NOTE — Telephone Encounter (Signed)
[facility] calling to let us know that the pt's  Plan doesn't cover spiriva respimat 1.25 mcg, but does cover incruise ellipta 62.5 do you want the pt. To change to the incruise ellipta or do a PA for the spiriva ellipta.1.25 mcg

## 2020-02-11 NOTE — Telephone Encounter (Signed)
I sent a message about this issue yesterday; please look at chart

## 2020-02-11 NOTE — Telephone Encounter (Signed)
Please advise to change? 

## 2020-02-24 NOTE — Telephone Encounter (Signed)
incruise was sent in. Pt. Was left a message that it was sent in on October 5th.

## 2020-03-09 ENCOUNTER — Ambulatory Visit: Payer: Medicare Other | Admitting: Allergy and Immunology

## 2020-06-12 ENCOUNTER — Emergency Department (HOSPITAL_COMMUNITY)
Admission: EM | Admit: 2020-06-12 | Discharge: 2020-06-12 | Disposition: A | Discharge status: Home / Self Care | Payer: Medicare Other | Attending: Emergency Medicine | Admitting: Emergency Medicine

## 2020-06-12 ENCOUNTER — Other Ambulatory Visit: Payer: Self-pay

## 2020-06-12 ENCOUNTER — Encounter (HOSPITAL_COMMUNITY): Payer: Self-pay | Admitting: Emergency Medicine

## 2020-06-12 ENCOUNTER — Emergency Department (HOSPITAL_COMMUNITY): Payer: Medicare Other

## 2020-06-12 DIAGNOSIS — R531 Weakness: Secondary | ICD-10-CM | POA: Insufficient documentation

## 2020-06-12 DIAGNOSIS — Z5321 Procedure and treatment not carried out due to patient leaving prior to being seen by health care provider: Secondary | ICD-10-CM | POA: Diagnosis not present

## 2020-06-12 DIAGNOSIS — R479 Unspecified speech disturbances: Secondary | ICD-10-CM | POA: Diagnosis not present

## 2020-06-12 DIAGNOSIS — R410 Disorientation, unspecified: Secondary | ICD-10-CM | POA: Diagnosis not present

## 2020-06-12 LAB — CBC
HCT: 34.6 % — ABNORMAL LOW (ref 36.0–46.0)
Hemoglobin: 12.4 g/dL (ref 12.0–15.0)
MCH: 31.4 pg (ref 26.0–34.0)
MCHC: 35.8 g/dL (ref 30.0–36.0)
MCV: 87.6 fL (ref 80.0–100.0)
Platelets: 278 10*3/uL (ref 150–400)
RBC: 3.95 MIL/uL (ref 3.87–5.11)
RDW: 13.9 % (ref 11.5–15.5)
WBC: 6.3 10*3/uL (ref 4.0–10.5)
nRBC: 0 % (ref 0.0–0.2)

## 2020-06-12 LAB — DIFFERENTIAL
Abs Immature Granulocytes: 0.01 10*3/uL (ref 0.00–0.07)
Basophils Absolute: 0 10*3/uL (ref 0.0–0.1)
Basophils Relative: 1 %
Eosinophils Absolute: 0.1 10*3/uL (ref 0.0–0.5)
Eosinophils Relative: 1 %
Immature Granulocytes: 0 %
Lymphocytes Relative: 38 %
Lymphs Abs: 2.4 10*3/uL (ref 0.7–4.0)
Monocytes Absolute: 0.4 10*3/uL (ref 0.1–1.0)
Monocytes Relative: 6 %
Neutro Abs: 3.5 10*3/uL (ref 1.7–7.7)
Neutrophils Relative %: 54 %

## 2020-06-12 LAB — COMPREHENSIVE METABOLIC PANEL
ALT: 15 U/L (ref 0–44)
AST: 18 U/L (ref 15–41)
Albumin: 3.3 g/dL — ABNORMAL LOW (ref 3.5–5.0)
Alkaline Phosphatase: 81 U/L (ref 38–126)
Anion gap: 11 (ref 5–15)
BUN: 11 mg/dL (ref 8–23)
CO2: 23 mmol/L (ref 22–32)
Calcium: 8.5 mg/dL — ABNORMAL LOW (ref 8.9–10.3)
Chloride: 106 mmol/L (ref 98–111)
Creatinine, Ser: 0.98 mg/dL (ref 0.44–1.00)
GFR, Estimated: >60 mL/min (ref >60)
Glucose, Bld: 128 mg/dL — ABNORMAL HIGH (ref 70–99)
Potassium: 3.6 mmol/L (ref 3.5–5.1)
Sodium: 140 mmol/L (ref 135–145)
Total Bilirubin: 1 mg/dL (ref 0.3–1.2)
Total Protein: 6.4 g/dL — ABNORMAL LOW (ref 6.5–8.1)

## 2020-06-12 LAB — APTT: aPTT: 26 seconds (ref 24–36)

## 2020-06-12 LAB — PROTIME-INR
INR: 1.1 (ref 0.8–1.2)
Prothrombin Time: 13.6 seconds (ref 11.4–15.2)

## 2020-06-12 LAB — CBG MONITORING, ED: Glucose-Capillary: 125 mg/dL — ABNORMAL HIGH (ref 70–99)

## 2020-06-12 MED ORDER — SODIUM CHLORIDE 0.9% FLUSH
3.0000 mL | Freq: Once | INTRAVENOUS | Status: DC
Start: 2020-06-12 — End: 2020-06-13

## 2020-06-12 NOTE — ED Notes (Signed)
Pt is concerned about the time. She states that she has a curfew where she lives and if she doesn't leave soon she will have nowhere to go. Pt encouraged to stay and be seen, but she states that she will have to come in tomorrow instead.

## 2020-06-12 NOTE — ED Triage Notes (Signed)
Pt reports weakness to bilateral arms and legs that is worse at night, confusion, and trouble with speech x 4 days.  States she believes it is a reaction to an injection she received a week ago that her doctor told her was "vitamins" but the person that gave the shot said it wasn't.  Pt unsure name of medication.

## 2020-06-13 ENCOUNTER — Other Ambulatory Visit: Payer: Self-pay

## 2020-06-13 ENCOUNTER — Emergency Department (HOSPITAL_COMMUNITY)
Admission: EM | Admit: 2020-06-13 | Discharge: 2020-06-13 | Disposition: A | Discharge status: Home / Self Care | Payer: Medicare Other | Attending: Emergency Medicine | Admitting: Emergency Medicine

## 2020-06-13 DIAGNOSIS — R531 Weakness: Secondary | ICD-10-CM | POA: Insufficient documentation

## 2020-06-13 DIAGNOSIS — R5381 Other malaise: Secondary | ICD-10-CM | POA: Diagnosis not present

## 2020-06-13 DIAGNOSIS — R5382 Chronic fatigue, unspecified: Secondary | ICD-10-CM | POA: Diagnosis not present

## 2020-06-13 DIAGNOSIS — Z79899 Other long term (current) drug therapy: Secondary | ICD-10-CM | POA: Diagnosis not present

## 2020-06-13 DIAGNOSIS — Z87891 Personal history of nicotine dependence: Secondary | ICD-10-CM | POA: Diagnosis not present

## 2020-06-13 DIAGNOSIS — J4541 Moderate persistent asthma with (acute) exacerbation: Secondary | ICD-10-CM | POA: Insufficient documentation

## 2020-06-13 DIAGNOSIS — R202 Paresthesia of skin: Secondary | ICD-10-CM | POA: Diagnosis not present

## 2020-06-13 DIAGNOSIS — I10 Essential (primary) hypertension: Secondary | ICD-10-CM | POA: Diagnosis not present

## 2020-06-13 NOTE — ED Triage Notes (Signed)
Pt seen in ED yesterday and LWBS due to wait.- C/o weakness to bilateral arms and legs that is worse at night, confusion, and trouble with speech over the last 5 days.  Pt believed this was related to an injection she received.  Unsure of name of medication.  Pt states she is feeling better today and was told by staff to return this morning.  See labs and CT from yesterday.

## 2020-06-13 NOTE — ED Provider Notes (Signed)
[facility] EMERGENCY DEPARTMENT Provider Note   CSN: [id number] Arrival date & time: 06/13/20  0818     History No chief complaint on file.   [name] is a 75 y.o. female.  Presents with chief complaint of generalized malaise weakness, she describes it as "my spirits being down."  She states symptoms been going on for about 7 to 10 days.  She received a injection in her arm by her doctor she does not recall the name of.  She is convinced that this injection has made her feel weak and fatigued for the past week.  She was here in the ER yesterday but left that being seen.  States that she started to feel better but was told to come back to be fully checked out.  She denies headache or chest pain abdominal pain.  No fever no vomiting cough no diarrhea.  Patient also states that she has had episodes of bilateral upper and lower extremity weakness and numbness and tingling that have been intermittent throughout the past week.  Currently denies any symptoms.        Past Medical History:  Diagnosis Date  . Allergy   . Asthma   . Chronic headaches   . Endometriosis   . Heart attack (HCC)   . Hypertension   . Mood swings     Patient Active Problem List   Diagnosis Date Noted  . Moderate persistent asthma with acute exacerbation 12/05/2017  . Perennial allergic rhinitis 09/20/2017  . MDD (major depressive disorder) 07/31/2017  . Insomnia 02/23/2015  . OSA (obstructive sleep apnea) 02/23/2015  . Asthma 02/23/2015  . Chest pain on exertion 04/10/2013  . Menopausal hot flushes 01/22/2013  . Frequency of urination 01/22/2013    Past Surgical History:  Procedure Laterality Date  . ABDOMINAL HYSTERECTOMY    . EXPLORATORY LAPAROTOMY    . I & D EXTREMITY Left 08/04/2017   Procedure: IRRIGATION AND DEBRIDEMENT HAND;  Surgeon: Betha Loa, MD;  Location: [facility] OR;  Service: Orthopedics;  Laterality: Left;  . LEFT HEART CATHETERIZATION WITH CORONARY ANGIOGRAM N/A  04/10/2013   Procedure: LEFT HEART CATHETERIZATION WITH CORONARY ANGIOGRAM;  Surgeon: Ricki Rodriguez, MD;  Location: [facility] CATH LAB;  Service: Cardiovascular;  Laterality: N/A;     OB History   No obstetric history on file.     Family History  Problem Relation Age of Onset  . Cancer Mother   . Heart disease Mother   . Cancer Father   . Heart disease Father     Social History   Tobacco Use  . Smoking status: Former Smoker    Types: Cigarettes  . Smokeless tobacco: Never Used  Vaping Use  . Vaping Use: Never used  Substance Use Topics  . Alcohol use: Yes    Comment: occasional  . Drug use: No    Home Medications Prior to Admission medications   Medication Sig Start Date End Date Taking? Authorizing Provider  albuterol (PROVENTIL) (2.5 MG/3ML) 0.083% nebulizer solution Take 3 mLs (2.5 mg total) by nebulization every 4 (four) hours as needed for wheezing or shortness of breath. 02/15/18  Yes Alfonse Spruce, MD  albuterol (VENTOLIN HFA) 108 (90 Base) MCG/ACT inhaler INHALE 2 PUFFS BY MOUTH EVERY 4 HOURS AS NEEDED FOR WHEEZING OR SHORTNESS OF BREATH. PLEASE KEEP SCHEDULED OFFICE VISIT FOR FURTHER REFILLS. 08/04/19  Yes Alfonse Spruce, MD  amLODipine (NORVASC) 2.5 MG tablet Take 2.5 mg by mouth daily. 12/10/17  Yes  [provider]  benztropine (COGENTIN) 1 MG tablet Take 1 mg by mouth 2 (two) times daily. 05/07/20  Yes [provider]  budesonide-formoterol (SYMBICORT) 160-4.5 MCG/ACT inhaler Inhale 2 puffs into the lungs 2 (two) times daily. 02/04/20  Yes Kozlow, Alvira Philips, MD  hydrALAZINE (APRESOLINE) 25 MG tablet Take 25 mg by mouth 2 (two) times daily. 01/03/18  Yes [provider]  INGREZZA 80 MG CAPS Take 1 capsule by mouth daily. 01/16/20  Yes [provider]  meloxicam (MOBIC) 15 MG tablet Take 15 mg by mouth daily. 04/24/19  Yes [provider]  metoprolol succinate (TOPROL-XL) 25 MG 24 hr tablet TAKE 1 TABLET BY MOUTH ONCE  DAILY FOR 30 DAYS 09/28/17  Yes [provider]  nitroGLYCERIN (NITROSTAT) 0.3 MG SL tablet Place 0.3 mg under the tongue every 5 (five) minutes as needed for chest pain. 12/17/19  Yes [provider]  omeprazole (PRILOSEC) 20 MG capsule Take 20 mg by mouth daily. 12/15/19  Yes [provider]  pantoprazole (PROTONIX) 20 MG tablet Take 20 mg by mouth daily. 03/11/20  Yes [provider]  sucralfate (CARAFATE) 1 g tablet Take 1 g by mouth 4 (four) times daily. 03/11/20  Yes [provider]  Tiotropium Bromide Monohydrate (SPIRIVA RESPIMAT) 1.25 MCG/ACT AERS Inhale 2 puffs into the lungs daily. 02/04/20  Yes Kozlow, Alvira Philips, MD  umeclidinium bromide (INCRUSE ELLIPTA) 62.5 MCG/INH AEPB Inhale 1 puff into the lungs daily. 02/10/20  Yes Kozlow, Alvira Philips, MD  fluticasone (FLONASE) 50 MCG/ACT nasal spray Place 2 sprays into both nostrils daily. Patient not taking: No sig reported 02/04/20   Kozlow, Alvira Philips, MD  montelukast (SINGULAIR) 10 MG tablet Take 1 tablet (10 mg total) by mouth at bedtime. Patient not taking: No sig reported 02/04/20   Kozlow, Alvira Philips, MD    Allergies    Patient has no known allergies.  Review of Systems   Review of Systems  Constitutional: Negative for fever.  HENT: Negative for ear pain.   Eyes: Negative for pain.  Respiratory: Negative for cough.   Cardiovascular: Negative for chest pain.  Gastrointestinal: Negative for abdominal pain.  Genitourinary: Negative for flank pain.  Musculoskeletal: Negative for back pain.  Skin: Negative for rash.  Neurological: Negative for headaches.    Physical Exam Updated Vital Signs BP 98/74   Pulse 81   Temp 98.2 F (36.8 C) (Oral)   Resp 14   SpO2 100%   Physical Exam Constitutional:      General: She is not in acute distress.    Appearance: Normal appearance.  HENT:     Head: Normocephalic.     Nose: Nose normal.  Eyes:     Extraocular Movements: Extraocular movements intact.   Cardiovascular:     Rate and Rhythm: Normal rate.  Pulmonary:     Effort: Pulmonary effort is normal.  Musculoskeletal:        General: Normal range of motion.     Cervical back: Normal range of motion.     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  Neurological:     General: No focal deficit present.     Mental Status: She is alert and oriented to person, place, and time. Mental status is at baseline.     Cranial Nerves: No cranial nerve deficit.     Motor: No weakness.     Comments: Weakness and diminished range of motion of  left upper extremity, reportedly patient's baseline.     ED Results / Procedures / Treatments   Labs (all labs ordered are listed, but only abnormal results are displayed) Labs Reviewed  URINALYSIS, ROUTINE W REFLEX MICROSCOPIC    EKG EKG Interpretation  Date/Time:  Sunday June 13 2020 08:29:52 EST Ventricular Rate:  95 PR Interval:  138 QRS Duration: 66 QT Interval:  368 QTC Calculation: 462 R Axis:   92 Text Interpretation: Normal sinus rhythm Rightward axis Nonspecific ST abnormality Abnormal ECG Confirmed by Norman Clay ([id number]) on 06/13/2020 3:26:41 PM   Radiology CT HEAD WO CONTRAST  Result Date: 06/12/2020 CLINICAL DATA:  Acute neuro deficit. EXAM: CT HEAD WITHOUT CONTRAST TECHNIQUE: Contiguous axial images were obtained from the base of the skull through the vertex without intravenous contrast. COMPARISON:  December 15, 2002 FINDINGS: Brain: No evidence of acute infarction, hemorrhage, hydrocephalus, extra-axial collection or mass lesion/mass effect. Vascular: No hyperdense vessel or unexpected calcification. Skull: Normal. Negative for fracture or focal lesion. Sinuses/Orbits: No acute finding. Other: None. IMPRESSION: No acute intracranial abnormality. Electronically Signed   By: Ted Mcalpine M.D.   On: 06/12/2020 15:51    Procedures Procedures   Medications  Ordered in ED Medications - No data to display  ED Course  I have reviewed the triage vital signs and the nursing notes.  Pertinent labs & imaging results that were available during my care of the patient were reviewed by me and considered in my medical decision making (see chart for details).    MDM Rules/Calculators/A&P                          I reviewed test performed yesterday.  White count normal chemistry normal.  CT imaging of the brain performed yesterday was unremarkable.  Patient states her symptoms of generalized fatigue improving at this time.  Etiology of her symptoms unclear.  Patient denies any chest pain or chest discomfort.  EKG performed, shows sinus rhythm no ST elevations no ST depressions, normal rate.  Vital signs remained stable as well.  I do not know what medicine she was injected with, patient states that she was told it was a "vitamin."  Will advise close follow-up with her doctor again within 2 to 3 days, advised immediate return for recurrent worsening symptoms or any additional concerns.   Final Clinical Impression(s) / ED Diagnoses Final diagnoses:  Generalized weakness    Rx / DC Orders ED Discharge Orders    None       Cheryll Cockayne, MD 06/13/20 2217

## 2020-06-28 ENCOUNTER — Other Ambulatory Visit: Payer: Self-pay

## 2020-06-28 ENCOUNTER — Ambulatory Visit (INDEPENDENT_AMBULATORY_CARE_PROVIDER_SITE_OTHER): Payer: Medicare Other | Admitting: Allergy & Immunology

## 2020-06-28 ENCOUNTER — Encounter: Payer: Self-pay | Admitting: Allergy & Immunology

## 2020-06-28 VITALS — BP 138/80 | HR 92 | Temp 97.4°F

## 2020-06-28 DIAGNOSIS — R0981 Nasal congestion: Secondary | ICD-10-CM | POA: Diagnosis not present

## 2020-06-28 DIAGNOSIS — J4541 Moderate persistent asthma with (acute) exacerbation: Secondary | ICD-10-CM | POA: Diagnosis not present

## 2020-06-28 DIAGNOSIS — H6592 Unspecified nonsuppurative otitis media, left ear: Secondary | ICD-10-CM

## 2020-06-28 DIAGNOSIS — J3089 Other allergic rhinitis: Secondary | ICD-10-CM | POA: Diagnosis not present

## 2020-06-28 MED ORDER — AMOXICILLIN-POT CLAVULANATE 875-125 MG PO TABS: 1.0000 | ORAL_TABLET | Freq: Two times a day (BID) | ORAL | 0 refills | Status: AC

## 2020-06-28 MED ORDER — BREZTRI AEROSPHERE 160-9-4.8 MCG/ACT IN AERO: 2.0000 | INHALATION_SPRAY | Freq: Two times a day (BID) | RESPIRATORY_TRACT | 5 refills | Status: AC

## 2020-06-28 NOTE — Patient Instructions (Addendum)
1. Moderate persistent asthma, uncomplicated - Lung testing looks somewhat stable. - We are going to get some labs to look for difficult to control asthma. - We are going to consider the addition of an injectable asthma medication.  - We are going to change you from Symbicort and Spiriva to Breztri (contains both Symbicort and a Spiriva like medication) - Daily controller medication(s): Breztri two puffs twice daily with spacer - Prior to physical activity: ProAir 2 puffs 10-15 minutes before physical activity. - Rescue medications: ProAir 4 puffs every 4-6 hours as needed or albuterol nebulizer one vial every 4-6 hours as needed - Asthma control goals:  * Full participation in all desired activities (may need albuterol before activity) * Albuterol use two time or less a week on average (not counting use with activity) * Cough interfering with sleep two time or less a month * Oral steroids no more than once a year * No hospitalizations  2. Chronic rhinitis - Continue taking: Zyrtec (cetirizine) 10mg  tablet once daily and Flonase (fluticasone) two sprays per nostril daily - You can use an extra dose of the antihistamine, if needed, for breakthrough symptoms.  - Consider nasal saline rinses 1-2 times daily to remove allergens from the nasal cavities as well as help with mucous clearance (this is especially helpful to do before the nasal sprays are given). - Continue with Mucinex if needed for congestion.   3. Acute sinusitis - Start Augmentin 875mg  twice daily for two weeks. - Continue with nasal saline rinses twice daily. - Add on Mucinex twice daily.  - Definitely talk to Dr. about getting a follow up appointment.   4. Return in about 2 months (around 08/26/2020).    Please inform Ezzard Standing of any Emergency Department visits, hospitalizations, or changes in symptoms. Call 08/28/2020 before going to the ED for breathing or allergy symptoms since we might be able to fit you in for a sick  visit. Feel free to contact us anytime with any questions, problems, or concerns.  It was a pleasure to see you again today! I HOPE YOU FEEL BETTER!   Websites that have reliable patient information: 1. American Academy of Asthma, Allergy, and Immunology: www.aaaai.org 2. Food Allergy Research and Education (FARE): foodallergy.org 3. Mothers of Asthmatics: http://www.asthmacommunitynetwork.org 4. American College of Allergy, Asthma, and Immunology: www.acaai.org   COVID-19 Vaccine Information can be found at: [place] For questions related to vaccine distribution or appointments, please email [email or web address] or call [phone].   We realize that you might be concerned about having an allergic reaction to the COVID19 vaccines. To help with that concern, WE ARE OFFERING THE COVID19 VACCINES IN OUR OFFICE! Ask the front desk for dates!     "Like" [email or web address] on Facebook and Instagram for our latest updates!      A healthy democracy works best when [phone] participate! Make sure you are registered to vote! If you have moved or changed any of your contact information, you will need to get this updated before voting!  In some cases, you MAY be able to register to vote online: [place]

## 2020-06-28 NOTE — Progress Notes (Signed)
FOLLOW UP  Date of Service/Encounter:  06/28/20   Assessment:   Moderate persistent asthma without complication  Remote smoking history  Perennial allergic rhinitis  Plan/Recommendations:   1. Moderate persistent asthma, uncomplicated - Lung testing looks somewhat stable. - We are going to get some labs to look for difficult to control asthma. - We are going to consider the addition of an injectable asthma medication.  - We are going to change you from Symbicort and Spiriva to Breztri (contains both Symbicort and a Spiriva like medication) - Daily controller medication(s): Breztri two puffs twice daily with spacer - Prior to physical activity: ProAir 2 puffs 10-15 minutes before physical activity. - Rescue medications: ProAir 4 puffs every 4-6 hours as needed or albuterol nebulizer one vial every 4-6 hours as needed - Asthma control goals:  * Full participation in all desired activities (may need albuterol before activity) * Albuterol use two time or less a week on average (not counting use with activity) * Cough interfering with sleep two time or less a month * Oral steroids no more than once a year * No hospitalizations  2. Chronic rhinitis - Continue taking: Zyrtec (cetirizine) 10mg  tablet once daily and Flonase (fluticasone) two sprays per nostril daily - You can use an extra dose of the antihistamine, if needed, for breakthrough symptoms.  - Consider nasal saline rinses 1-2 times daily to remove allergens from the nasal cavities as well as help with mucous clearance (this is especially helpful to do before the nasal sprays are given). - Continue with Mucinex if needed for congestion.   3. Acute sinusitis - Start Augmentin 875mg  twice daily for two weeks. - Continue with nasal saline rinses twice daily. - Add on Mucinex twice daily.  - Definitely talk to Dr. about getting a follow up appointment.   4. Return in about 2 months (around 08/26/2020).     Subjective:   [name] is a 75 y.o. female presenting today for follow up of  Chief Complaint  Patient presents with  . Asthma    [name] has a history of the following: Patient Active Problem List   Diagnosis Date Noted  . Moderate persistent asthma with acute exacerbation 12/05/2017  . Perennial allergic rhinitis 09/20/2017  . MDD (major depressive disorder) 07/31/2017  . Insomnia 02/23/2015  . OSA (obstructive sleep apnea) 02/23/2015  . Asthma 02/23/2015  . Chest pain on exertion 04/10/2013  . Menopausal hot flushes 01/22/2013  . Frequency of urination 01/22/2013    History obtained from: chart review and patient.  [name] is a 75 y.o. female presenting for a sick visit.  She was last seen in September 2021 by Dr. 66.  At that time, she was using her Symbicort sometimes 3 times a day due to intermittent exacerbations.  He started her on Spiriva 1.25 mcg 2 puffs once daily as well as Singulair 10 mg daily in combination with her Symbicort 2 puffs twice daily.  She was continued on cetirizine as needed and Flonase as needed.  I last saw her on March 2021.  At that time, we got a chest x-ray because she was concerned with rib fractures.  She tells me that her "breathing is off". She went to [facility] and she did fine for a while. She received amxocillin. She tells me that they were treating "an infection in [her] lungs". She reports that she has "fluid" on her lungs.  She did get steroids. She got the steroids  two months ago. Then three weeks ago, she did not get steroids.   She estimates that she got prednisone three times in the last year, maybe four.  This is a combination of steroids with and with urgent care.  She last saw Pulmonology in 2016. She saw Dr. Vassie Loll and she tells me that he did not do anything. Apparently at [facility] she was told that she has COPD rather than asthma.  Asthma/Respiratory Symptom History: She reports that the Symbicort is working  well. The Spiriva is working well also. She is out of her medications for the past three days and needs refills. She has never been on any injectable medications for her asthma aside from steroid injections.  Allergic Rhinitis Symptom History: She remains on the cetirizine and the Flonase daily.  She has not needed antibiotics. She has had an ear drum replacement and this has "burst". This was done by Dr. Ezzard Standing.   Otherwise, there have been no changes to her past medical history, surgical history, family history, or social history.    Review of Systems  Constitutional: Negative.  Negative for fever, malaise/fatigue and weight loss.  HENT: Negative.  Negative for congestion, ear discharge, ear pain, sinus pain and sore throat.   Eyes: Negative for pain, discharge and redness.  Respiratory: Negative for cough, sputum production, shortness of breath and wheezing.   Cardiovascular: Negative.  Negative for chest pain and palpitations.  Gastrointestinal: Negative for abdominal pain, constipation, diarrhea, heartburn, nausea and vomiting.  Skin: Negative.  Negative for itching and rash.  Neurological: Negative for dizziness and headaches.  Endo/Heme/Allergies: Negative for environmental allergies. Does not bruise/bleed easily.       Objective:   Blood pressure 138/80, pulse 92, temperature (!) 97.4 F (36.3 C), temperature source Temporal. There is no height or weight on file to calculate BMI.   Physical Exam:  Physical Exam Constitutional:      Appearance: She is well-developed.     Comments: Not as chipper as she usually is.  HENT:     Head: Normocephalic and atraumatic.     Right Ear: Tympanic membrane, ear canal and external ear normal.     Left Ear: Ear canal and external ear normal. Tympanic membrane is bulging.     Nose: No nasal deformity, septal deviation, mucosal edema, rhinorrhea or epistaxis.     Right Sinus: No maxillary sinus tenderness or frontal sinus tenderness.      Left Sinus: No maxillary sinus tenderness or frontal sinus tenderness.     Mouth/Throat:     Mouth: Oropharynx is clear and moist. Mucous membranes are not pale and not dry.     Pharynx: Uvula midline.  Eyes:     General:        Right eye: No discharge.        Left eye: No discharge.     Extraocular Movements: EOM normal.     Conjunctiva/sclera: Conjunctivae normal.     Right eye: Right conjunctiva is not injected. No chemosis.    Left eye: Left conjunctiva is not injected. No chemosis.    Pupils: Pupils are equal, round, and reactive to light.  Cardiovascular:     Rate and Rhythm: Normal rate and regular rhythm.     Heart sounds: Normal heart sounds.  Pulmonary:     Effort: Pulmonary effort is normal. No tachypnea, accessory muscle usage or respiratory distress.     Breath sounds: Normal breath sounds. No wheezing, rhonchi or rales.  Comments: Moving air well in all lung fields.  Somewhat decreased air movement at the bases. Chest:     Chest wall: No tenderness.  Lymphadenopathy:     Cervical: No cervical adenopathy.  Skin:    General: Skin is warm.     Capillary Refill: Capillary refill takes less than 2 seconds.     Coloration: Skin is not pale.     Findings: No abrasion, erythema, petechiae or rash. Rash is not papular, urticarial or vesicular.     Comments: No eczematous or urticarial lesions noted.  Neurological:     Mental Status: She is alert.  Psychiatric:        Mood and Affect: Mood and affect normal.      Diagnostic studies: Rapid COVID19 test performed and was negative      Malachi Bonds, MD  [facility]

## 2020-07-01 ENCOUNTER — Ambulatory Visit (INDEPENDENT_AMBULATORY_CARE_PROVIDER_SITE_OTHER): Payer: Self-pay | Admitting: Otolaryngology

## 2020-07-02 LAB — CBC WITH DIFFERENTIAL/PLATELET
Basophils Absolute: 0 10*3/uL (ref 0.0–0.2)
Basos: 0 %
EOS (ABSOLUTE): 0 10*3/uL (ref 0.0–0.4)
Eos: 1 %
Hematocrit: 38.3 % (ref 34.0–46.6)
Hemoglobin: 12.8 g/dL (ref 11.1–15.9)
Immature Grans (Abs): 0 10*3/uL (ref 0.0–0.1)
Immature Granulocytes: 0 %
Lymphocytes Absolute: 1.5 10*3/uL (ref 0.7–3.1)
Lymphs: 29 %
MCH: 30.8 pg (ref 26.6–33.0)
MCHC: 33.4 g/dL (ref 31.5–35.7)
MCV: 92 fL (ref 79–97)
Monocytes Absolute: 0.3 10*3/uL (ref 0.1–0.9)
Monocytes: 6 %
Neutrophils Absolute: 3.2 10*3/uL (ref 1.4–7.0)
Neutrophils: 64 %
Platelets: 298 10*3/uL (ref 150–450)
RBC: 4.16 x10E6/uL (ref 3.77–5.28)
RDW: 13.8 % (ref 11.7–15.4)
WBC: 5 10*3/uL (ref 3.4–10.8)

## 2020-07-02 LAB — ALPHA-1-ANTITRYPSIN: A-1 Antitrypsin: 140 mg/dL (ref 101–187)

## 2020-07-02 LAB — ASPERGILLUS PRECIPITINS
A.Fumigatus #1 Abs: NEGATIVE
Aspergillus Flavus Antibodies: NEGATIVE
Aspergillus Niger Antibodies: NEGATIVE
Aspergillus glaucus IgG: NEGATIVE
Aspergillus nidulans IgG: NEGATIVE
Aspergillus terreus IgG: NEGATIVE

## 2020-07-02 LAB — ANCA TITERS
Atypical pANCA: <1:20 {titer}
C-ANCA: <1:20 {titer}
P-ANCA: <1:20 {titer}

## 2020-07-02 LAB — IGE: IgE (Immunoglobulin E), Serum: 29 IU/mL (ref 6–495)

## 2020-08-26 ENCOUNTER — Ambulatory Visit: Payer: Medicare Other | Admitting: Family Medicine

## 2020-08-26 NOTE — Progress Notes (Deleted)
   [address] [place] [address] Dept: [phone]  FOLLOW UP NOTE  Patient ID: [name], female    DOB: 1946-02-06  Age: 75 y.o. MRN: [id number] Date of Office Visit: 08/26/2020  Assessment  Chief Complaint: No chief complaint on file.  HPI [name]    Drug Allergies:  No Known Allergies  Physical Exam: There were no vitals taken for this visit.   Physical Exam  Diagnostics:    Assessment and Plan: No diagnosis found.  No orders of the defined types were placed in this encounter.   There are no Patient Instructions on file for this visit.  No follow-ups on file.    Thank you for the opportunity to care for this patient.  Please do not hesitate to contact me with questions.  Thermon Leyland, FNP [facility]

## 2020-08-26 NOTE — Patient Instructions (Incomplete)
Asthma Continue Breztri 2 puffs twice a day with a spacer to prevent cough or wheeze Continue montelukast 10 mg once a day to prevent cough or wheeze Continue albuterol 2 puffs every 4 hours as needed for cough or wheeze OR Instead use albuterol 0.083% solution via nebulizer one unit vial every 4 hours as needed for cough or wheeze  Allergic rhinitis Continue cetirizine 10 mg once a day as needed for runny nose or itch Continue Flonase 1 to 2 sprays in each nostril once a day as needed for stuffy nose. In the right nostril, point the applicator out toward the right ear. In the left nostril, point the applicator out toward the left ear Consider saline nasal rinses as needed for nasal symptoms. Use this before any medicated nasal sprays for best result For thick postnasal drainage, begin Mucinex 600 to 1200 mg twice a day and increase fluid intake to thin out mucus  Reflux Continue dietary and lifestyle modifications as listed below  Call the clinic if this treatment plan is not working well for you  Follow up in *** or sooner if needed.

## 2020-11-09 ENCOUNTER — Ambulatory Visit (INDEPENDENT_AMBULATORY_CARE_PROVIDER_SITE_OTHER): Payer: Medicare Other | Admitting: Allergy and Immunology

## 2020-11-09 ENCOUNTER — Other Ambulatory Visit: Payer: Self-pay

## 2020-11-09 VITALS — BP 124/76 | HR 79 | Temp 97.3°F | Resp 16 | Ht 61.0 in | Wt 164.8 lb

## 2020-11-09 DIAGNOSIS — J4540 Moderate persistent asthma, uncomplicated: Secondary | ICD-10-CM | POA: Diagnosis not present

## 2020-11-09 DIAGNOSIS — J3089 Other allergic rhinitis: Secondary | ICD-10-CM

## 2020-11-09 DIAGNOSIS — K219 Gastro-esophageal reflux disease without esophagitis: Secondary | ICD-10-CM | POA: Diagnosis not present

## 2020-11-09 DIAGNOSIS — R079 Chest pain, unspecified: Secondary | ICD-10-CM

## 2020-11-09 MED ORDER — ALBUTEROL SULFATE (2.5 MG/3ML) 0.083% IN NEBU: 2.5000 mg | INHALATION_SOLUTION | RESPIRATORY_TRACT | 1 refills | Status: DC | PRN

## 2020-11-09 MED ORDER — FLUTICASONE PROPIONATE 50 MCG/ACT NA SUSP: 2.0000 | Freq: Every day | NASAL | 1 refills | Status: DC

## 2020-11-09 MED ORDER — MONTELUKAST SODIUM 10 MG PO TABS: 10.0000 mg | ORAL_TABLET | Freq: Every day | ORAL | 5 refills | Status: DC

## 2020-11-09 MED ORDER — ESOMEPRAZOLE MAGNESIUM 40 MG PO CPDR: 40.0000 mg | DELAYED_RELEASE_CAPSULE | Freq: Every day | ORAL | 5 refills | Status: DC

## 2020-11-09 MED ORDER — ALBUTEROL SULFATE HFA 108 (90 BASE) MCG/ACT IN AERS: INHALATION_SPRAY | RESPIRATORY_TRACT | 1 refills | Status: DC

## 2020-11-09 NOTE — Progress Notes (Signed)
- [place] [facility] [place] [facility] [place] - [place]   Follow-up Note  Referring Provider: Jackie Plum, MD Primary Provider: Jackie Plum, MD Date of Office Visit: 11/09/2020  Subjective:   [name] (DOB: [birth year]) is a 75 y.o. female who [facility] in re-evaluation of the following:  HPI: Mystery returns to this clinic in reevaluation of asthma and rhinitis and reflux.  I have not seen her in this clinic since 03 February 2020.  Apparently she was given a triple inhaler by Dr. Dellis Anes during her last contact with this clinic on 29 June 2020 which she felt helped her.  It seemed to work much better than her separate inhalers of Symbicort and Spiriva.  Unfortunately, she has had some problems having insurance cover the cost of this triple inhaler and she discontinued this agent because of lack of availability about 3 weeks ago and as expected she has been having coughing and wheezing over the course of the past 2 weeks or so and using her bronchodilator multiple times per week.  As well, she has been getting chest pain.  She describes as left-sided and right-sided exertional chest pain that resolves when she rests.  It sometimes radiates to the back of her "shoulder blade.  This has been going on for about 5 to 7 days.  She has had very little problems with her nose.  Her reflux is under very good control with Nexium.  She has had 3 Pfizer COVID vaccines.  Allergies as of 11/09/2020   No Known Allergies      Medication List     albuterol (2.5 MG/3ML) 0.083% nebulizer solution Commonly known as: PROVENTIL Take 3 mLs (2.5 mg total) by nebulization every 4 (four) hours as needed for wheezing or shortness of breath.   albuterol 108 (90 Base) MCG/ACT inhaler Commonly known as: VENTOLIN HFA INHALE 2 PUFFS BY MOUTH EVERY 4 HOURS AS NEEDED FOR WHEEZING OR SHORTNESS OF BREATH. PLEASE KEEP SCHEDULED OFFICE  VISIT FOR FURTHER REFILLS.   amLODipine 2.5 MG tablet Commonly known as: NORVASC Take 2.5 mg by mouth daily.   benztropine 1 MG tablet Commonly known as: COGENTIN Take 1 mg by mouth 2 (two) times daily.   fluticasone 50 MCG/ACT nasal spray Commonly known as: Flonase Place 2 sprays into both nostrils daily.   haloperidol 10 MG tablet Commonly known as: HALDOL Take by mouth.   hydrALAZINE 25 MG tablet Commonly known as: APRESOLINE Take 25 mg by mouth 2 (two) times daily.   Ingrezza 80 MG capsule Generic drug: valbenazine Take 1 capsule by mouth daily.   meloxicam 15 MG tablet Commonly known as: MOBIC Take 15 mg by mouth daily.   metoprolol succinate 25 MG 24 hr tablet Commonly known as: TOPROL-XL TAKE 1 TABLET BY MOUTH ONCE DAILY FOR 30 DAYS   montelukast 10 MG tablet Commonly known as: SINGULAIR Take 1 tablet (10 mg total) by mouth at bedtime.   nitroGLYCERIN 0.3 MG SL tablet Commonly known as: NITROSTAT Place 0.3 mg under the tongue every 5 (five) minutes as needed for chest pain.   omeprazole 20 MG capsule Commonly known as: PRILOSEC Take 20 mg by mouth daily.   sucralfate 1 g tablet Commonly known as: CARAFATE Take 1 g by mouth 4 (four) times daily.        Past Medical History:  Diagnosis Date   Allergy    Asthma    Chronic headaches  Endometriosis    Heart attack (HCC)    Hypertension    Mood swings     Past Surgical History:  Procedure Laterality Date   ABDOMINAL HYSTERECTOMY     EXPLORATORY LAPAROTOMY     I & D EXTREMITY Left 08/04/2017   Procedure: IRRIGATION AND DEBRIDEMENT HAND;  Surgeon: Betha Loa, MD;  Location: [facility] OR;  Service: Orthopedics;  Laterality: Left;   LEFT HEART CATHETERIZATION WITH CORONARY ANGIOGRAM N/A 04/10/2013   Procedure: LEFT HEART CATHETERIZATION WITH CORONARY ANGIOGRAM;  Surgeon: Ricki Rodriguez, MD;  Location: [facility] CATH LAB;  Service: Cardiovascular;  Laterality: N/A;    Review of systems negative except as  noted in HPI / PMHx or noted below:  Review of Systems  Constitutional: Negative.   HENT: Negative.    Eyes: Negative.   Respiratory: Negative.    Cardiovascular: Negative.   Gastrointestinal: Negative.   Genitourinary: Negative.   Musculoskeletal: Negative.   Skin: Negative.   Neurological: Negative.   Endo/Heme/Allergies: Negative.   Psychiatric/Behavioral: Negative.      Objective:   Vitals:   11/09/20 0956  BP: 124/76  Pulse: 79  Resp: 16  Temp: (!) 97.3 F (36.3 C)  SpO2: 95%   Height: 5\' 1"  (154.9 cm)  Weight: 164 lb 12.8 oz (74.8 kg)   Physical Exam Constitutional:      Appearance: She is not diaphoretic.  HENT:     Head: Normocephalic.     Right Ear: Tympanic membrane, ear canal and external ear normal.     Left Ear: Tympanic membrane, ear canal and external ear normal.     Nose: Nose normal. No mucosal edema or rhinorrhea.     Mouth/Throat:     Pharynx: Uvula midline. No oropharyngeal exudate.  Eyes:     Conjunctiva/sclera: Conjunctivae normal.  Neck:     Thyroid: No thyromegaly.     Trachea: Trachea normal. No tracheal tenderness or tracheal deviation.  Cardiovascular:     Rate and Rhythm: Normal rate and regular rhythm.     Heart sounds: Normal heart sounds, S1 normal and S2 normal. No murmur heard. Pulmonary:     Effort: No respiratory distress.     Breath sounds: Normal breath sounds. No stridor. No wheezing or rales.  Lymphadenopathy:     Head:     Right side of head: No tonsillar adenopathy.     Left side of head: No tonsillar adenopathy.     Cervical: No cervical adenopathy.  Skin:    Findings: No erythema or rash.     Nails: There is no clubbing.  Neurological:     Mental Status: She is alert.    Diagnostics:    Spirometry was performed and demonstrated an FEV1 of 1.42 at 95 % of predicted.  The patient had an Asthma Control Test with the following results: ACT Total Score: 13.    Results of blood tests obtained 28 June 2020  identified WBC 5.0, absolute eosinophils 0, absolute lymphocyte 1500, hemoglobin 12.8, platelet 298, alpha-1 antitrypsin 140 mg/DL, IgE 29 KU/L, negative Aspergillus antibodies, negative ANCA.  Results of a head CT scan obtained 12 June 2020 identifies the following:  Sinuses/Orbits: No acute finding.  Assessment and Plan:   1. Not well controlled moderate persistent asthma   2. Perennial allergic rhinitis   3. Gastroesophageal reflux disease, unspecified whether esophagitis present   4. Exertional chest pain     1.  Continue Breztri-2 inhalations 2 times a day with spacer  2.  Continue Flonase - 1 spray each nostril once a day  3. Continue Montelukast 10 mg - 1 tablet 1 time per day  4. Continue Nexium 40 mg - 1 tablet 1 time per day  5.  If needed:   A. Albuterol HFA - 2 inhalations every 4-6 hours  B. Cetirizine 10 mg - 1 tablet 1 time per day  6. Obtain fall flu vaccine  7. Visit with ER for exertional chest pain evaluation  8. Return to clinic in 6 months or earlier if problem  Glenora will restart a triple inhaler which works quite well for her regarding control of her asthma and she will continue on Flonase and montelukast for her upper airway disease and continue on Nexium for her reflux.  She appears to have exertional chest pain and I have asked her to visit the emergency room regarding evaluation of this issue as it appears to have presented itself over the course of the past week.  Assuming she does well with the plan noted above I will see her back in this clinic in 6 months or earlier if there is a problem.  Laurette Schimke, MD Allergy / Immunology [facility]

## 2020-11-09 NOTE — Patient Instructions (Addendum)
  1.  Continue Breztri-2 inhalations 2 times a day with spacer  2.  Continue Flonase - 1 spray each nostril once a day  3. Continue Montelukast 10 mg - 1 tablet 1 time per day  4. Continue Nexium 40 mg - 1 tablet 1 time per day  5.  If needed:   A. Albuterol HFA - 2 inhalations every 4-6 hours  B. Cetirizine 10 mg - 1 tablet 1 time per day  6. Obtain fall flu vaccine  7. Visit with ER for exertional chest pain evaluation  8. Return to clinic in 6 months or earlier if problem

## 2020-11-10 ENCOUNTER — Encounter: Payer: Self-pay | Admitting: Allergy and Immunology

## 2020-11-12 ENCOUNTER — Emergency Department (HOSPITAL_COMMUNITY): Payer: Medicare Other

## 2020-11-12 ENCOUNTER — Other Ambulatory Visit: Payer: Self-pay

## 2020-11-12 ENCOUNTER — Ambulatory Visit (HOSPITAL_COMMUNITY)
Admission: EM | Admit: 2020-11-12 | Discharge: 2020-11-12 | Disposition: A | Discharge status: Home / Self Care | Payer: Medicare Other | Attending: Internal Medicine | Admitting: Internal Medicine

## 2020-11-12 ENCOUNTER — Other Ambulatory Visit: Payer: Self-pay | Admitting: *Deleted

## 2020-11-12 ENCOUNTER — Emergency Department (HOSPITAL_COMMUNITY)
Admission: EM | Admit: 2020-11-12 | Discharge: 2020-11-12 | Disposition: A | Discharge status: Home / Self Care | Payer: Medicare Other | Attending: Emergency Medicine | Admitting: Emergency Medicine

## 2020-11-12 ENCOUNTER — Encounter (HOSPITAL_COMMUNITY): Payer: Self-pay

## 2020-11-12 ENCOUNTER — Encounter (HOSPITAL_COMMUNITY): Payer: Self-pay | Admitting: Emergency Medicine

## 2020-11-12 DIAGNOSIS — Z79899 Other long term (current) drug therapy: Secondary | ICD-10-CM | POA: Diagnosis not present

## 2020-11-12 DIAGNOSIS — Z7951 Long term (current) use of inhaled steroids: Secondary | ICD-10-CM | POA: Insufficient documentation

## 2020-11-12 DIAGNOSIS — R531 Weakness: Secondary | ICD-10-CM

## 2020-11-12 DIAGNOSIS — I219 Acute myocardial infarction, unspecified: Secondary | ICD-10-CM | POA: Insufficient documentation

## 2020-11-12 DIAGNOSIS — Z87891 Personal history of nicotine dependence: Secondary | ICD-10-CM | POA: Diagnosis not present

## 2020-11-12 DIAGNOSIS — R42 Dizziness and giddiness: Secondary | ICD-10-CM | POA: Insufficient documentation

## 2020-11-12 DIAGNOSIS — J4541 Moderate persistent asthma with (acute) exacerbation: Secondary | ICD-10-CM | POA: Diagnosis not present

## 2020-11-12 DIAGNOSIS — I10 Essential (primary) hypertension: Secondary | ICD-10-CM | POA: Diagnosis not present

## 2020-11-12 LAB — DIFFERENTIAL
Abs Immature Granulocytes: 0.03 10*3/uL (ref 0.00–0.07)
Basophils Absolute: 0 10*3/uL (ref 0.0–0.1)
Basophils Relative: 1 %
Eosinophils Absolute: 0.1 10*3/uL (ref 0.0–0.5)
Eosinophils Relative: 1 %
Immature Granulocytes: 1 %
Lymphocytes Relative: 28 %
Lymphs Abs: 1.8 10*3/uL (ref 0.7–4.0)
Monocytes Absolute: 0.3 10*3/uL (ref 0.1–1.0)
Monocytes Relative: 5 %
Neutro Abs: 4.1 10*3/uL (ref 1.7–7.7)
Neutrophils Relative %: 64 %

## 2020-11-12 LAB — CBC
HCT: 33.7 % — ABNORMAL LOW (ref 36.0–46.0)
Hemoglobin: 12.1 g/dL (ref 12.0–15.0)
MCH: 31.8 pg (ref 26.0–34.0)
MCHC: 35.9 g/dL (ref 30.0–36.0)
MCV: 88.7 fL (ref 80.0–100.0)
Platelets: 249 10*3/uL (ref 150–400)
RBC: 3.8 MIL/uL — ABNORMAL LOW (ref 3.87–5.11)
RDW: 13.6 % (ref 11.5–15.5)
WBC: 6.2 10*3/uL (ref 4.0–10.5)
nRBC: 0 % (ref 0.0–0.2)

## 2020-11-12 LAB — COMPREHENSIVE METABOLIC PANEL
ALT: 13 U/L (ref 0–44)
AST: 13 U/L — ABNORMAL LOW (ref 15–41)
Albumin: 3.4 g/dL — ABNORMAL LOW (ref 3.5–5.0)
Alkaline Phosphatase: 91 U/L (ref 38–126)
Anion gap: 6 (ref 5–15)
BUN: 7 mg/dL — ABNORMAL LOW (ref 8–23)
CO2: 25 mmol/L (ref 22–32)
Calcium: 8.8 mg/dL — ABNORMAL LOW (ref 8.9–10.3)
Chloride: 107 mmol/L (ref 98–111)
Creatinine, Ser: 0.9 mg/dL (ref 0.44–1.00)
GFR, Estimated: >60 mL/min (ref >60)
Glucose, Bld: 111 mg/dL — ABNORMAL HIGH (ref 70–99)
Potassium: 3.5 mmol/L (ref 3.5–5.1)
Sodium: 138 mmol/L (ref 135–145)
Total Bilirubin: 0.8 mg/dL (ref 0.3–1.2)
Total Protein: 6.3 g/dL — ABNORMAL LOW (ref 6.5–8.1)

## 2020-11-12 LAB — PROTIME-INR
INR: 1.1 (ref 0.8–1.2)
Prothrombin Time: 13.7 seconds (ref 11.4–15.2)

## 2020-11-12 LAB — I-STAT CHEM 8, ED
BUN: 6 mg/dL — ABNORMAL LOW (ref 8–23)
Calcium, Ion: 1.18 mmol/L (ref 1.15–1.40)
Chloride: 105 mmol/L (ref 98–111)
Creatinine, Ser: 0.8 mg/dL (ref 0.44–1.00)
Glucose, Bld: 108 mg/dL — ABNORMAL HIGH (ref 70–99)
HCT: 35 % — ABNORMAL LOW (ref 36.0–46.0)
Hemoglobin: 11.9 g/dL — ABNORMAL LOW (ref 12.0–15.0)
Potassium: 3.5 mmol/L (ref 3.5–5.1)
Sodium: 141 mmol/L (ref 135–145)
TCO2: 24 mmol/L (ref 22–32)

## 2020-11-12 LAB — APTT: aPTT: 27 seconds (ref 24–36)

## 2020-11-12 MED ORDER — MECLIZINE HCL 12.5 MG PO TABS: 12.5000 mg | ORAL_TABLET | Freq: Three times a day (TID) | ORAL | 0 refills | Status: DC | PRN

## 2020-11-12 MED ORDER — ALBUTEROL SULFATE (2.5 MG/3ML) 0.083% IN NEBU: INHALATION_SOLUTION | RESPIRATORY_TRACT | 1 refills | Status: DC

## 2020-11-12 MED ORDER — SODIUM CHLORIDE 0.9% FLUSH: 3.0000 mL | Freq: Once | INTRAVENOUS | Status: DC

## 2020-11-12 MED ORDER — MECLIZINE HCL 25 MG PO TABS
25.0000 mg | ORAL_TABLET | Freq: Once | ORAL | Status: AC
  Administered 2020-11-12: 25 mg via ORAL

## 2020-11-12 MED ORDER — MECLIZINE HCL 25 MG PO TABS
25.0000 mg | ORAL_TABLET | Freq: Once | ORAL | Status: DC
  Filled 2020-11-12: qty 1

## 2020-11-12 NOTE — ED Triage Notes (Addendum)
Pt reports pain/burning across center of chest, dizziness starting this morning about 0530, bilateral arm pain for 2 weeks. Endorses a little shob. States arm pain worse when lying down.

## 2020-11-12 NOTE — Discharge Instructions (Addendum)
Return for any problem.  ?

## 2020-11-12 NOTE — ED Notes (Signed)
Patient is being discharged from the Urgent Care and sent to the Emergency Department via wheelchair and RN  . Per [facility], patient is in need of higher level of care due to possible TIA. Patient is aware and verbalizes understanding of plan of care.  Vitals:   11/12/20 0817  BP: 130/65  Pulse: 88  Resp: 18  Temp: 99.1 F (37.3 C)  SpO2: 99%

## 2020-11-12 NOTE — Discharge Instructions (Addendum)
Go to the ED now 

## 2020-11-12 NOTE — ED Provider Notes (Signed)
[facility] EMERGENCY DEPARTMENT Provider Note   CSN: [id number] Arrival date & time: 11/12/20  0845     History Chief Complaint  Patient presents with   Dizziness    [name] is a 75 y.o. female.  75 year old female with prior medical history as detailed below presents for evaluation.  Patient complains of feeling dizzy.  Symptoms began around 5 AM when she woke up.  She reports that when she went to bed around 11 PM last night she did not have symptoms.  She reports that when she stands she feels unsteady on her feet.  She was feels that she needs to hold onto things in order to move around without falling.  She reports that she was able to drive the bus to urgent care for evaluation this morning.  Burning.  Sent to the ED for further evaluation.  She denies visual change, speech change, focal weakness, chest pain, shortness of breath, fever, or other acute complaint.  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  The history is provided by the patient and medical records.  Dizziness Quality:  Imbalance and room spinning Severity:  Mild Onset quality:  Sudden Duration:  9 hours Timing:  Constant Progression:  Unchanged Chronicity:  New Relieved by:  Nothing Worsened by:  Nothing     Past Medical History:  Diagnosis Date   Allergy    Asthma    Chronic headaches    Endometriosis    Heart attack (HCC)    Hypertension    Mood swings     Patient Active Problem List   Diagnosis Date Noted   Moderate persistent asthma with acute exacerbation 12/05/2017   Perennial allergic rhinitis 09/20/2017   MDD (major depressive disorder) 07/31/2017   Insomnia 02/23/2015   OSA (obstructive sleep apnea) 02/23/2015   Asthma 02/23/2015   Chest pain on exertion 04/10/2013   Menopausal hot flushes 01/22/2013   Frequency of urination 01/22/2013    Past Surgical History:  Procedure Laterality Date   ABDOMINAL HYSTERECTOMY     EXPLORATORY LAPAROTOMY     I & D EXTREMITY  Left 08/04/2017   Procedure: IRRIGATION AND DEBRIDEMENT HAND;  Surgeon: Betha Loa, MD;  Location: [facility] OR;  Service: Orthopedics;  Laterality: Left;   LEFT HEART CATHETERIZATION WITH CORONARY ANGIOGRAM N/A 04/10/2013   Procedure: LEFT HEART CATHETERIZATION WITH CORONARY ANGIOGRAM;  Surgeon: Ricki Rodriguez, MD;  Location: [facility] CATH LAB;  Service: Cardiovascular;  Laterality: N/A;     OB History   No obstetric history on file.     Family History  Problem Relation Age of Onset   Cancer Mother    Heart disease Mother    Cancer Father    Heart disease Father     Social History   Tobacco Use   Smoking status: Former    Pack years: 0.00    Types: Cigarettes   Smokeless tobacco: Never  Vaping Use   Vaping Use: Never used  Substance Use Topics   Alcohol use: Yes    Comment: occasional   Drug use: No    Home Medications Prior to Admission medications   Medication Sig Start Date End Date Taking? Authorizing Provider  albuterol (PROVENTIL) (2.5 MG/3ML) 0.083% nebulizer solution Use one vial in the nebulizer every 4-6 hours if needed for coughing, shortness of breath, or wheezing. 11/12/20   Kozlow, Alvira Philips, MD  albuterol (VENTOLIN HFA) 108 (90 Base) MCG/ACT inhaler INHALE 2 PUFFS BY MOUTH EVERY 4 HOURS AS NEEDED  FOR WHEEZING OR SHORTNESS OF BREATH. PLEASE KEEP SCHEDULED OFFICE VISIT FOR FURTHER REFILLS. 11/09/20   Kozlow, Alvira Philips, MD  amLODipine (NORVASC) 2.5 MG tablet Take 2.5 mg by mouth daily. Patient not taking: No sig reported 12/10/17   [provider]  benztropine (COGENTIN) 1 MG tablet Take 1 mg by mouth 2 (two) times daily. 05/07/20   [provider]  budesonide-formoterol (SYMBICORT) 160-4.5 MCG/ACT inhaler Inhale 2 puffs into the lungs 2 (two) times daily. Patient not taking: Reported on 11/09/2020 02/04/20   Jessica Priest, MD  esomeprazole (NEXIUM) 40 MG capsule Take 1 capsule (40 mg total) by mouth daily at 12 noon. 11/09/20   Kozlow, Alvira Philips, MD  fluticasone (FLONASE)  50 MCG/ACT nasal spray Place 2 sprays into both nostrils daily. 11/09/20   Kozlow, Alvira Philips, MD  haloperidol (HALDOL) 10 MG tablet Take by mouth. 10/13/20   [provider]  hydrALAZINE (APRESOLINE) 25 MG tablet Take 25 mg by mouth 2 (two) times daily. 01/03/18   [provider]  INGREZZA 80 MG CAPS Take 1 capsule by mouth daily. 01/16/20   [provider]  meloxicam (MOBIC) 15 MG tablet Take 15 mg by mouth daily. Patient not taking: Reported on 11/09/2020 04/24/19   [provider]  metoprolol succinate (TOPROL-XL) 25 MG 24 hr tablet TAKE 1 TABLET BY MOUTH ONCE DAILY FOR 30 DAYS 09/28/17   [provider]  montelukast (SINGULAIR) 10 MG tablet Take 1 tablet (10 mg total) by mouth at bedtime. 11/09/20   Kozlow, Alvira Philips, MD  nitroGLYCERIN (NITROSTAT) 0.3 MG SL tablet Place 0.3 mg under the tongue every 5 (five) minutes as needed for chest pain. 12/17/19   [provider]  sucralfate (CARAFATE) 1 g tablet Take 1 g by mouth 4 (four) times daily. 03/11/20   [provider]  Tiotropium Bromide Monohydrate (SPIRIVA RESPIMAT) 1.25 MCG/ACT AERS Inhale 2 puffs into the lungs daily. Patient not taking: Reported on 11/09/2020 02/04/20   Jessica Priest, MD  umeclidinium bromide (INCRUSE ELLIPTA) 62.5 MCG/INH AEPB Inhale 1 puff into the lungs daily. Patient not taking: No sig reported 02/10/20   Kozlow, Alvira Philips, MD    Allergies    Patient has no known allergies.  Review of Systems   Review of Systems  Neurological:  Positive for dizziness.  All other systems reviewed and are negative.  Physical Exam Updated Vital Signs BP (!) 144/101 (BP Location: Left Arm)   Pulse 78   Temp 98.2 F (36.8 C)   Resp 16   SpO2 100%   Physical Exam Vitals and nursing note reviewed.  Constitutional:      General: She is not in acute distress.    Appearance: Normal appearance. She is well-developed.  HENT:     Head: Normocephalic and atraumatic.  Eyes:      Conjunctiva/sclera: Conjunctivae normal.     Pupils: Pupils are equal, round, and reactive to light.  Cardiovascular:     Rate and Rhythm: Normal rate and regular rhythm.     Heart sounds: Normal heart sounds.  Pulmonary:     Effort: Pulmonary effort is normal. No respiratory distress.     Breath sounds: Normal breath sounds.  Abdominal:     General: There is no distension.     Palpations: Abdomen is soft.     Tenderness: There is no abdominal tenderness.  Musculoskeletal:        General: No deformity. Normal range of motion.  Cervical back: Normal range of motion and neck supple.  Skin:    General: Skin is warm and dry.  Neurological:     General: No focal deficit present.     Mental Status: She is alert and oriented to person, place, and time. Mental status is at baseline.     Cranial Nerves: No cranial nerve deficit.     Sensory: No sensory deficit.     Motor: No weakness.     Comments: Mildly ataxic with standing  Normal speech  No facial droop   No RUE weakness - LUE with chronic weakness  5/5 Strength to BLE     ED Results / Procedures / Treatments   Labs (all labs ordered are listed, but only abnormal results are displayed) Labs Reviewed  CBC - Abnormal; Notable for the following components:      Result Value   RBC 3.80 (*)    HCT 33.7 (*)    All other components within normal limits  COMPREHENSIVE METABOLIC PANEL - Abnormal; Notable for the following components:   Glucose, Bld 111 (*)    BUN 7 (*)    Calcium 8.8 (*)    Total Protein 6.3 (*)    Albumin 3.4 (*)    AST 13 (*)    All other components within normal limits  I-STAT CHEM 8, ED - Abnormal; Notable for the following components:   BUN 6 (*)    Glucose, Bld 108 (*)    Hemoglobin 11.9 (*)    HCT 35.0 (*)    All other components within normal limits  RESP PANEL BY RT-PCR (FLU A&B, COVID) ARPGX2  PROTIME-INR  APTT  DIFFERENTIAL  CBG MONITORING, ED    EKG EKG  Interpretation  Date/Time:  Friday November 12 2020 09:40:17 EDT Ventricular Rate:  64 PR Interval:  156 QRS Duration: 83 QT Interval:  416 QTC Calculation: 430 R Axis:   68 Text Interpretation: Sinus rhythm Confirmed by Kristine Royal [phone]) on 11/12/2020 9:41:20 AM  Radiology CT HEAD WO CONTRAST  Result Date: 11/12/2020 CLINICAL DATA:  Dizziness, nonspecific. EXAM: CT HEAD WITHOUT CONTRAST TECHNIQUE: Contiguous axial images were obtained from the base of the skull through the vertex without intravenous contrast. COMPARISON:  Prior head CT examinations 06/12/2020 and earlier. FINDINGS: Brain: Mild generalized cerebral atrophy. A small age-indeterminate lacunar infarct is questioned within the ventral left thalamus (series 3, image 14). There is no acute intracranial hemorrhage. No demarcated cortical infarct. No extra-axial fluid collection. No evidence of an intracranial mass. No midline shift. Vascular: No hyperdense vessel. Skull: Normal. Negative for fracture or focal lesion. Sinuses/Orbits: Visualized orbits show no acute finding. No significant paranasal sinus disease at the imaged levels. Other: Prominent irregularity of the left mandibular condyle, progressed as compared to the head CT of 06/12/2020. IMPRESSION: No acute intracranial hemorrhage or demarcated cortical infarction. A small age-indeterminate lacunar infarct is questioned within the ventral left thalamus. A brain MRI may be obtained for further evaluation, as clinically warranted. Mild generalized cerebral atrophy, stable. Prominent irregularity of the left mandibular condyle, progressed from the head CT of 06/12/2020. 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 Clinical correlation is recommended. Additionally, a nonemergent MRI of the temporomandibular joints may be obtained for further evaluation. Electronically Signed   By: Jackey Loge DO   On: 11/12/2020 09:36    Procedures Procedures   Medications  Ordered in ED Medications  sodium chloride flush (NS) 0.9 % injection 3 mL (has no administration in time  range)  meclizine (ANTIVERT) tablet 25 mg (has no administration in time range)    ED Course  I have reviewed the triage vital signs and the nursing notes.  Pertinent labs & imaging results that were available during my care of the patient were reviewed by me and considered in my medical decision making (see chart for details).    MDM Rules/Calculators/A&P                          MDM  MSE Complete  AUDRYANNA MAZIQUE was evaluated in Emergency Department on 11/12/2020 for the symptoms described in the history of present illness. She was evaluated in the context of the global COVID-19 pandemic, which necessitated consideration that the patient might be at risk for infection with the SARS-CoV-2 virus that causes COVID-19. Institutional protocols and algorithms that pertain to the evaluation of patients at risk for COVID-19 are in a state of rapid change based on information released by regulatory bodies including the CDC and federal and state organizations. These policies and algorithms were followed during the patient's care in the ED.   Patient is presenting with complaint of feeling dizzy.  Symptoms began when patient woke this morning.  Patient without significant acute pathology found on work-up.  Specifically, neuroimaging is without evidence of acute stroke.  Patient was administered Antivert during ED stay.  At time of discharge she now feels improved.  She denies any dizziness.  She desires discharge home.  She does understand need for close follow-up.  Strict return precautions given and understood.  She will be provided with a small prescription of Antivert for home use as needed.    Final Clinical Impression(s) / ED Diagnoses Final diagnoses:  Dizziness    Rx / DC Orders ED Discharge Orders          Ordered    meclizine (ANTIVERT) 12.5 MG tablet  3 times daily  PRN        11/12/20 1212             Wynetta Fines, MD 11/12/20 1213

## 2020-11-12 NOTE — ED Triage Notes (Signed)
Patient complains of dizziness that was present upon waking this morning at 0500, states she had no symptoms last night at 2300. Patient has no facial droop, speech is clear, no arm drift, sensation equal bilaterally. Patient is alert, oriented, and in no apparent distress at this time.

## 2020-11-12 NOTE — ED Provider Notes (Signed)
[facility]    CSN: [id number] Arrival date & time: 11/12/20  0801      History   Chief Complaint Chief Complaint  Patient presents with   chest burning   Dizziness   Arm Pain   Shortness of Breath    HPI [name] is a 75 y.o. female.  Pt reports she woke at 5am.  Pt reports she has been feeling dizzy since she got up.   Pt has had a history of a cva in the past.  Pt reports both arms feel weak.  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 No language interpreter was used.  Dizziness Quality:  Lightheadedness and head spinning Severity:  Moderate Onset quality:  Gradual Timing:  Constant Progression:  Worsening Chronicity:  New Relieved by:  Nothing Worsened by:  Nothing Ineffective treatments:  None tried Associated symptoms: shortness of breath   Arm Pain This is a recurrent problem. The problem occurs constantly. The problem has not changed since onset.Associated symptoms include shortness of breath. Nothing aggravates the symptoms. Nothing relieves the symptoms. She has tried nothing for the symptoms.  Shortness of Breath  Past Medical History:  Diagnosis Date   Allergy    Asthma    Chronic headaches    Endometriosis    Heart attack (HCC)    Hypertension    Mood swings     Patient Active Problem List   Diagnosis Date Noted   Moderate persistent asthma with acute exacerbation 12/05/2017   Perennial allergic rhinitis 09/20/2017   MDD (major depressive disorder) 07/31/2017   Insomnia 02/23/2015   OSA (obstructive sleep apnea) 02/23/2015   Asthma 02/23/2015   Chest pain on exertion 04/10/2013   Menopausal hot flushes 01/22/2013   Frequency of urination 01/22/2013    Past Surgical History:  Procedure Laterality Date   ABDOMINAL HYSTERECTOMY     EXPLORATORY LAPAROTOMY     I & D EXTREMITY Left 08/04/2017   Procedure: IRRIGATION AND DEBRIDEMENT HAND;  Surgeon: Betha Loa, MD;  Location: [facility];  Service:  Orthopedics;  Laterality: Left;   LEFT HEART CATHETERIZATION WITH CORONARY ANGIOGRAM N/A 04/10/2013   Procedure: LEFT HEART CATHETERIZATION WITH CORONARY ANGIOGRAM;  Surgeon: Ricki Rodriguez, MD;  Location: [facility];  Service: Cardiovascular;  Laterality: N/A;    OB History   No obstetric history on file.      Home Medications    Prior to Admission medications   Medication Sig Start Date End Date Taking? Authorizing Provider  albuterol (PROVENTIL) (2.5 MG/3ML) 0.083% nebulizer solution Take 3 mLs (2.5 mg total) by nebulization every 4 (four) hours as needed for wheezing or shortness of breath. 11/09/20  Yes Kozlow, Alvira Philips, MD  albuterol (VENTOLIN HFA) 108 (90 Base) MCG/ACT inhaler INHALE 2 PUFFS BY MOUTH EVERY 4 HOURS AS NEEDED FOR WHEEZING OR SHORTNESS OF BREATH. PLEASE KEEP SCHEDULED OFFICE VISIT FOR FURTHER REFILLS. 11/09/20  Yes Kozlow, Alvira Philips, MD  hydrALAZINE (APRESOLINE) 25 MG tablet Take 25 mg by mouth 2 (two) times daily. 01/03/18  Yes [provider]  metoprolol succinate (TOPROL-XL) 25 MG 24 hr tablet TAKE 1 TABLET BY MOUTH ONCE DAILY FOR 30 DAYS 09/28/17  Yes [provider]  montelukast (SINGULAIR) 10 MG tablet Take 1 tablet (10 mg total) by mouth at bedtime. 11/09/20  Yes Kozlow, Alvira Philips, MD  nitroGLYCERIN (NITROSTAT) 0.3 MG SL tablet Place 0.3 mg under the tongue every 5 (five) minutes as  needed for chest pain. 12/17/19  Yes [provider]  amLODipine (NORVASC) 2.5 MG tablet Take 2.5 mg by mouth daily. Patient not taking: No sig reported 12/10/17   [provider]  benztropine (COGENTIN) 1 MG tablet Take 1 mg by mouth 2 (two) times daily. 05/07/20   [provider]  budesonide-formoterol (SYMBICORT) 160-4.5 MCG/ACT inhaler Inhale 2 puffs into the lungs 2 (two) times daily. Patient not taking: Reported on 11/09/2020 02/04/20   Jessica Priest, MD  esomeprazole (NEXIUM) 40 MG capsule Take 1 capsule (40 mg total) by mouth daily at 12 noon. 11/09/20    Kozlow, Alvira Philips, MD  fluticasone (FLONASE) 50 MCG/ACT nasal spray Place 2 sprays into both nostrils daily. 11/09/20   Kozlow, Alvira Philips, MD  haloperidol (HALDOL) 10 MG tablet Take by mouth. 10/13/20   [provider]  INGREZZA 80 MG CAPS Take 1 capsule by mouth daily. 01/16/20   [provider]  meloxicam (MOBIC) 15 MG tablet Take 15 mg by mouth daily. Patient not taking: Reported on 11/09/2020 04/24/19   [provider]  sucralfate (CARAFATE) 1 g tablet Take 1 g by mouth 4 (four) times daily. 03/11/20   [provider]  Tiotropium Bromide Monohydrate (SPIRIVA RESPIMAT) 1.25 MCG/ACT AERS Inhale 2 puffs into the lungs daily. Patient not taking: Reported on 11/09/2020 02/04/20   Jessica Priest, MD  umeclidinium bromide (INCRUSE ELLIPTA) 62.5 MCG/INH AEPB Inhale 1 puff into the lungs daily. Patient not taking: No sig reported 02/10/20   Kozlow, Alvira Philips, MD    Family History Family History  Problem Relation Age of Onset   Cancer Mother    Heart disease Mother    Cancer Father    Heart disease Father     Social History Social History   Tobacco Use   Smoking status: Former    Pack years: 0.00    Types: Cigarettes   Smokeless tobacco: Never  Vaping Use   Vaping Use: Never used  Substance Use Topics   Alcohol use: Yes    Comment: occasional   Drug use: No     Allergies   Patient has no known allergies.   Review of Systems Review of Systems  Respiratory:  Positive for shortness of breath.   Neurological:  Positive for dizziness.  All other systems reviewed and are negative.   Physical Exam Triage Vital Signs ED Triage Vitals  Enc Vitals Group     BP 11/12/20 0817 130/65     Pulse Rate 11/12/20 0817 88     Resp 11/12/20 0817 18     Temp 11/12/20 0817 99.1 F (37.3 C)     Temp src --      SpO2 11/12/20 0817 99 %     Weight --      Height --      Head Circumference --      Peak Flow --      Pain Score 11/12/20 0810 5     Pain Loc --      Pain  Edu? --      Excl. in GC? --    No data found.  Updated Vital Signs BP 130/65   Pulse 88   Temp 99.1 F (37.3 C)   Resp 18   SpO2 99%   Visual Acuity Right Eye Distance:   Left Eye Distance:   Bilateral Distance:    Right Eye Near:   Left Eye Near:    Bilateral Near:  Physical Exam Vitals reviewed.  Constitutional:      Appearance: She is well-developed.  HENT:     Head: Normocephalic.  Eyes:     Pupils: Pupils are equal, round, and reactive to light.  Cardiovascular:     Rate and Rhythm: Normal rate.  Pulmonary:     Effort: Pulmonary effort is normal.     Breath sounds: No decreased breath sounds.  Chest:     Chest wall: No mass.  Musculoskeletal:        General: Normal range of motion.     Cervical back: Normal range of motion.  Skin:    General: Skin is warm.  Neurological:     General: No focal deficit present.     Mental Status: She is alert.  Psychiatric:        Mood and Affect: Mood normal.     UC Treatments / Results  Labs (all labs ordered are listed, but only abnormal results are displayed) Labs Reviewed - No data to display  EKG   Radiology No results found.  Procedures Procedures (including critical care time)  Medications Ordered in UC Medications - No data to display  Initial Impression / Assessment and Plan / UC Course  I have reviewed the triage vital signs and the nursing notes.  Pertinent labs & imaging results that were available during my care of the patient were reviewed by me and considered in my medical decision making (see chart for details).     MDM:  Pt is concerned she may have had a stroke.  Pt sent to ED for evalatuion  Final Clinical Impressions(s) / UC Diagnoses   Final diagnoses:  Dizziness  Weakness   Discharge Instructions   None    ED Prescriptions   None    PDMP not reviewed this encounter.   Elson Areas, [place] 11/12/20 [phone]

## 2021-04-11 ENCOUNTER — Encounter: Payer: Self-pay | Admitting: *Deleted

## 2021-04-11 NOTE — Congregational Nurse Program (Signed)
  Dept: [phone]   Congregational Nurse Program Note  Date of Encounter: 04/11/2021  Past Medical History: Past Medical History:  Diagnosis Date   Allergy    Asthma    Chronic headaches    Endometriosis    Heart attack (HCC)    Hypertension    Mood swings     Encounter Details:  CNP Questionnaire - 04/11/21 0929       Questionnaire   Do you give verbal consent to treat you today? Yes    Location Patient Served  [facility]    Visit Setting Church or Organization    Patient Status Homeless    Insurance [place];Medicare    Insurance Referral N/A    Medication N/A    Medical Provider Yes    Screening Referrals N/A    Medical Referral Other   see CN note   Medical Appointment Made N/A    Food N/A    Transportation N/A    Housing/Utilities No permanent housing;Referred to housing/utility assistance program    Interpersonal Safety N/A    Intervention Counsel;Educate;Case Management;Support    ED Visit Averted N/A    Life-Saving Intervention Made N/A            Client seen at [facility] day shelter. Client has BLE swelling with tightness. Per assessment, client has not been taking furosemide as prescribed. She has a hx of heart conditions and has been sleeping on a park bench. Educated client about her medications and importance of taking as prescribed. Discussed ways to elevate her legs. Completed [facility] Referral Form for temporary housing during cold weather. Client acknowledges understanding. Safety maintained.  Naylani Bradner W RN CN [phone]

## 2021-05-17 ENCOUNTER — Ambulatory Visit: Payer: Medicare Other | Admitting: Allergy and Immunology

## 2021-05-17 NOTE — Progress Notes (Deleted)
[place] - [place] - [place] - [place] - [place]   Follow-up Note  Referring Provider: Jackie Plum, MD Primary Provider: Jackie Plum, MD Date of Office Visit: 05/17/2021  Subjective:   [name] (DOB: 15-Sep-1945) is a 76 y.o. female who [facility] in re-evaluation of the following:  HPI:   Allergies as of 05/17/2021   No Known Allergies      Medication List        Accurate as of May 17, 2021  9:41 AM. If you have any questions, ask your nurse or doctor.          albuterol 108 (90 Base) MCG/ACT inhaler Commonly known as: VENTOLIN HFA INHALE 2 PUFFS BY MOUTH EVERY 4 HOURS AS NEEDED FOR WHEEZING OR SHORTNESS OF BREATH. PLEASE KEEP SCHEDULED OFFICE VISIT FOR FURTHER REFILLS. What changed:  how much to take how to take this when to take this reasons to take this additional instructions   albuterol (2.5 MG/3ML) 0.083% nebulizer solution Commonly known as: PROVENTIL Use one vial in the nebulizer every 4-6 hours if needed for coughing, shortness of breath, or wheezing. What changed: Another medication with the same name was changed. Make sure you understand how and when to take each.   amLODipine 2.5 MG tablet Commonly known as: NORVASC Take 2.5 mg by mouth daily.   budesonide-formoterol 160-4.5 MCG/ACT inhaler Commonly known as: Symbicort Inhale 2 puffs into the lungs 2 (two) times daily.   esomeprazole 40 MG capsule Commonly known as: NexIUM Take 1 capsule (40 mg total) by mouth daily at 12 noon.   fluticasone 50 MCG/ACT nasal spray Commonly known as: Flonase Place 2 sprays into both nostrils daily.   Incruse Ellipta 62.5 MCG/ACT Aepb Generic drug: umeclidinium bromide Inhale 1 puff into the lungs daily.   meclizine 12.5 MG tablet Commonly known as: ANTIVERT Take 1 tablet (12.5 mg total) by mouth 3 (three) times daily as needed for dizziness.   meloxicam 15 MG  tablet Commonly known as: MOBIC Take 15 mg by mouth daily.   metoprolol succinate 25 MG 24 hr tablet Commonly known as: TOPROL-XL Take 25 mg by mouth daily.   montelukast 10 MG tablet Commonly known as: SINGULAIR Take 1 tablet (10 mg total) by mouth at bedtime.   nitroGLYCERIN 0.3 MG SL tablet Commonly known as: NITROSTAT Place 0.3 mg under the tongue every 5 (five) minutes as needed for chest pain.   Spiriva Respimat 1.25 MCG/ACT Aers Generic drug: Tiotropium Bromide Monohydrate Inhale 2 puffs into the lungs daily.        Past Medical History:  Diagnosis Date   Allergy    Asthma    Chronic headaches    Endometriosis    Heart attack (HCC)    Hypertension    Mood swings     Past Surgical History:  Procedure Laterality Date   ABDOMINAL HYSTERECTOMY     EXPLORATORY LAPAROTOMY     I & D EXTREMITY Left 08/04/2017   Procedure: IRRIGATION AND DEBRIDEMENT HAND;  Surgeon: Betha Loa, MD;  [facility]: [facility];  Service: Orthopedics;  Laterality: Left;   LEFT HEART CATHETERIZATION WITH CORONARY ANGIOGRAM N/A 04/10/2013   Procedure: LEFT HEART CATHETERIZATION WITH CORONARY ANGIOGRAM;  Surgeon: Ricki Rodriguez, MD;  [facility];  Service: Cardiovascular;  Laterality: N/A;    Review of systems negative except as noted in HPI / PMHx or noted below:  ROS   Objective:   There  were no vitals filed for this visit.        Physical Exam  Diagnostics:    Spirometry was performed and demonstrated an FEV1 of *** at *** % of predicted.  The patient had an Asthma Control Test with the following results:  .    Assessment and Plan:   No diagnosis found.  There are no Patient Instructions on file for this visit.  Laurette Schimke, MD Allergy / Immunology  [facility]

## 2021-07-13 ENCOUNTER — Emergency Department (HOSPITAL_COMMUNITY): Payer: Medicare Other

## 2021-07-13 ENCOUNTER — Other Ambulatory Visit: Payer: Self-pay

## 2021-07-13 ENCOUNTER — Encounter (HOSPITAL_COMMUNITY): Payer: Self-pay

## 2021-07-13 ENCOUNTER — Emergency Department (HOSPITAL_COMMUNITY)
Admission: EM | Admit: 2021-07-13 | Discharge: 2021-07-14 | Disposition: A | Discharge status: Home / Self Care | Payer: Medicare Other | Attending: Emergency Medicine | Admitting: Emergency Medicine

## 2021-07-13 DIAGNOSIS — Z7951 Long term (current) use of inhaled steroids: Secondary | ICD-10-CM | POA: Insufficient documentation

## 2021-07-13 DIAGNOSIS — R001 Bradycardia, unspecified: Secondary | ICD-10-CM | POA: Insufficient documentation

## 2021-07-13 DIAGNOSIS — R944 Abnormal results of kidney function studies: Secondary | ICD-10-CM | POA: Insufficient documentation

## 2021-07-13 DIAGNOSIS — Z79899 Other long term (current) drug therapy: Secondary | ICD-10-CM | POA: Diagnosis not present

## 2021-07-13 DIAGNOSIS — R0600 Dyspnea, unspecified: Secondary | ICD-10-CM | POA: Insufficient documentation

## 2021-07-13 DIAGNOSIS — I10 Essential (primary) hypertension: Secondary | ICD-10-CM | POA: Diagnosis not present

## 2021-07-13 DIAGNOSIS — M25511 Pain in right shoulder: Secondary | ICD-10-CM | POA: Diagnosis not present

## 2021-07-13 DIAGNOSIS — R059 Cough, unspecified: Secondary | ICD-10-CM | POA: Diagnosis not present

## 2021-07-13 DIAGNOSIS — D649 Anemia, unspecified: Secondary | ICD-10-CM | POA: Diagnosis not present

## 2021-07-13 DIAGNOSIS — R079 Chest pain, unspecified: Secondary | ICD-10-CM

## 2021-07-13 DIAGNOSIS — R770 Abnormality of albumin: Secondary | ICD-10-CM | POA: Insufficient documentation

## 2021-07-13 DIAGNOSIS — R0981 Nasal congestion: Secondary | ICD-10-CM | POA: Diagnosis not present

## 2021-07-13 DIAGNOSIS — M791 Myalgia, unspecified site: Secondary | ICD-10-CM | POA: Insufficient documentation

## 2021-07-13 DIAGNOSIS — Z20822 Contact with and (suspected) exposure to covid-19: Secondary | ICD-10-CM | POA: Insufficient documentation

## 2021-07-13 DIAGNOSIS — J45909 Unspecified asthma, uncomplicated: Secondary | ICD-10-CM | POA: Diagnosis not present

## 2021-07-13 DIAGNOSIS — R0789 Other chest pain: Secondary | ICD-10-CM | POA: Insufficient documentation

## 2021-07-13 LAB — CBC
HCT: 32.6 % — ABNORMAL LOW (ref 36.0–46.0)
Hemoglobin: 11.6 g/dL — ABNORMAL LOW (ref 12.0–15.0)
MCH: 30.9 pg (ref 26.0–34.0)
MCHC: 35.6 g/dL (ref 30.0–36.0)
MCV: 86.7 fL (ref 80.0–100.0)
Platelets: 312 10*3/uL (ref 150–400)
RBC: 3.76 MIL/uL — ABNORMAL LOW (ref 3.87–5.11)
RDW: 14.2 % (ref 11.5–15.5)
WBC: 6.2 10*3/uL (ref 4.0–10.5)
nRBC: 0 % (ref 0.0–0.2)

## 2021-07-13 LAB — BASIC METABOLIC PANEL
Anion gap: 9 (ref 5–15)
BUN: 11 mg/dL (ref 8–23)
CO2: 25 mmol/L (ref 22–32)
Calcium: 8.9 mg/dL (ref 8.9–10.3)
Chloride: 103 mmol/L (ref 98–111)
Creatinine, Ser: 0.97 mg/dL (ref 0.44–1.00)
GFR, Estimated: >60 mL/min (ref >60)
Glucose, Bld: 120 mg/dL — ABNORMAL HIGH (ref 70–99)
Potassium: 3.9 mmol/L (ref 3.5–5.1)
Sodium: 137 mmol/L (ref 135–145)

## 2021-07-13 LAB — RESP PANEL BY RT-PCR (FLU A&B, COVID) ARPGX2
Influenza A by PCR: NEGATIVE
Influenza B by PCR: NEGATIVE
SARS Coronavirus 2 by RT PCR: NEGATIVE

## 2021-07-13 LAB — TROPONIN I (HIGH SENSITIVITY): Troponin I (High Sensitivity): 8 ng/L (ref <18)

## 2021-07-13 MED ORDER — ACETAMINOPHEN 325 MG PO TABS
650.0000 mg | ORAL_TABLET | Freq: Once | ORAL | Status: AC
  Administered 2021-07-14: 03:00:00 650 mg via ORAL
  Filled 2021-07-13: qty 2

## 2021-07-13 MED ORDER — LIDOCAINE 5 % EX PTCH
2.0000 | MEDICATED_PATCH | CUTANEOUS | Status: DC
  Administered 2021-07-14: 03:00:00 2 via TRANSDERMAL
  Filled 2021-07-13: qty 2
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
## 2021-07-13 NOTE — ED Provider Notes (Addendum)
[facility] Provider Note   CSN: [id number] Arrival date & time: 07/13/21  2114     History  Chief Complaint  Patient presents with   Arm Pain   Chest Pain    TAQUASIA SAWICKI is a 76 y.o. female with a hx of hypertension, asthma, depression, insomnia, and OSA who presents to the ED with complaints of right shoulder pain for the past few months which has progressively worsened especially over the past few days. She states it is limiting her mobility. She reports feeling generally sore/achy and fatigued. Notes chest pain to the anterior chest wall that is worse with movement of her arms and worse in certain positions. No alleviating factors. Tried aspirin without relief. She mentions some nasal congestion, mild cough, and dyspnea. Denies fever, lower leg pain, syncope, dizziness, or unilateral weakness.   HPI     Home Medications Prior to Admission medications   Medication Sig Start Date End Date Taking? Authorizing Provider  albuterol (PROVENTIL) (2.5 MG/3ML) 0.083% nebulizer solution Use one vial in the nebulizer every 4-6 hours if needed for coughing, shortness of breath, or wheezing. 11/12/20   Kozlow, Donnamarie Poag, MD  albuterol (VENTOLIN HFA) 108 (90 Base) MCG/ACT inhaler INHALE 2 PUFFS BY MOUTH EVERY 4 HOURS AS NEEDED FOR WHEEZING OR SHORTNESS OF BREATH. PLEASE KEEP SCHEDULED OFFICE VISIT FOR FURTHER REFILLS. Patient taking differently: Inhale 2 puffs into the lungs every 4 (four) hours as needed for wheezing or shortness of breath. 11/09/20   Kozlow, Donnamarie Poag, MD  amLODipine (NORVASC) 2.5 MG tablet Take 2.5 mg by mouth daily. 12/10/17   [provider]  budesonide-formoterol (SYMBICORT) 160-4.5 MCG/ACT inhaler Inhale 2 puffs into the lungs 2 (two) times daily. Patient not taking: No sig reported 02/04/20   Kozlow, Donnamarie Poag, MD  esomeprazole (NEXIUM) 40 MG capsule Take 1 capsule (40 mg total) by mouth daily at 12 noon. 11/09/20   Kozlow, Donnamarie Poag, MD   fluticasone (FLONASE) 50 MCG/ACT nasal spray Place 2 sprays into both nostrils daily. 11/09/20   Kozlow, Donnamarie Poag, MD  meclizine (ANTIVERT) 12.5 MG tablet Take 1 tablet (12.5 mg total) by mouth 3 (three) times daily as needed for dizziness. 11/12/20   Valarie Merino, MD  meloxicam (MOBIC) 15 MG tablet Take 15 mg by mouth daily. 04/24/19   [provider]  metoprolol succinate (TOPROL-XL) 25 MG 24 hr tablet Take 25 mg by mouth daily. 09/28/17   [provider]  montelukast (SINGULAIR) 10 MG tablet Take 1 tablet (10 mg total) by mouth at bedtime. Patient not taking: No sig reported 11/09/20   Kozlow, Donnamarie Poag, MD  nitroGLYCERIN (NITROSTAT) 0.3 MG SL tablet Place 0.3 mg under the tongue every 5 (five) minutes as needed for chest pain. 12/17/19   [provider]  Tiotropium Bromide Monohydrate (SPIRIVA RESPIMAT) 1.25 MCG/ACT AERS Inhale 2 puffs into the lungs daily. Patient not taking: No sig reported 02/04/20   Kozlow, Donnamarie Poag, MD  umeclidinium bromide (INCRUSE ELLIPTA) 62.5 MCG/INH AEPB Inhale 1 puff into the lungs daily. Patient not taking: No sig reported 02/10/20   Kozlow, Donnamarie Poag, MD      Allergies    Patient has no known allergies.    Review of Systems   Review of Systems  Constitutional:  Negative for chills and fever.  HENT:  Positive for congestion.   Respiratory:  Positive for cough and shortness of breath.   Cardiovascular:  Positive for chest pain.  Gastrointestinal:  Negative for blood in stool and diarrhea.  Musculoskeletal:  Positive for arthralgias and myalgias.  Neurological:  Positive for numbness (per patient baseline peripheral neuropathy). Negative for syncope.  All other systems reviewed and are negative.  Physical Exam Updated Vital Signs BP 132/64 (BP Location: Right Arm)    Pulse (!) 123    Temp 98.3 F (36.8 C) (Oral)    Resp 18    SpO2 97%  Physical Exam Vitals and nursing note reviewed.  Constitutional:      General: She is not in acute  distress.    Appearance: Normal appearance. She is not toxic-appearing.  HENT:     Head: Normocephalic and atraumatic.  Eyes:     General:        Right eye: No discharge.        Left eye: No discharge.     Conjunctiva/sclera: Conjunctivae normal.  Neck:     Comments: No midline tenderness.  Cardiovascular:     Rate and Rhythm: Regular rhythm. Tachycardia present.     Pulses:          Radial pulses are 2+ on the right side and 2+ on the left side.       Dorsalis pedis pulses are 2+ on the right side and 2+ on the left side.  Pulmonary:     Effort: No respiratory distress.     Breath sounds: No wheezing, rhonchi or rales.  Chest:     Chest wall: Tenderness present.  Abdominal:     General: There is no distension.     Palpations: Abdomen is soft.     Tenderness: There is no abdominal tenderness.  Musculoskeletal:     Cervical back: Normal range of motion and neck supple.     Right lower leg: Edema present.     Left lower leg: Edema present.     Comments: Upper extremities: No obvious deformity, appreciable swelling, edema, erythema, ecchymosis, warmth, or open wounds. Intact ROM throughout the LUE. RUE shoulder ROM significantly limited- able to move some actively, can passively range the joint but does have some pain with this. Tender to the right glenohumeral joint diffuse and to the proximal 1/3rd of the humerus otherwise no focal tenderness to palpation to the upper extremities. Compartments are soft.  Lower extremities: Symmetric lower leg edema. Intact AROM throughout the hips, knees, and ankles. No focal bony tenderness. Compartments are soft.   Skin:    General: Skin is warm and dry.     Capillary Refill: Capillary refill takes less than 2 seconds.     Findings: No rash.  Neurological:     Mental Status: She is alert.     Comments: Alert. Clear speech. Sensation grossly intact to bilateral upper extremities. 5/5 symmetric grip strength and strength with plantar/dorsiflexion  bilaterally.  Able to perform OK sign, thumbs up, and cross 2nd/3rd digits bilaterally.   Psychiatric:        Mood and Affect: Mood normal.        Behavior: Behavior normal.    ED Results / Procedures / Treatments   Labs (all labs ordered are listed, but only abnormal results are displayed) Labs Reviewed  CBC - Abnormal; Notable for the following components:      Result Value   RBC 3.76 (*)    Hemoglobin 11.6 (*)    HCT 32.6 (*)    All other components within normal limits  BASIC METABOLIC PANEL  TROPONIN I (HIGH SENSITIVITY)  EKG EKG Interpretation  Date/Time:  Wednesday July 13 2021 21:17:12 EST Ventricular Rate:  114 PR Interval:  146 QRS Duration: 68 QT Interval:  336 QTC Calculation: 463 R Axis:   104 Text Interpretation: Sinus tachycardia Right atrial enlargement Rightward axis Borderline ECG When compared with ECG of 12-Nov-2020 09:40, Rate faster Confirmed by Calvert Cantor [phone]) on 07/14/2021 12:25:02 AM  Radiology DG Chest 2 View  Result Date: 07/13/2021 CLINICAL DATA:  Chest pain EXAM: CHEST - 2 VIEW COMPARISON:  11/12/2020 FINDINGS: Cardiac shadow is within normal limits. Aortic calcifications are again seen. The lungs are well aerated bilaterally. No focal infiltrate or effusion is noted. No acute bony abnormality is seen. IMPRESSION: No active cardiopulmonary disease. Electronically Signed   By: Inez Catalina M.D.   On: 07/13/2021 22:07   DG Shoulder Right  Result Date: 07/13/2021 CLINICAL DATA:  Shoulder pain, no known injury, initial encounter EXAM: RIGHT SHOULDER - 2+ VIEW COMPARISON:  11/12/2020 FINDINGS: Irregularity in the humeral head is noted although no discrete fracture is seen. On 1 oblique image the humeral head is somewhat subluxed inferiorly although no true dislocation is seen. No other focal abnormality is noted. IMPRESSION: Irregularity of the humeral head which is stable from prior exams. No definitive dislocation or fracture is noted. One view  shows some slight inferior subluxation but may be positional in nature. Electronically Signed   By: Inez Catalina M.D.   On: 07/13/2021 22:09    Procedures Procedures    Medications Ordered in ED Medications  lidocaine (LIDODERM) 5 % 2 patch (2 patches Transdermal Patch Applied 07/14/21 0318)  acetaminophen (TYLENOL) tablet 650 mg (650 mg Oral Given 07/14/21 0318)  ketorolac (TORADOL) 15 MG/ML injection 15 mg (15 mg Intravenous Given 07/14/21 0318)    ED Course/ Medical Decision Making/ A&P                           Medical Decision Making Amount and/or Complexity of Data Reviewed Labs: ordered.  Risk OTC drugs. Prescription drug management.   Patient presents to the ED with complaints of right shoulder pain primarily as well as some chest pain and generalized muscle pain, this involves an extensive number of treatment options, and is a complaint that carries with it a high risk of complications and morbidity. Nontoxic, vitals w/ tachycardia on arrival, improving while in the ED.   Additional history obtained:  Chart review & nursing note review.   EKG: no STEMI- Sinus tachycardia Right atrial enlargement Rightward axis Borderline ECG When compared with ECG of 12-Nov-2020 09:40, Rate faster   Lab Tests:  I reviewed & interpreted labs including:  CBC: Mild anemia similar to prior.  BMP: Unremarkable.  Hepatic function panel: Mild hypoalbuminemia.  Lipase: WNL CK: Minimal elevation.  Troponin: No significant elevation, flat Covid/Flu: Negative  Imaging Studies ordered:  I ordered and viewed the following imaging, agree with radiologist impression:  CXR; No active cardiopulmonary disease Right shoulder xray: Irregularity of the humeral head which is stable from prior exams. No definitive dislocation or fracture is noted. One view shows some slight inferior subluxation but may be positional in nature.   ED Course:  I ordered medications including tylenol, lidoderm patches &  toradol for pain.   Patient sleeping on re-assessment.   Chest pain- CXR reassuring, ekg without acute ischemia and troponins are flat- doubt ACS. Not hypoxic, PE seems less likely. Symmetric pulses, no widened mediastinum- doubt dissection.No significant LFT/lipase derangement,  abdomen without peritoneal signs. CXR w/o pneumonia. COVID/Flu negative.  Pain with palpation of chest wall- may be MSK related.   Right shoulder pain- Xray without definitve fracture/dislocation. Does have irregularly stable from prior exams. ROM limited actively, can passively range with pain at certain points. NVI distally. No signs of infection on exam. No edema to raise concern for DVT. Well perfused extremity.   Patient feeling much better on reassessment S/p above medications.  Given she is homeless and frequently staying at [facility] consult placed to [facility]. She states she feels ready to leave, seems reasonable for discharge at this time. Will provide supportive care. PCP follow up. I discussed results, treatment plan, need for follow-up, and return precautions with the patient. Provided opportunity for questions, patient confirmed understanding and is in agreement with plan.   Social Determinants:  - Homelessness- given time to rest and sleep in the ED overnight, resources provided, she states she has been staying @ a [facility] and plans to stay with a relative in the near future, she does not wish to wait in the ED to talk to social work currently. Consult was placed to [facility] for follow up.   Findings and plan of care discussed with supervising physician Dr. Karle Starch who evaluated patient as shared visit &  is in agreement.   Portions of this note were generated with [employer]. Dictation errors may occur despite best attempts at proofreading.   Final Clinical Impression(s) / ED Diagnoses Final diagnoses:  Right shoulder pain, unspecified chronicity  Chest pain, unspecified type    Rx / DC Orders ED  Discharge Orders     None         Leafy Kindle 07/14/21 0551    Truddie Hidden, MD 07/14/21 0553    Amaryllis Dyke, PA-C 07/14/21 0602    Truddie Hidden, MD 07/14/21 [phone]

## 2021-07-14 DIAGNOSIS — R0789 Other chest pain: Secondary | ICD-10-CM | POA: Diagnosis not present

## 2021-07-14 LAB — HEPATIC FUNCTION PANEL
ALT: 14 U/L (ref 0–44)
AST: 15 U/L (ref 15–41)
Albumin: 3 g/dL — ABNORMAL LOW (ref 3.5–5.0)
Alkaline Phosphatase: 75 U/L (ref 38–126)
Bilirubin, Direct: <0.1 mg/dL (ref 0.0–0.2)
Total Bilirubin: 0.2 mg/dL — ABNORMAL LOW (ref 0.3–1.2)
Total Protein: 6.2 g/dL — ABNORMAL LOW (ref 6.5–8.1)

## 2021-07-14 LAB — CK: Total CK: 311 U/L — ABNORMAL HIGH (ref 38–234)

## 2021-07-14 LAB — TROPONIN I (HIGH SENSITIVITY): Troponin I (High Sensitivity): 9 ng/L (ref <18)

## 2021-07-14 LAB — LIPASE, BLOOD: Lipase: 34 U/L (ref 11–51)

## 2021-07-14 MED ORDER — KETOROLAC TROMETHAMINE 15 MG/ML IJ SOLN
15.0000 mg | Freq: Once | INTRAMUSCULAR | Status: AC
  Administered 2021-07-14: 03:00:00 15 mg via INTRAVENOUS
  Filled 2021-07-14: qty 1

## 2021-07-14 MED ORDER — LIDOCAINE 5 % EX PTCH: 1.0000 | MEDICATED_PATCH | Freq: Every day | CUTANEOUS | 0 refills | Status: DC | PRN

## 2021-07-14 NOTE — ED Notes (Signed)
Pt ambulated with steady gait

## 2021-07-14 NOTE — Discharge Instructions (Addendum)
Please take tylenol as needed for pain per over the counter dosing.  ?Use 1 lidoderm patch to your area of most significant pain once per day, remove and discard patch within 12 hours of applications. DO not put heat over this area.  ? ?We have prescribed you new medication(s) today. Discuss the medications prescribed today with your pharmacist as they can have adverse effects and interactions with your other medicines including over the counter and prescribed medications. Seek medical evaluation if you start to experience new or abnormal symptoms after taking one of these medicines, seek care immediately if you start to experience difficulty breathing, feeling of your throat closing, facial swelling, or rash as these could be indications of a more serious allergic reaction ? ?Please use the sling for comfort but try to continue to move your shoulder as if it remains still this puts you at risk for frozen shoulder syndrome/adhesive capsulitis.  ? ?Follow up with primary care provider as soon as possible.. Additionaly given your shoulder pain we have provided orthopedic surgery information and given your chest pain we have provided cardiology follow up. Return to the ER for any new or worsening symptoms including but not limited to new or worsening pain, trouble breathing, passing out, trouble walking, falls, fever, numbness, weakness, or any other concerns.  ?

## 2021-08-17 ENCOUNTER — Ambulatory Visit (HOSPITAL_BASED_OUTPATIENT_CLINIC_OR_DEPARTMENT_OTHER): Payer: Medicare Other | Admitting: Orthopaedic Surgery

## 2021-08-17 ENCOUNTER — Telehealth: Payer: Self-pay | Admitting: Family

## 2021-08-17 ENCOUNTER — Ambulatory Visit (INDEPENDENT_AMBULATORY_CARE_PROVIDER_SITE_OTHER): Payer: Medicare Other | Admitting: Family

## 2021-08-17 ENCOUNTER — Encounter: Payer: Self-pay | Admitting: Family

## 2021-08-17 DIAGNOSIS — G8929 Other chronic pain: Secondary | ICD-10-CM | POA: Diagnosis not present

## 2021-08-17 DIAGNOSIS — M25511 Pain in right shoulder: Secondary | ICD-10-CM | POA: Diagnosis not present

## 2021-08-17 NOTE — Telephone Encounter (Signed)
Pt came in from the [facility] at [facility], for her appt at 8:30. The appt was not for [facility], the appt was at [facility] ? ?I ask the pt who advised her to come him she said she spoke with a African-American Karma Greaser is all she new. ?

## 2021-08-17 NOTE — Progress Notes (Signed)
? ?Office Visit Note ?  ?Patient: [name]           ?Date of Birth: 1946/01/01           ?MRN: [id number] ?Visit Date: 08/17/2021 ?             ?Requested by: Jackie Plum, MD ?[phone] ?[address] ?[place],  [place] [address] ?PCP: Jackie Plum, MD ? ?No chief complaint on file. ? ? ? ? ?HPI: ?The patient is a 76 year old woman with a hx of hypertension, asthma, depression, insomnia, and OSA who comes in today complaining of a 37-month history of right shoulder pain.  There is no associated injury.  She states she is unable to use that she is right-hand dominant and unfortunately has no range of motion of the right upper extremity she is bending at the elbow to use her hand.  She has been to the emergency department for the same recently radiographs were performed. ? ?The only injury she can recall having had to the right shoulder was in 1984 ? ?Upon reviewing the chart there are no dedicated previous shoulder films however the humeral head is without abnormality on 12/2005 chest x-ray. ? ? ?Assessment & Plan: ?Visit Diagnoses:  ?1. Chronic right shoulder pain   ? ? 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  Destructive changes of the humeral head.  We will send for MRI and have her follow-up with Dr. August Saucer ? ? ? ?Follow-Up Instructions: Return mri review.  ? ?Right Shoulder Exam  ? ?Tenderness  ?The patient is experiencing tenderness in the biceps tendon. ? ?Range of Motion  ?Active abduction:  abnormal  ?Passive abduction:  abnormal  ?Internal rotation 0 degrees:  abnormal  ? ?Comments:  Unable to fire deltoid, frozen shoulder, 10 degrees of passive rom ? ? ? ? ?Patient is alert, oriented, no adenopathy, well-dressed, normal affect, normal respiratory effort. ? ? ?Imaging: ?No results found. ?No images are attached to the encounter. ? ?Labs: ?Lab Results  ?Component Value Date  ? REPTSTATUS 08/09/2017 FINAL 08/04/2017  ? GRAMSTAIN  08/04/2017  ?  ABUNDANT WBC PRESENT, PREDOMINANTLY  PMN ?NO ORGANISMS SEEN ?  ? CULT  08/04/2017  ?  RARE MULTIPLE ORGANISMS PRESENT, NONE PREDOMINANT ?NO ANAEROBES ISOLATED ?Performed at [facility], [address] [place], [place] [address] ?  ? ? ? ?Lab Results  ?Component Value Date  ? ALBUMIN 3.0 (L) 07/14/2021  ? ALBUMIN 3.4 (L) 11/12/2020  ? ALBUMIN 3.3 (L) 06/12/2020  ? ? ?No results found for: MG ?No results found for: VD25OH ? ?No results found for: PREALBUMIN ? ?  Latest Ref Rng & Units 07/13/2021  ?  9:37 PM 11/12/2020  ?  9:08 AM 11/12/2020  ?  9:01 AM  ?CBC EXTENDED  ?WBC 4.0 - 10.5 K/uL 6.2    6.2    ?RBC 3.87 - 5.11 MIL/uL 3.76    3.80    ?Hemoglobin 12.0 - 15.0 g/dL 05.3   97.6   73.4    ?HCT 36.0 - 46.0 % 32.6   35.0   33.7    ?Platelets 150 - 400 K/uL 312    249    ?NEUT# 1.7 - 7.7 K/uL   4.1    ?Lymph# 0.7 - 4.0 K/uL   1.8    ? ? ? ?There is no height or weight on file to calculate BMI. ? ?Orders:  ?Orders Placed This Encounter  ?Procedures  ? MR SHOULDER  RIGHT WO CONTRAST  ? ?No orders of the defined types were placed in this encounter. ? ? ? Procedures: ?No procedures performed ? ?Clinical Data: ?No additional findings. ? ?ROS: ? ?All other systems negative, except as noted in the HPI. ?Review of Systems  ?Constitutional:  Negative for chills and fever.  ?Musculoskeletal:  Positive for arthralgias and myalgias. Negative for neck pain.  ? ?Objective: ?Vital Signs: There were no vitals taken for this visit. ? ?Specialty Comments:  ?No specialty comments available. ? ?PMFS History: ?Patient Active Problem List  ? Diagnosis Date Noted  ? Moderate persistent asthma with acute exacerbation 12/05/2017  ? Perennial allergic rhinitis 09/20/2017  ? MDD (major depressive disorder) 07/31/2017  ? Insomnia 02/23/2015  ? OSA (obstructive sleep apnea) 02/23/2015  ? Asthma 02/23/2015  ? Chest pain on exertion 04/10/2013  ? Menopausal hot flushes 01/22/2013  ? Frequency of urination 01/22/2013  ? ?Past Medical History:  ?Diagnosis Date  ? Allergy   ? Asthma   ?  Chronic headaches   ? Endometriosis   ? Heart attack (HCC)   ? Hypertension   ? Mood swings   ?  ?Family History  ?Problem Relation Age of Onset  ? Cancer Mother   ? Heart disease Mother   ? Cancer Father   ? Heart disease Father   ?  ?Past Surgical History:  ?Procedure Laterality Date  ? ABDOMINAL HYSTERECTOMY    ? EXPLORATORY LAPAROTOMY    ? I & D EXTREMITY Left 08/04/2017  ? Procedure: IRRIGATION AND DEBRIDEMENT HAND;  Surgeon: Betha Loa, MD;  Location: [facility] OR;  Service: Orthopedics;  Laterality: Left;  ? LEFT HEART CATHETERIZATION WITH CORONARY ANGIOGRAM N/A 04/10/2013  ? Procedure: LEFT HEART CATHETERIZATION WITH CORONARY ANGIOGRAM;  Surgeon: Ricki Rodriguez, MD;  Location: [facility] CATH LAB;  Service: Cardiovascular;  Laterality: N/A;  ? ?Social History  ? ?Occupational History  ? Not on file  ?Tobacco Use  ? Smoking status: Former  ?  Types: Cigarettes  ? Smokeless tobacco: Never  ?Vaping Use  ? Vaping Use: Never used  ?Substance and Sexual Activity  ? Alcohol use: Yes  ?  Comment: occasional  ? Drug use: No  ? Sexual activity: Never  ? ? ? ? ? ?

## 2021-08-17 NOTE — Telephone Encounter (Signed)
Since there was a mix up --I went to ask Lauren what we could do for the pt .Since I had already reached out to [facility] and they advised that the pt was not there client so they could not do anything, I called Dr Steward Drone office to advised them she was here at our location. ? ?Lauren asked that I speak with Dr Steward Drone office again to tell them what happened and to see if it was OK for the pt to be seen here. ? ?Spoke with [place] and she said that it was ok to do what was right for the patient. ? ?Barnie Del seen the patient today -- ?

## 2021-08-25 ENCOUNTER — Ambulatory Visit (HOSPITAL_COMMUNITY)
Admission: RE | Admit: 2021-08-25 | Discharge: 2021-08-25 | Disposition: A | Discharge status: Home / Self Care | Payer: Medicare Other | Source: Ambulatory Visit | Attending: Family | Admitting: Family

## 2021-08-25 DIAGNOSIS — G8929 Other chronic pain: Secondary | ICD-10-CM | POA: Insufficient documentation

## 2021-08-25 DIAGNOSIS — M25511 Pain in right shoulder: Secondary | ICD-10-CM | POA: Insufficient documentation

## 2021-09-08 ENCOUNTER — Ambulatory Visit: Payer: Medicare Other | Admitting: Orthopedic Surgery

## 2021-09-08 ENCOUNTER — Telehealth: Payer: Self-pay | Admitting: Orthopedic Surgery

## 2021-09-08 NOTE — Telephone Encounter (Signed)
Pt had to cxl appt, however needs something called in for Pain . ?

## 2021-09-08 NOTE — Telephone Encounter (Signed)
Pt rescheduled from today 09/08/21 to 09/19/21 for MRI review with Dr. Lajoyce Corners. She was seen on 08/17/21 for right shoulder pain. ?

## 2021-09-09 MED ORDER — ACETAMINOPHEN-CODEINE #3 300-30 MG PO TABS
1.0000 | ORAL_TABLET | Freq: Four times a day (QID) | ORAL | 0 refills | Status: DC | PRN
Start: 2021-09-09 — End: 2022-07-03

## 2021-09-19 ENCOUNTER — Ambulatory Visit (INDEPENDENT_AMBULATORY_CARE_PROVIDER_SITE_OTHER): Payer: Medicare Other | Admitting: Orthopedic Surgery

## 2021-09-19 ENCOUNTER — Encounter: Payer: Self-pay | Admitting: Orthopedic Surgery

## 2021-09-19 DIAGNOSIS — M25511 Pain in right shoulder: Secondary | ICD-10-CM

## 2021-09-19 DIAGNOSIS — G8929 Other chronic pain: Secondary | ICD-10-CM | POA: Diagnosis not present

## 2021-09-19 DIAGNOSIS — M25552 Pain in left hip: Secondary | ICD-10-CM

## 2021-09-19 NOTE — Progress Notes (Signed)
? ?Office Visit Note ?  ?Patient: [name]           ?Date of Birth: [birth year]           ?MRN: [id number] ?Visit Date: 09/19/2021 ?             ?Requested by: Benito Mccreedy, MD ?[address] ?[address] ?[place],  [place] [address] ?PCP: Benito Mccreedy, MD ? ?Chief Complaint  ?Patient presents with  ? Right Shoulder - Follow-up  ?  MRI review  ? ? ? ? ?HPI: ?Patient is a 76 year old woman who is seen in follow-up she is status post an MRI scan of her right shoulder.  Patient states she cannot move her shoulder.  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 ? ?Assessment & Plan: ?Visit Diagnoses:  ?1. Chronic right shoulder pain   ?2. 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 Pain in left hip   ? ? ?Plan: We will have her follow-up with Dr. Marlou Sa for evaluation for reverse total shoulder arthroplasty.  Patient most likely will need discharge to skilled nursing after surgery. ? ?Follow-Up Instructions: Return in about 1 week (around 09/26/2021) for Follow-up with Dr. Marlou Sa..  ? ?Ortho Exam ? ?Patient is alert, oriented, no adenopathy, well-dressed, normal affect, normal respiratory effort. ?Examination patient has no active abduction or flexion of the right shoulder.  Passively there is bony cogwheel movement with essentially no internal or external rotation and abduction and flexion to 45 degrees which is painful.  Review of the MRI scan shows rotator cuff pathology biceps tendon pathology as well as severe osteoarthritis of the right shoulder.  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  Patient also has limited range of motion of the left hip and walks with an abductor lurch with a flexed gait.  Review of her CT scan does not show any specific bony abnormalities of her lumbar spine or her hips. ? ?Imaging: ?No results found. ?No  images are attached to the encounter. ? ?Labs: ?Lab Results  ?Component Value Date  ? REPTSTATUS 08/09/2017 FINAL 08/04/2017  ? GRAMSTAIN  08/04/2017  ?  ABUNDANT WBC PRESENT, PREDOMINANTLY PMN ?NO ORGANISMS SEEN ?  ? CULT  08/04/2017  ?  RARE MULTIPLE ORGANISMS PRESENT, NONE PREDOMINANT ?NO ANAEROBES ISOLATED ?Performed at [facility], [place] [address] [place], [place] [address] ?  ? ? ? ?Lab Results  ?Component Value Date  ? ALBUMIN 3.0 (L) 07/14/2021  ? ALBUMIN 3.4 (L) 11/12/2020  ? ALBUMIN 3.3 (L) 06/12/2020  ? ? ?No results found for: MG ?No results found for: VD25OH ? ?No results found for: PREALBUMIN ? ?  Latest Ref Rng & Units 07/13/2021  ?  9:37 PM 11/12/2020  ?  9:08 AM 11/12/2020  ?  9:01 AM  ?CBC EXTENDED  ?WBC 4.0 - 10.5 K/uL 6.2    6.2    ?RBC 3.87 - 5.11 MIL/uL 3.76    3.80    ?Hemoglobin 12.0 - 15.0 g/dL 11.6   11.9   12.1    ?HCT 36.0 - 46.0 % 32.6   35.0   33.7    ?Platelets 150 - 400 K/uL 312    249    ?NEUT# 1.7 - 7.7 K/uL   4.1    ?Lymph# 0.7 - 4.0 K/uL  1.8    ? ? ? ?There is no height or weight on file to calculate BMI. ? ?Orders:  ?No orders of the defined types were placed in this encounter. ? ?No orders of the defined types were placed in this encounter. ? ? ? Procedures: ?No procedures performed ? ?Clinical Data: ?No additional findings. ? ?ROS: ? ?All other systems negative, except as noted in the HPI. ?Review of Systems ? ?Objective: ?Vital Signs: There were no vitals taken for this visit. ? ?Specialty Comments:  ?No specialty comments available. ? ?PMFS History: ?Patient Active Problem List  ? Diagnosis Date Noted  ? Moderate persistent asthma with acute exacerbation 12/05/2017  ? Perennial allergic rhinitis 09/20/2017  ? MDD (major depressive disorder) 07/31/2017  ? Insomnia 02/23/2015  ? OSA (obstructive sleep apnea) 02/23/2015  ? Asthma 02/23/2015  ? Chest pain on exertion 04/10/2013  ? Menopausal hot flushes 01/22/2013  ? Frequency of urination 01/22/2013  ? ?Past Medical History:   ?Diagnosis Date  ? Allergy   ? Asthma   ? Chronic headaches   ? Endometriosis   ? Heart attack ([place])   ? Hypertension   ? Mood swings   ?  ?Family History  ?Problem Relation Age of Onset  ? Cancer Mother   ? Heart disease Mother   ? Cancer Father   ? Heart disease Father   ?  ?Past Surgical History:  ?Procedure Laterality Date  ? ABDOMINAL HYSTERECTOMY    ? EXPLORATORY LAPAROTOMY    ? I & D EXTREMITY Left 08/04/2017  ? Procedure: IRRIGATION AND DEBRIDEMENT HAND;  Surgeon: Leanora Cover, MD;  Location: [facility];  Service: Orthopedics;  Laterality: Left;  ? LEFT HEART CATHETERIZATION WITH CORONARY ANGIOGRAM N/A 04/10/2013  ? Procedure: LEFT HEART CATHETERIZATION WITH CORONARY ANGIOGRAM;  Surgeon: Birdie Riddle, MD;  Location: [facility];  Service: Cardiovascular;  Laterality: N/A;  ? ?Social History  ? ?Occupational History  ? Not on file  ?Tobacco Use  ? Smoking status: Former  ?  Types: Cigarettes  ? Smokeless tobacco: Never  ?Vaping Use  ? Vaping Use: Never used  ?Substance and Sexual Activity  ? Alcohol use: Yes  ?  Comment: occasional  ? Drug use: No  ? Sexual activity: Never  ? ? ? ? ? ?

## 2021-10-05 ENCOUNTER — Ambulatory Visit: Payer: Medicare Other | Admitting: Orthopedic Surgery

## 2021-10-06 ENCOUNTER — Ambulatory Visit (INDEPENDENT_AMBULATORY_CARE_PROVIDER_SITE_OTHER): Payer: Medicare Other | Admitting: Orthopedic Surgery

## 2021-10-06 DIAGNOSIS — M19011 Primary osteoarthritis, right shoulder: Secondary | ICD-10-CM | POA: Diagnosis not present

## 2021-10-09 ENCOUNTER — Encounter: Payer: Self-pay | Admitting: Orthopedic Surgery

## 2021-10-09 NOTE — Progress Notes (Signed)
Office Visit Note   Patient: [name]           Date of Birth: 1945/08/03           MRN: [id number] Visit Date: 10/06/2021 Requested by: Jackie Plum, MD [address] [place],  [place] [address] PCP: Jackie Plum, MD  Subjective: Chief Complaint  Patient presents with   Right Shoulder - Pain    HPI: [name] is a 76 year old patient with right shoulder pain of longstanding duration.  Been worse over the past several months.  She is right-hand dominant.  Reports constant pain which does wake her from sleep at night.  She feels grinding in her arm.  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  She is currently homeless.  Had heart attack 6 years ago.  Her range of motion and functional activity is severely limited by both the right shoulder arthritis and the left arm functional disability.  She does do theater type work.              ROS: All systems reviewed are negative as they relate to the chief complaint within the history of present illness.  Patient denies  fevers or chills.   Assessment & Plan: Visit Diagnoses:  1. Arthritis of right shoulder region     Plan: Impression is right shoulder pain with severe arthritis and functional limitation and pain.  Plan is CT scan right shoulder preop RSA.  She would have to have a lot of medical preoperative evaluation particular nutritional status and cardiac status along with social evaluation to make sure she has the support network necessary to help her get through this procedure.  CT scan first to evaluate glenoid bone stock and follow-up after that study  Follow-Up Instructions: Return for after MRI.   Orders:  No orders of the defined types were placed in this encounter.  No orders of the defined types were placed in this encounter.     Procedures: No procedures performed   Clinical Data: No additional findings.  Objective: Vital Signs: There were no vitals taken for this  visit.  Physical Exam:   Constitutional: Patient appears well-developed HEENT:  Head: Normocephalic Eyes:EOM are normal Neck: Normal range of motion Cardiovascular: Normal rate Pulmonary/chest: Effort normal Neurologic: Patient is alert Skin: Skin is warm Psychiatric: Patient has normal mood and affect   Ortho Exam: Ortho exam demonstrates pretty reasonable cervical spine range of motion.  Internal rotation contracture present on the left-hand side with minimal function of the shoulder consistent with diagnosis of Erbs palsy on the right-hand side she has forward flexion and abduction both below 45 degrees but the deltoid is functional.  Passive range of motion is 10/40/45.  Motor or sensory function to the hand is intact.  Radial pulse intact.  Specialty Comments:  No specialty comments available.  Imaging: No results found.   PMFS History: Patient Active Problem List   Diagnosis Date Noted   Moderate persistent asthma with acute exacerbation 12/05/2017   Perennial allergic rhinitis 09/20/2017   MDD (major depressive disorder) 07/31/2017   Insomnia 02/23/2015   OSA (obstructive sleep apnea) 02/23/2015   Asthma 02/23/2015   Chest pain on exertion 04/10/2013   Menopausal hot flushes 01/22/2013   Frequency of urination 01/22/2013   Past Medical History:  Diagnosis Date   Allergy    Asthma    Chronic headaches    Endometriosis    Heart attack (HCC)    Hypertension  Mood swings     Family History  Problem Relation Age of Onset   Cancer Mother    Heart disease Mother    Cancer Father    Heart disease Father     Past Surgical History:  Procedure Laterality Date   ABDOMINAL HYSTERECTOMY     EXPLORATORY LAPAROTOMY     I & D EXTREMITY Left 08/04/2017   Procedure: IRRIGATION AND DEBRIDEMENT HAND;  Surgeon: Betha Loa, MD;  [facility];  Service: Orthopedics;  Laterality: Left;   LEFT HEART CATHETERIZATION WITH CORONARY ANGIOGRAM N/A 04/10/2013   Procedure:  LEFT HEART CATHETERIZATION WITH CORONARY ANGIOGRAM;  Surgeon: Ricki Rodriguez, MD;  Location: [facility];  Service: Cardiovascular;  Laterality: N/A;   Social History   Occupational History   Not on file  Tobacco Use   Smoking status: Former    Types: Cigarettes   Smokeless tobacco: Never  Vaping Use   Vaping Use: Never used  Substance and Sexual Activity   Alcohol use: Yes    Comment: occasional   Drug use: No   Sexual activity: Never

## 2021-10-10 ENCOUNTER — Other Ambulatory Visit: Payer: Self-pay

## 2021-10-10 NOTE — Addendum Note (Signed)
Addended byPrescott Parma on: 10/10/2021 03:51 PM   Modules accepted: Orders

## 2021-10-26 ENCOUNTER — Encounter: Payer: Self-pay | Admitting: *Deleted

## 2021-12-28 ENCOUNTER — Ambulatory Visit (INDEPENDENT_AMBULATORY_CARE_PROVIDER_SITE_OTHER): Payer: Medicare Other | Admitting: Orthopedic Surgery

## 2021-12-28 ENCOUNTER — Encounter: Payer: Self-pay | Admitting: Orthopedic Surgery

## 2021-12-28 DIAGNOSIS — G8929 Other chronic pain: Secondary | ICD-10-CM

## 2021-12-28 DIAGNOSIS — M25511 Pain in right shoulder: Secondary | ICD-10-CM | POA: Diagnosis not present

## 2021-12-28 NOTE — Progress Notes (Signed)
Office Visit Note   Patient: [name]           Date of Birth: 1945/08/25           MRN: [id number] Visit Date: 12/28/2021 Requested by: Jackie Plum, MD [address] [place],  [place] [address] PCP: Jackie Plum, MD  Subjective: Chief Complaint  Patient presents with   Right Shoulder - Pain    HPI: Is a 76 year old patient with known history of right shoulder arthritis.  She was recommended for CT scan of the right shoulder for preop RSA.  Has not had that scan done yet.  That is reordered.  She is having a lot of pain in the shoulder which is not unexpected based on the severity of the arthritis present.  More importantly she is been having some medical issues.  Reports several week history of bilateral leg swelling.  Had a heart attack 6 years ago but has not seen cardiologist since.  Was having some chest pain 3 weeks ago and was told by her report by her primary care provider that he has gastritis.  She will she does take nitroglycerin occasionally.  Has been using aspirin and Tylenol for her shoulder pain.              ROS: All systems reviewed are negative as they relate to the chief complaint within the history of present illness.  Patient denies  fevers or chills.   Assessment & Plan: Visit Diagnoses:  1. Chronic right shoulder pain     Plan: Impression is right shoulder arthritis.  Did get CT scan pending for preop RSA.  More importantly she has a multitude of medical issues ongoing at this time.  She does have diminished pedal pulses.  Some of her leg pain could be coming from diminished blood flow to her lower extremities.  Feet are warm but pulses are diminished.  Plan is for ABI bilaterally to evaluate perfusion to the foot.  She needs to see a cardiologist as well either MD or PA for evaluation.  We will see her back in several months after she has these medical issues worked out.  Follow-Up Instructions: No follow-ups on file.   Orders:   Orders Placed This Encounter  Procedures   CT SHOULDER RIGHT WO CONTRAST   Ambulatory referral to Cardiology   VAS [place] ABI WITH/WO TBI   No orders of the defined types were placed in this encounter.     Procedures: No procedures performed   Clinical Data: No additional findings.  Objective: Vital Signs: There were no vitals taken for this visit.  Physical Exam:   Constitutional: Patient appears well-developed HEENT:  Head: Normocephalic Eyes:EOM are normal Neck: Normal range of motion Cardiovascular: Normal rate Pulmonary/chest: Effort normal Neurologic: Patient is alert Skin: Skin is warm Psychiatric: Patient has normal mood and affect   Ortho Exam: Ortho exam demonstrates warm feet but pulses are diminished dorsalis pedis and posterior tib.  She has good ankle dorsiflexion plantarflexion strength.  Swelling is present in the bilateral lower extremities compatible with 3+ pitting edema.  That does not extend above the knee.  Shoulder range of motion and exam otherwise unchanged on the right  Specialty Comments:  No specialty comments available.  Imaging: No results found.   PMFS History: Patient Active Problem List   Diagnosis Date Noted   Moderate persistent asthma with acute exacerbation 12/05/2017   Perennial allergic rhinitis 09/20/2017   MDD (major depressive disorder)  07/31/2017   Insomnia 02/23/2015   OSA (obstructive sleep apnea) 02/23/2015   Asthma 02/23/2015   Chest pain on exertion 04/10/2013   Menopausal hot flushes 01/22/2013   Frequency of urination 01/22/2013   Past Medical History:  Diagnosis Date   Allergy    Asthma    Chronic headaches    Endometriosis    Heart attack (HCC)    Hypertension    Mood swings     Family History  Problem Relation Age of Onset   Cancer Mother    Heart disease Mother    Cancer Father    Heart disease Father     Past Surgical History:  Procedure Laterality Date   ABDOMINAL HYSTERECTOMY      EXPLORATORY LAPAROTOMY     I & D EXTREMITY Left 08/04/2017   Procedure: IRRIGATION AND DEBRIDEMENT HAND;  Surgeon: Betha Loa, MD;  [facility];  Service: Orthopedics;  Laterality: Left;   LEFT HEART CATHETERIZATION WITH CORONARY ANGIOGRAM N/A 04/10/2013   Procedure: LEFT HEART CATHETERIZATION WITH CORONARY ANGIOGRAM;  Surgeon: Ricki Rodriguez, MD;  Location: [facility];  Service: Cardiovascular;  Laterality: N/A;   Social History   Occupational History   Not on file  Tobacco Use   Smoking status: Former    Types: Cigarettes   Smokeless tobacco: Never  Vaping Use   Vaping Use: Never used  Substance and Sexual Activity   Alcohol use: Yes    Comment: occasional   Drug use: No   Sexual activity: Never

## 2022-01-03 ENCOUNTER — Other Ambulatory Visit: Payer: Self-pay | Admitting: Internal Medicine

## 2022-01-11 ENCOUNTER — Ambulatory Visit (HOSPITAL_COMMUNITY)
Admission: RE | Admit: 2022-01-11 | Discharge: 2022-01-11 | Disposition: A | Discharge status: Home / Self Care | Payer: Medicare Other | Source: Ambulatory Visit | Attending: Orthopedic Surgery | Admitting: Orthopedic Surgery

## 2022-01-11 DIAGNOSIS — G8929 Other chronic pain: Secondary | ICD-10-CM | POA: Insufficient documentation

## 2022-01-11 DIAGNOSIS — M25511 Pain in right shoulder: Secondary | ICD-10-CM | POA: Insufficient documentation

## 2022-01-11 NOTE — Progress Notes (Signed)
ABI has been completed.   Preliminary results in CV Proc.   [name] 01/11/2022 9:29 AM

## 2022-01-26 ENCOUNTER — Other Ambulatory Visit: Payer: Medicare Other

## 2022-02-02 NOTE — Progress Notes (Unsigned)
Cardiology Office Note:    Date:  02/02/2022   ID:  [name], DOB 07/06/1945, MRN [id number]  PCP:  Jackie Plum, MD   [facility] Providers Cardiologist:  None   Referring MD: Cammy Copa, MD    History of Present Illness:    [name] is a 76 y.o. female with a hx of OSA, asthma, HTN and depression who was referred by Dr. August Saucer for further evaluation of chest pain and LE edema.   Patient was seen by Dr. August Saucer on 12/28/21. Note reviewed. Patient reported history of MI 6 years ago but has not followed up since that time. Was complaining of chest pain and LE edema. Given symptoms and history, she is now referred to Cardiology for further management.  Today, ***  Past Medical History:  Diagnosis Date   Allergy    Asthma    Chronic headaches    Endometriosis    Heart attack (HCC)    Hypertension    Mood swings     Past Surgical History:  Procedure Laterality Date   ABDOMINAL HYSTERECTOMY     EXPLORATORY LAPAROTOMY     I & D EXTREMITY Left 08/04/2017   Procedure: IRRIGATION AND DEBRIDEMENT HAND;  Surgeon: Betha Loa, MD;  [facility];  Service: Orthopedics;  Laterality: Left;   LEFT HEART CATHETERIZATION WITH CORONARY ANGIOGRAM N/A 04/10/2013   Procedure: LEFT HEART CATHETERIZATION WITH CORONARY ANGIOGRAM;  Surgeon: Ricki Rodriguez, MD;  Location: [facility];  Service: Cardiovascular;  Laterality: N/A;    Current Medications: No outpatient medications have been marked as taking for the 02/08/22 encounter (Appointment) with Meriam Sprague, MD.     Allergies:   Patient has no known allergies.   Social History   Socioeconomic History   Marital status: Divorced    Spouse name: Not on file   Number of children: Not on file   Years of education: Not on file   Highest education level: Not on file  Occupational History   Not on file  Tobacco Use   Smoking status: Former    Types: Cigarettes   Smokeless tobacco: Never   Vaping Use   Vaping Use: Never used  Substance and Sexual Activity   Alcohol use: Yes    Comment: occasional   Drug use: No   Sexual activity: Never  Other Topics Concern   Not on file  Social History Narrative   Not on file   Social Determinants of Health   Financial Resource Strain: Not on file  Food Insecurity: Not on file  Transportation Needs: Not on file  Physical Activity: Not on file  Stress: Not on file  Social Connections: Not on file     Family History: The patient's ***family history includes Cancer in her father and mother; Heart disease in her father and mother.  ROS:   Please see the history of present illness.    *** All other systems reviewed and are negative.  EKGs/Labs/Other Studies Reviewed:    The following studies were reviewed today: TTE 09/09/17: LVEF normal, normal LV size, G1DD, no significant valve disease.   EKG:  EKG is *** ordered today.  The ekg ordered today demonstrates ***  Recent Labs: 07/13/2021: BUN 11; Creatinine, Ser 0.97; Hemoglobin 11.6; Platelets 312; Potassium 3.9; Sodium 137 07/14/2021: ALT 14  Recent Lipid Panel No results found for: "CHOL", "TRIG", "HDL", "CHOLHDL", "VLDL", "LDLCALC", "LDLDIRECT"   Risk Assessment/Calculations:   {Does this patient have ATRIAL FIBRILLATION?:[phone]}  No BP recorded.  {Refresh Note OR Click here to enter BP  :1}***         Physical Exam:    VS:  There were no vitals taken for this visit.    Wt Readings from Last 3 Encounters:  11/09/20 164 lb 12.8 oz (74.8 kg)  05/06/19 192 lb 6.4 oz (87.3 kg)  09/20/17 170 lb (77.1 kg)     GEN: *** Well nourished, well developed in no acute distress HEENT: Normal NECK: No JVD; No carotid bruits LYMPHATICS: No lymphadenopathy CARDIAC: ***RRR, no murmurs, rubs, gallops RESPIRATORY:  Clear to auscultation without rales, wheezing or rhonchi  ABDOMEN: Soft, non-tender, non-distended MUSCULOSKELETAL:  No edema; No deformity  SKIN: Warm and  dry NEUROLOGIC:  Alert and oriented x 3 PSYCHIATRIC:  Normal affect   ASSESSMENT:    No diagnosis found. PLAN:    In order of problems listed above:  #Chest Pain: #History of MI: Has not followed regularly with Cardiology. Last TTE 2019 with normal EF, no significant valve disease.  -Start ASA 81mg  daily -Start crestor 20mg  daily -Continue metop 25mg  XL daily  #HTN: -Continue amlodipine 2.5mg  daily -Continue metop 25mg  XL daily  #LE Edema: Last TTE in 2019 (per report only) with normal EF, no significant valve disease.       {Are you ordering a CV Procedure (e.g. stress test, cath, DCCV, TEE, etc)?   Press F2        :[id number]}    Medication Adjustments/Labs and Tests Ordered: Current medicines are reviewed at length with the patient today.  Concerns regarding medicines are outlined above.  No orders of the defined types were placed in this encounter.  No orders of the defined types were placed in this encounter.   There are no Patient Instructions on file for this visit.   Signed, Freada Bergeron, MD  02/02/2022 1:58 PM    [place]

## 2022-02-08 ENCOUNTER — Ambulatory Visit: Payer: Medicare Other | Attending: Cardiology | Admitting: Cardiology

## 2022-02-08 ENCOUNTER — Encounter: Payer: Self-pay | Admitting: Cardiology

## 2022-02-08 VITALS — BP 124/86 | HR 79 | Ht 62.0 in | Wt 168.6 lb

## 2022-02-08 DIAGNOSIS — Z79899 Other long term (current) drug therapy: Secondary | ICD-10-CM

## 2022-02-08 DIAGNOSIS — R600 Localized edema: Secondary | ICD-10-CM | POA: Diagnosis not present

## 2022-02-08 DIAGNOSIS — R079 Chest pain, unspecified: Secondary | ICD-10-CM | POA: Diagnosis not present

## 2022-02-08 DIAGNOSIS — I10 Essential (primary) hypertension: Secondary | ICD-10-CM

## 2022-02-08 NOTE — Progress Notes (Signed)
Cardiology Office Note:    Date:  02/08/2022   ID:  [name], DOB 11-17-1945, MRN [id number]  PCP:  Benito Mccreedy, MD   [place] Providers Cardiologist:  None   Referring MD: Meredith Pel, MD    History of Present Illness:    [name] is a 76 y.o. female with a hx of OSA, asthma, HTN and depression who was referred by Dr. Marlou Sa for further evaluation of chest pain and LE edema.   Patient was seen by Dr. Marlou Sa on 12/28/21. Note reviewed. Patient reported history of MI 6 years ago but review of cath report from 2014 with no significant coronary disease. Has not been seen regularly by a Cardiologist.  Was complaining of chest pain and LE edema with Dr. Garret Reddish. Given symptoms, she is now referred to Cardiology for further management.  Today, the patient states that she is in a lot of pain, "hurting all over". She attributes this to her rheumatoid arthritis. A few months ago she presented to the ED regarding her pain. Two days ago her pain caused her to think she may be having a heart attack, similar to what she experienced during her heart attack 6 years ago.  She used to have more severe chest pain and breathing issues. Currently, she endorses mild shortness of breath. Lately her breathing has improved overall and her asthma is stable. She denies any exertional chest pain and states her breathing is sporadic.  Over two weeks ago her legs were 2-3x their current size. She takes furosemide once a day, but is not sure if this is very effective. Tries to watch her salt and fluid intake to prevent significant swelling. Sometimes cannot take her lasix due to inability to find an easily accessible restroom.  She denies any palpitations, lightheadedness, headaches, syncope, orthopnea, or PND.   Past Medical History:  Diagnosis Date   Allergy    Asthma    Chronic headaches    Endometriosis    Heart attack ([place])    Hypertension    Mood swings     Past  Surgical History:  Procedure Laterality Date   ABDOMINAL HYSTERECTOMY     EXPLORATORY LAPAROTOMY     I & D EXTREMITY Left 08/04/2017   Procedure: IRRIGATION AND DEBRIDEMENT HAND;  Surgeon: Leanora Cover, MD;  Location: [facility];  Service: Orthopedics;  Laterality: Left;   LEFT HEART CATHETERIZATION WITH CORONARY ANGIOGRAM N/A 04/10/2013   Procedure: LEFT HEART CATHETERIZATION WITH CORONARY ANGIOGRAM;  Surgeon: Birdie Riddle, MD;  Location: [facility];  Service: Cardiovascular;  Laterality: N/A;    Current Medications: Current Meds  Medication Sig   acetaminophen-codeine (TYLENOL #3) 300-30 MG tablet Take 1 tablet by mouth every 6 (six) hours as needed for moderate pain.   albuterol (PROVENTIL) (2.5 MG/3ML) 0.083% nebulizer solution Use one vial in the nebulizer every 4-6 hours if needed for coughing, shortness of breath, or wheezing.   albuterol (VENTOLIN HFA) 108 (90 Base) MCG/ACT inhaler INHALE 2 PUFFS BY MOUTH EVERY 4 HOURS AS NEEDED FOR WHEEZING OR SHORTNESS OF BREATH. PLEASE KEEP SCHEDULED OFFICE VISIT FOR FURTHER REFILLS. (Patient taking differently: Inhale 2 puffs into the lungs every 4 (four) hours as needed for wheezing or shortness of breath.)   amLODipine (NORVASC) 2.5 MG tablet Take 2.5 mg by mouth daily.   esomeprazole (NEXIUM) 40 MG capsule Take 1 capsule (40 mg total) by mouth daily at 12 noon.   fluticasone (FLONASE) 50 MCG/ACT nasal spray  Place 2 sprays into both nostrils daily.   lidocaine (LIDODERM) 5 % Place 1 patch onto the skin daily as needed. Apply patch to area most significant pain once per day.  Remove and discard patch within 12 hours of application.   meclizine (ANTIVERT) 12.5 MG tablet Take 1 tablet (12.5 mg total) by mouth 3 (three) times daily as needed for dizziness.   metoprolol succinate (TOPROL-XL) 25 MG 24 hr tablet Take 25 mg by mouth daily.   nitroGLYCERIN (NITROSTAT) 0.3 MG SL tablet Place 0.3 mg under the tongue every 5 (five) minutes as needed for chest  pain.   Tiotropium Bromide Monohydrate (SPIRIVA RESPIMAT) 1.25 MCG/ACT AERS Inhale 2 puffs into the lungs daily.   umeclidinium bromide (INCRUSE ELLIPTA) 62.5 MCG/INH AEPB Inhale 1 puff into the lungs daily.     Allergies:   Patient has no known allergies.   Social History   Socioeconomic History   Marital status: Divorced    Spouse name: Not on file   Number of children: Not on file   Years of education: Not on file   Highest education level: Not on file  Occupational History   Not on file  Tobacco Use   Smoking status: Former    Types: Cigarettes   Smokeless tobacco: Never  Vaping Use   Vaping Use: Never used  Substance and Sexual Activity   Alcohol use: Yes    Comment: occasional   Drug use: No   Sexual activity: Never  Other Topics Concern   Not on file  Social History Narrative   Not on file   Social Determinants of Health   Financial Resource Strain: Not on file  Food Insecurity: Not on file  Transportation Needs: Not on file  Physical Activity: Not on file  Stress: Not on file  Social Connections: Not on file     Family History: The patient's family history includes Cancer in her father and mother; Heart disease in her father and mother.  ROS:   Review of Systems  Constitutional:  Negative for chills and fever.  HENT:  Negative for nosebleeds and tinnitus.   Eyes:  Negative for blurred vision and pain.  Respiratory:  Positive for shortness of breath. Negative for cough, hemoptysis and stridor.   Cardiovascular:  Positive for chest pain and leg swelling. Negative for palpitations, orthopnea, claudication and PND.  Gastrointestinal:  Negative for blood in stool, diarrhea, nausea and vomiting.  Genitourinary:  Negative for dysuria and hematuria.  Musculoskeletal:  Positive for joint pain and myalgias. Negative for falls.  Neurological:  Negative for dizziness, loss of consciousness and headaches.  Psychiatric/Behavioral:  Negative for depression,  hallucinations and substance abuse. The patient does not have insomnia.      EKGs/Labs/Other Studies Reviewed:    The following studies were reviewed today:  ABI Doppler  01/11/2022: Summary:  Right: Resting right ankle-brachial index indicates noncompressible right  lower extremity arteries. The right toe-brachial index is normal.   Left: Resting left ankle-brachial index indicates noncompressible left  lower extremity arteries. The left toe-brachial index is normal.   TTE 2019: LVEF normal, normal LV size, G1DD, no significant valve disease.   EKG:  EKG is personally reviewed. 02/08/2022:  Sinus rhythm. Rate 79 bpm.   Recent Labs: 07/13/2021: BUN 11; Creatinine, Ser 0.97; Hemoglobin 11.6; Platelets 312; Potassium 3.9; Sodium 137 07/14/2021: ALT 14   Recent Lipid Panel No results found for: "CHOL", "TRIG", "HDL", "CHOLHDL", "VLDL", "LDLCALC", "LDLDIRECT"   Risk Assessment/Calculations:  Physical Exam:    VS:  BP 124/86   Pulse 79   Ht 5\' 2"  (1.575 m)   Wt 168 lb 9.6 oz (76.5 kg)   SpO2 (!) 89%   BMI 30.84 kg/m     Wt Readings from Last 3 Encounters:  02/08/22 168 lb 9.6 oz (76.5 kg)  11/09/20 164 lb 12.8 oz (74.8 kg)  05/06/19 192 lb 6.4 oz (87.3 kg)     GEN: Comfortable HEENT: Normal NECK: No JVD; No carotid bruits CARDIAC: RRR, 2/6 systolic murmur, No rubs, no gallops RESPIRATORY:  Clear to auscultation without rales, wheezing or rhonchi  ABDOMEN: Soft, non-tender, non-distended MUSCULOSKELETAL:  2+ LE edema bilaterally; No deformity  SKIN: Warm and dry NEUROLOGIC:  Alert and oriented x 3 PSYCHIATRIC:  Normal affect   ASSESSMENT:    1. Chest pain of uncertain etiology   2. Bilateral lower extremity edema   3. Medication management   4. Primary hypertension    PLAN:    In order of problems listed above:  #Chest Pain: Cath in 2014 without significant disease. Last TTE 2019 with normal EF, no significant valve disease. Does not have  significant exertional symptoms currently and states she overall feels better than previously. Will check TTE for further work-up. -Check TTE -Check lipids -Continue metop 25mg  XL daily  #HTN: Well controlled and at goal. -Continue amlodipine 2.5mg  daily -Continue metop 25mg  XL daily  #LE Edema: Last TTE in 2019 (per report only) with normal EF, no significant valve disease. Currently with 2+ LE edema. Has lasix 20mg  (?) at home which helps symptoms. Will repeat TTE and check BNP to assess further. -Continue lasix 20mg  daily -Check TTE -Check BNP -Could consider stopping amlodipine to see if this helps symptoms pending above work-up          Follow-up:  6 months.  Medication Adjustments/Labs and Tests Ordered: Current medicines are reviewed at length with the patient today.  Concerns regarding medicines are outlined above.   Orders Placed This Encounter  Procedures   Basic metabolic panel   Pro b natriuretic peptide   Lipid Profile   EKG 12-Lead   ECHOCARDIOGRAM COMPLETE   No orders of the defined types were placed in this encounter.  Patient Instructions  Medication Instructions:   Your physician recommends that you continue on your current medications as directed. Please refer to the Current Medication list given to you today.  *If you need a refill on your cardiac medications before your next appointment, please call your pharmacy*   Lab Work:  TODAY--BMET, PRO-BNP, AND LIPIDS  If you have labs (blood work) drawn today and your tests are completely normal, you will receive your results only by: [place] (if you have MyChart) OR A paper copy in the mail If you have any lab test that is abnormal or we need to change your treatment, we will call you to review the results.   Testing/Procedures:  Your physician has requested that you have an echocardiogram. Echocardiography is a painless test that uses sound waves to create images of your heart. It  provides your doctor with information about the size and shape of your heart and how well your heart's [place] and valves are working. This procedure takes approximately one hour. There are no restrictions for this procedure.   Follow-Up: At [facility], you and your health needs are our priority.  As part of our continuing mission to provide you with exceptional heart care, we have created designated  Provider Care Teams.  These Care Teams include your primary Cardiologist (physician) and Advanced Practice Providers (APPs -  Physician Assistants and Nurse Practitioners) who all work together to provide you with the care you need, when you need it.  We recommend signing up for the patient portal called "MyChart".  Sign up information is provided on this After Visit Summary.  MyChart is used to connect with patients for Virtual Visits (Telemedicine).  Patients are able to view lab/test results, encounter notes, upcoming appointments, etc.  Non-urgent messages can be sent to your provider as well.   To learn more about what you can do with MyChart, go to [email or web address].    Your next appointment:   6 month(s)  The format for your next appointment:   In Person  Provider:   Robbie Lis, PA-C, Nicholes Rough, PA-C, Melina Copa, PA-C, Ambrose Pancoast, NP, Cecilie Kicks, NP, Ermalinda Barrios, PA-C, Christen Bame, NP, or Richardson Dopp, PA-C           Important Information About Sugar         I,Mathew Stumpf,acting as a scribe for Freada Bergeron, MD.,have documented all relevant documentation on the behalf of Freada Bergeron, MD,as directed by  Freada Bergeron, MD while in the presence of Freada Bergeron, MD.  I, Freada Bergeron, MD, have reviewed all documentation for this visit. The documentation on 02/08/22 for the exam, diagnosis, procedures, and orders are all accurate and complete.   Signed, Freada Bergeron, MD  02/08/2022 10:22 AM    [place]

## 2022-02-08 NOTE — Patient Instructions (Signed)
Medication Instructions:   Your physician recommends that you continue on your current medications as directed. Please refer to the Current Medication list given to you today.  *If you need a refill on your cardiac medications before your next appointment, please call your pharmacy*   Lab Work:  TODAY--BMET, PRO-BNP, AND LIPIDS  If you have labs (blood work) drawn today and your tests are completely normal, you will receive your results only by: [place] (if you have MyChart) OR A paper copy in the mail If you have any lab test that is abnormal or we need to change your treatment, we will call you to review the results.   Testing/Procedures:  Your physician has requested that you have an echocardiogram. Echocardiography is a painless test that uses sound waves to create images of your heart. It provides your doctor with information about the size and shape of your heart and how well your heart's [place] and valves are working. This procedure takes approximately one hour. There are no restrictions for this procedure.   Follow-Up: At [facility], you and your health needs are our priority.  As part of our continuing mission to provide you with exceptional heart care, we have created designated Provider Care Teams.  These Care Teams include your primary Cardiologist (physician) and Advanced Practice Providers (APPs -  Physician Assistants and Nurse Practitioners) who all work together to provide you with the care you need, when you need it.  We recommend signing up for the patient portal called "MyChart".  Sign up information is provided on this After Visit Summary.  MyChart is used to connect with patients for Virtual Visits (Telemedicine).  Patients are able to view lab/test results, encounter notes, upcoming appointments, etc.  Non-urgent messages can be sent to your provider as well.   To learn more about what you can do with MyChart, go to [email or web address].     Your next appointment:   6 month(s)  The format for your next appointment:   In Person  Provider:   Robbie Lis, PA-C, Nicholes Rough, PA-C, Melina Copa, PA-C, Ambrose Pancoast, NP, Cecilie Kicks, NP, Ermalinda Barrios, PA-C, Christen Bame, NP, or Richardson Dopp, PA-C           Important Information About Sugar

## 2022-02-09 LAB — BASIC METABOLIC PANEL
BUN/Creatinine Ratio: 20 (ref 12–28)
BUN: 14 mg/dL (ref 8–27)
CO2: 21 mmol/L (ref 20–29)
Calcium: 9.7 mg/dL (ref 8.7–10.3)
Chloride: 105 mmol/L (ref 96–106)
Creatinine, Ser: 0.69 mg/dL (ref 0.57–1.00)
Glucose: 92 mg/dL (ref 70–99)
Potassium: 4.3 mmol/L (ref 3.5–5.2)
Sodium: 140 mmol/L (ref 134–144)
eGFR: 91 mL/min/{1.73_m2} (ref >59)

## 2022-02-09 LAB — LIPID PANEL
Chol/HDL Ratio: 2 ratio (ref 0.0–4.4)
Cholesterol, Total: 157 mg/dL (ref 100–199)
HDL: 77 mg/dL (ref >39)
LDL Chol Calc (NIH): 61 mg/dL (ref 0–99)
Triglycerides: 105 mg/dL (ref 0–149)
VLDL Cholesterol Cal: 19 mg/dL (ref 5–40)

## 2022-02-09 LAB — PRO B NATRIURETIC PEPTIDE: NT-Pro BNP: 76 pg/mL (ref 0–301)

## 2022-02-20 ENCOUNTER — Other Ambulatory Visit: Payer: Medicare Other

## 2022-02-21 ENCOUNTER — Ambulatory Visit (HOSPITAL_COMMUNITY): Payer: Medicare Other | Attending: Cardiology

## 2022-02-21 DIAGNOSIS — Z79899 Other long term (current) drug therapy: Secondary | ICD-10-CM | POA: Diagnosis not present

## 2022-02-21 DIAGNOSIS — R600 Localized edema: Secondary | ICD-10-CM | POA: Diagnosis present

## 2022-02-22 LAB — ECHOCARDIOGRAM COMPLETE
Area-P 1/2: 4.1 cm2
S' Lateral: 2.8 cm

## 2022-02-27 ENCOUNTER — Encounter: Payer: Self-pay | Admitting: *Deleted

## 2022-03-16 ENCOUNTER — Encounter: Payer: Self-pay | Admitting: Cardiology

## 2022-03-16 ENCOUNTER — Telehealth: Payer: Self-pay | Admitting: Cardiology

## 2022-03-16 NOTE — Telephone Encounter (Signed)
Patient called to report she is staying at the [facility] and her tel# [phone], [phone]

## 2022-03-16 NOTE — Telephone Encounter (Signed)
error 

## 2022-03-16 NOTE — Telephone Encounter (Signed)
Meriam Sprague, MD 02/23/2022  8:37 AM EDT     Her echo looks great with normal pumping function and no significant valve disease.    The patient has been notified of the result and verbalized understanding.  All questions (if any) were answered. Loa Socks, LPN [id number]/06/9474 54:65 PM

## 2022-03-27 ENCOUNTER — Ambulatory Visit
Admission: RE | Admit: 2022-03-27 | Discharge: 2022-03-27 | Disposition: A | Discharge status: Home / Self Care | Payer: Medicare Other | Source: Ambulatory Visit | Attending: Orthopedic Surgery | Admitting: Orthopedic Surgery

## 2022-03-27 ENCOUNTER — Encounter: Payer: Self-pay | Admitting: Physician Assistant

## 2022-03-27 DIAGNOSIS — G8929 Other chronic pain: Secondary | ICD-10-CM

## 2022-03-28 NOTE — Progress Notes (Signed)
Cardiology looks good in terms of echo.  That her labs look good.  Can you call her and see if she wants to get her shoulder replaced and if so we will get her on the schedule.  Might be good to have her come in just I can look at her 1 more time and talk to her about it.

## 2022-04-04 ENCOUNTER — Telehealth: Payer: Self-pay

## 2022-04-04 NOTE — Telephone Encounter (Signed)
Second attempt made at calling patient to discuss Dr Alfonso Patten note. Will try to reach patient again at a later time.

## 2022-04-04 NOTE — Telephone Encounter (Signed)
-----   Message from Cammy Copa, MD sent at 03/28/2022 11:08 AM EST ----- Cardiology looks good in terms of echo.  That her labs look good.  Can you call her and see if she wants to get her shoulder replaced and if so we will get her on the schedule.  Might be good to have her come in just I can look at her 1 more time and  talk to her about it.

## 2022-04-05 NOTE — Telephone Encounter (Signed)
Tried reaching patient again unsuccessfully

## 2022-05-03 ENCOUNTER — Encounter: Payer: Self-pay | Admitting: *Deleted

## 2022-05-09 ENCOUNTER — Encounter: Payer: Self-pay | Admitting: Physician Assistant

## 2022-05-10 ENCOUNTER — Ambulatory Visit: Payer: Medicare Other | Admitting: Physician Assistant

## 2022-05-12 ENCOUNTER — Ambulatory Visit: Payer: Medicare Other | Admitting: Physician Assistant

## 2022-05-25 ENCOUNTER — Ambulatory Visit: Payer: Medicare Other | Admitting: Orthopedic Surgery

## 2022-05-31 ENCOUNTER — Ambulatory Visit (INDEPENDENT_AMBULATORY_CARE_PROVIDER_SITE_OTHER): Payer: 59 | Admitting: Physician Assistant

## 2022-05-31 ENCOUNTER — Other Ambulatory Visit (INDEPENDENT_AMBULATORY_CARE_PROVIDER_SITE_OTHER): Payer: 59

## 2022-05-31 ENCOUNTER — Encounter: Payer: Self-pay | Admitting: Physician Assistant

## 2022-05-31 ENCOUNTER — Telehealth: Payer: Self-pay

## 2022-05-31 VITALS — BP 140/90 | HR 83 | Ht 61.0 in | Wt 174.0 lb

## 2022-05-31 DIAGNOSIS — R194 Change in bowel habit: Secondary | ICD-10-CM

## 2022-05-31 DIAGNOSIS — R1084 Generalized abdominal pain: Secondary | ICD-10-CM

## 2022-05-31 LAB — COMPREHENSIVE METABOLIC PANEL
ALT: 11 U/L (ref 0–35)
AST: 11 U/L (ref 0–37)
Albumin: 4.3 g/dL (ref 3.5–5.2)
Alkaline Phosphatase: 77 U/L (ref 39–117)
BUN: 12 mg/dL (ref 6–23)
CO2: 27 mEq/L (ref 19–32)
Calcium: 9.4 mg/dL (ref 8.4–10.5)
Chloride: 108 mEq/L (ref 96–112)
Creatinine, Ser: 0.75 mg/dL (ref 0.40–1.20)
GFR: 78.07 mL/min (ref >60.00)
Glucose, Bld: 102 mg/dL — ABNORMAL HIGH (ref 70–99)
Potassium: 3.9 mEq/L (ref 3.5–5.1)
Sodium: 141 mEq/L (ref 135–145)
Total Bilirubin: 0.5 mg/dL (ref 0.2–1.2)
Total Protein: 7.4 g/dL (ref 6.0–8.3)

## 2022-05-31 LAB — CBC WITH DIFFERENTIAL/PLATELET
Basophils Absolute: 0.1 10*3/uL (ref 0.0–0.1)
Basophils Relative: 0.8 % (ref 0.0–3.0)
Eosinophils Absolute: 0.1 10*3/uL (ref 0.0–0.7)
Eosinophils Relative: 1.6 % (ref 0.0–5.0)
HCT: 37.3 % (ref 36.0–46.0)
Hemoglobin: 13.2 g/dL (ref 12.0–15.0)
Lymphocytes Relative: 52.1 % — ABNORMAL HIGH (ref 12.0–46.0)
Lymphs Abs: 3.5 10*3/uL (ref 0.7–4.0)
MCHC: 35.5 g/dL (ref 30.0–36.0)
MCV: 97.8 fl (ref 78.0–100.0)
Monocytes Absolute: 0.3 10*3/uL (ref 0.1–1.0)
Monocytes Relative: 5.1 % (ref 3.0–12.0)
Neutro Abs: 2.7 10*3/uL (ref 1.4–7.7)
Neutrophils Relative %: 40.4 % — ABNORMAL LOW (ref 43.0–77.0)
Platelets: 275 10*3/uL (ref 150.0–400.0)
RBC: 3.82 Mil/uL — ABNORMAL LOW (ref 3.87–5.11)
RDW: 14.8 % (ref 11.5–15.5)
WBC: 6.7 10*3/uL (ref 4.0–10.5)

## 2022-05-31 NOTE — Telephone Encounter (Signed)
Contacted patient and left voicemail to return call regarding lab results.

## 2022-05-31 NOTE — Progress Notes (Signed)
Chief Complaint: Change in bowel habits  HPI:    [name] is a 77 year old African-American female with a past medical history as listed below including CAD, diabetes, GERD and rheumatoid arthritis, previously known to Dr. Russella Dar, who was referred to me by Jackie Plum, MD for a complaint of change in bowel habits.      03/10/2002 colonoscopy with Dr. Arnaldo Natal was normal.  At that time being performed for abdominal pain and bloating and a change in bowel habits.  Discussed that her symptoms are likely functional/psychological and that her exams were normal.  She had an abundance of complaints and was concerned that her body odor was related to GI dysfunction and that her feces were shaped like animals, she had previously been evaluated by Dr. Joycelyn Rua and [facility] without diagnosis.    02/03/2019 CTAP with contrast showed no acute findings in the abdomen or pelvis mild colonic diverticulosis.    02/08/2022 BMP normal.    Today, patient presents to clinic with multiple complaints.  Her primary complaint is that she was previously diagnosed with "gastritis in my legs", tells me this diagnosis was a year ago and since then she has not been able to walk (though she is walking in the exam room and able to move over to the bench), she then goes on to tell me that anytime she eats she has a lot of gas and that her "feces are not normal and look like balls or they are long", apparently that has been going on for a year.  Also tells me that for the past 7 to 8 months she feels like she has seen some feces from her vagina (though cannot tell me which way she wipes after having a bowel movement), no UTI symptoms.  Tells me this happens about once a month.  She then tells me that she cannot lay on white sheets because there is urine that comes out of the "sides of my abdomen".  Due to this she lays on a towel in the bed.  Tells me she can drink a glass of water and then lay down in bed and there is "liquid  there".  Does also describe generalized abdominal pain and change in appetite.    Denies fever, chills, nausea or vomiting.  Past Medical History:  Diagnosis Date   Allergy    Arthritis    Asthma    CAD (coronary artery disease)    Chronic headaches    Depression    Diabetes mellitus, type II (HCC)    Diverticulosis    Endometriosis    GERD (gastroesophageal reflux disease)    Heart attack (HCC)    High cholesterol    Hypertension    Mallory-Weiss tear 2003   Mood swings    Rheumatoid arthritis (HCC)    Vitamin D deficiency     Past Surgical History:  Procedure Laterality Date   ABDOMINAL HYSTERECTOMY     EXPLORATORY LAPAROTOMY     I & D EXTREMITY Left 08/04/2017   Procedure: IRRIGATION AND DEBRIDEMENT HAND;  Surgeon: Betha Loa, MD;  Location: [facility] OR;  Service: Orthopedics;  Laterality: Left;   LEFT HEART CATHETERIZATION WITH CORONARY ANGIOGRAM N/A 04/10/2013   Procedure: LEFT HEART CATHETERIZATION WITH CORONARY ANGIOGRAM;  Surgeon: Ricki Rodriguez, MD;  Location: [facility] CATH LAB;  Service: Cardiovascular;  Laterality: N/A;    Current Outpatient Medications  Medication Sig Dispense Refill   acetaminophen-codeine (TYLENOL #3) 300-30 MG tablet Take 1 tablet by mouth every  6 (six) hours as needed for moderate pain. 21 tablet 0   albuterol (PROVENTIL) (2.5 MG/3ML) 0.083% nebulizer solution Use one vial in the nebulizer every 4-6 hours if needed for coughing, shortness of breath, or wheezing. 300 mL 1   albuterol (VENTOLIN HFA) 108 (90 Base) MCG/ACT inhaler INHALE 2 PUFFS BY MOUTH EVERY 4 HOURS AS NEEDED FOR WHEEZING OR SHORTNESS OF BREATH. PLEASE KEEP SCHEDULED OFFICE VISIT FOR FURTHER REFILLS. (Patient taking differently: Inhale 2 puffs into the lungs every 4 (four) hours as needed for wheezing or shortness of breath.) 18 g 1   amLODipine (NORVASC) 2.5 MG tablet Take 2.5 mg by mouth daily.  1   buPROPion (WELLBUTRIN XL) 150 MG 24 hr tablet Take 150 mg by mouth every morning.      esomeprazole (NEXIUM) 40 MG capsule Take 1 capsule (40 mg total) by mouth daily at 12 noon. 30 capsule 5   fluticasone (FLONASE) 50 MCG/ACT nasal spray Place 2 sprays into both nostrils daily. 48 g 1   gabapentin (NEURONTIN) 400 MG capsule Take 400 mg by mouth 3 (three) times daily.     haloperidol (HALDOL) 20 MG tablet Take 20 mg by mouth at bedtime.     lidocaine (LIDODERM) 5 % Place 1 patch onto the skin daily as needed. Apply patch to area most significant pain once per day.  Remove and discard patch within 12 hours of application. 15 patch 0   metoprolol succinate (TOPROL-XL) 25 MG 24 hr tablet Take 25 mg by mouth daily.  3   nitroGLYCERIN (NITROSTAT) 0.3 MG SL tablet Place 0.3 mg under the tongue every 5 (five) minutes as needed for chest pain.     Vitamin D, Ergocalciferol, (DRISDOL) 1.25 MG (50000 UNIT) CAPS capsule Take 50,000 Units by mouth once a week.     meclizine (ANTIVERT) 12.5 MG tablet Take 1 tablet (12.5 mg total) by mouth 3 (three) times daily as needed for dizziness. (Patient not taking: Reported on 05/31/2022) 30 tablet 0   Tiotropium Bromide Monohydrate (SPIRIVA RESPIMAT) 1.25 MCG/ACT AERS Inhale 2 puffs into the lungs daily. (Patient not taking: Reported on 05/31/2022) 16 g 1   umeclidinium bromide (INCRUSE ELLIPTA) 62.5 MCG/INH AEPB Inhale 1 puff into the lungs daily. (Patient not taking: Reported on 05/31/2022) 90 each 1   No current facility-administered medications for this visit.    Allergies as of 05/31/2022   (No Known Allergies)    Family History  Problem Relation Age of Onset   Cancer Mother    Heart disease Mother    Cancer Father    Heart disease Father     Social History   Socioeconomic History   Marital status: Divorced    Spouse name: Not on file   Number of children: Not on file   Years of education: Not on file   Highest education level: Not on file  Occupational History   Not on file  Tobacco Use   Smoking status: Former    Types: Cigarettes    Smokeless tobacco: Never  Vaping Use   Vaping Use: Never used  Substance and Sexual Activity   Alcohol use: Yes    Comment: occasional   Drug use: No   Sexual activity: Never  Other Topics Concern   Not on file  Social History Narrative   Not on file   Social Determinants of Health   Financial Resource Strain: Not on file  Food Insecurity: Not on file  Transportation Needs: Not on file  Physical Activity: Not on file  Stress: Not on file  Social Connections: Not on file  Intimate Partner Violence: Not on file    Review of Systems:    Constitutional: No weight loss, fever or chills Skin: No rash  Cardiovascular: No chest pain Respiratory: No SOB  Gastrointestinal: See HPI and otherwise negative Genitourinary: No dysuria  Neurological: No headache, dizziness or syncope Musculoskeletal: No new muscle or joint pain Hematologic: No bleeding  Psychiatric: +depression   Physical Exam:  Vital signs: BP (!) 140/90   Pulse 83   Ht 5\' 1"  (1.549 m)   Wt 174 lb (78.9 kg)   BMI 32.88 kg/m    Constitutional:   AA female appears to be in NAD, Well developed, Well nourished, alert and cooperative Head:  Normocephalic and atraumatic. Eyes:   PEERL, EOMI. No icterus. Conjunctiva pink. Ears:  Normal auditory acuity. Neck:  Supple Throat: Oral cavity and pharynx without inflammation, swelling or lesion.  Respiratory: Respirations even and unlabored. Lungs clear to auscultation bilaterally.   No wheezes, crackles, or rhonchi.  Cardiovascular: Normal S1, S2. No MRG. Regular rate and rhythm. No peripheral edema, cyanosis or pallor.  Gastrointestinal:  Soft, nondistended, mild generalized TTP. No rebound or guarding. Normal bowel sounds. No appreciable masses or hepatomegaly. Rectal:  Not performed.  Msk:  Symmetrical without gross deformities. Without edema, no deformity or joint abnormality.  Neurologic:  Alert and  oriented x4;  grossly normal neurologically.  Skin:   Dry and  intact without significant lesions or rashes. Psychiatric:  + Altered judgment and hallucinatory thoughts  No recent labs.  Assessment: 1.  Generalized abdominal pain: Vague complaints; uncertain etiology 2.  Change in bowel habits: Describes a change in shape and sometimes color, this has been a complaint in the past (see last colonoscopy), thought to have some psychological issues at that time, likely the same  Plan: 1.  Patient has multiple complaints which are irrational, I did take the time to try and explain to her that some of them are just not possible.  She does have question of "feces from her vagina", I am not sure how accurate this is given the rest of her history.  I do see Haldol on her med list but I cannot find that she follows with a psychologist or psychiatrist.  Additionally no mental history listed.  She does have a PCP and I will make sure that he gets CCed on this note to follow-up.  I am more concerned about her mental health at this point than anything else. 2.  Martin Majestic ahead and ordered a CT of the abdomen pelvis today given vague complaints.  Labs as well including CBC and CMP. 3.  If patient is legitimately having a change in bowel habits and continues with these complaints could consider a colonoscopy in the future given that has been over 10 years since her last one and she would be due for one more screening. 4.  Patient to follow in clinic per recommendations after CT and labs above.  I would suggest that she see her PCP prior to follow-up with [place] though to discuss some of her complaints and concerns.  Ellouise Newer, PA-C [facility] 05/31/2022, 11:20 AM  Cc: Benito Mccreedy, MD

## 2022-05-31 NOTE — Patient Instructions (Signed)
You have been scheduled for a CT scan of the abdomen and pelvis at [facility], 1st floor Radiology. You are scheduled on 06/08/22 at 5:30 pm. You should arrive 30 minutes prior to your appointment time for registration. You can check in through the Emergency department. Make sure you have nothing to eat or drink 4 hours prior to your test (1:30 pm).  You may take any medications as prescribed with a small amount of water, if necessary. If you take any of the following medications: METFORMIN, GLUCOPHAGE, [place], AVANDAMET, RIOMET, FORTAMET, [place] MET, JANUMET, GLUMETZA or METAGLIP, you MAY be asked to HOLD this medication 48 hours AFTER the exam.   If you have any questions regarding your exam or if you need to reschedule, you may call [facility] at [phone] between the hours of 8:00 am and 5:00 pm, Monday-Friday.   Your provider has requested that you go to the basement level for lab work before leaving today. Press "B" on the elevator. The lab is located at the first door on the left as you exit the elevator.  Due to recent changes in healthcare laws, you may see the results of your imaging and laboratory studies on MyChart before your provider has had a chance to review them.  We understand that in some cases there may be results that are confusing or concerning to you. Not all laboratory results come back in the same time frame and the provider may be waiting for multiple results in order to interpret others.  Please give [place] 48 hours in order for your provider to thoroughly review all the results before contacting the office for clarification of your results.    It was a pleasure to see you today!  Thank you for trusting me with your gastrointestinal care!

## 2022-06-05 ENCOUNTER — Ambulatory Visit (INDEPENDENT_AMBULATORY_CARE_PROVIDER_SITE_OTHER): Payer: 59 | Admitting: Orthopedic Surgery

## 2022-06-05 DIAGNOSIS — M19011 Primary osteoarthritis, right shoulder: Secondary | ICD-10-CM | POA: Diagnosis not present

## 2022-06-05 MED ORDER — TRAMADOL HCL 50 MG PO TABS: 50.0000 mg | ORAL_TABLET | Freq: Three times a day (TID) | ORAL | 0 refills | Status: DC | PRN

## 2022-06-06 NOTE — Progress Notes (Unsigned)
Office Visit Note   Patient: [name]           Date of Birth: 1945-05-20           MRN: [id number] Visit Date: 06/05/2022 Requested by: Benito Mccreedy, MD [address] [place],  [place] [address] PCP: Benito Mccreedy, MD  Subjective: Chief Complaint  Patient presents with   Right Shoulder - Pain    HPI: [name] is a 77 y.o. female who presents to the office reporting right shoulder pain.  Had a CT scan that was done 1123.  This scan shows severe osteoarthritis of the glenohumeral joint with large joint effusion.  She has had an echo which looks intact.  She has had recent chest pain and heart racing.  EKG is pending for today.  The pain wakes her from sleep at night.  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  Has a history of prior DVT but currently is not on blood thinners.  Sharyn Lull with ACT team is present today.  Patient lives alone..                ROS: All systems reviewed are negative as they relate to the chief complaint within the history of present illness.  Patient denies fevers or chills.  Assessment & Plan: Visit Diagnoses:  1. Arthritis of right shoulder region     Plan: Impression is severe right shoulder arthritis.  Patient has multiple medical comorbidities as well as social barriers to care following shoulder replacement.  She needs cardiology referral for restratification with history of recent chest pain and irregular rhythm.  Tramadol prescribed for pain.  Rehab would be required for her after surgery.  Once her cardiology restratification is complete we could potentially get her set up for shoulder replacement.  Follow-up after the cardiology evaluation  Follow-Up Instructions: No follow-ups on file.   Orders:  No orders of the defined types were placed in this encounter.  Meds ordered this encounter  Medications   traMADol (ULTRAM) 50 MG tablet    Sig: Take 1 tablet (50 mg total) by mouth every 8 (eight) hours as needed.     Dispense:  30 tablet    Refill:  0      Procedures: No procedures performed   Clinical Data: No additional findings.  Objective: Vital Signs: There were no vitals taken for this visit.  Physical Exam:  Constitutional: Patient appears well-developed HEENT:  Head: Normocephalic Eyes:EOM are normal Neck: Normal range of motion Cardiovascular: Normal rate Pulmonary/chest: Effort normal Neurologic: Patient is alert Skin: Skin is warm Psychiatric: Patient has normal mood and affect  Ortho Exam: Ortho exam demonstrates functional deltoid on the right-hand side.  Range of motion is limited and consistent with her prior exam.  Motor or sensory function of the hand is intact.  Cervical spine range of motion intact.  Radial pulse 2+ out of 4 on the right.  Specialty Comments:  No specialty comments available.  Imaging: No results found.   PMFS History: Patient Active Problem List   Diagnosis Date Noted   Moderate persistent asthma with acute exacerbation 12/05/2017   Perennial allergic rhinitis 09/20/2017   MDD (major depressive disorder) 07/31/2017   Insomnia 02/23/2015   OSA (obstructive sleep apnea) 02/23/2015   Asthma 02/23/2015   Chest pain on exertion 04/10/2013   Menopausal hot flushes 01/22/2013   Frequency of urination 01/22/2013   Past Medical History:  Diagnosis Date   Allergy  Arthritis    Asthma    CAD (coronary artery disease)    Chronic headaches    Depression    Diabetes mellitus, type II (HCC)    Diverticulosis    Endometriosis    GERD (gastroesophageal reflux disease)    Heart attack (HCC)    High cholesterol    Hypertension    Mallory-Weiss tear 2003   Mood swings    Rheumatoid arthritis (HCC)    Vitamin D deficiency     Family History  Problem Relation Age of Onset   Cancer Mother    Heart disease Mother    Cancer Father    Heart disease Father     Past Surgical History:  Procedure Laterality Date   ABDOMINAL HYSTERECTOMY      EXPLORATORY LAPAROTOMY     I & D EXTREMITY Left 08/04/2017   Procedure: IRRIGATION AND DEBRIDEMENT HAND;  Surgeon: Leanora Cover, MD;  Location: [facility];  Service: Orthopedics;  Laterality: Left;   LEFT HEART CATHETERIZATION WITH CORONARY ANGIOGRAM N/A 04/10/2013   Procedure: LEFT HEART CATHETERIZATION WITH CORONARY ANGIOGRAM;  Surgeon: Birdie Riddle, MD;  Location: [facility] CATH LAB;  Service: Cardiovascular;  Laterality: N/A;   Social History   Occupational History   Not on file  Tobacco Use   Smoking status: Former    Types: Cigarettes   Smokeless tobacco: Never  Vaping Use   Vaping Use: Never used  Substance and Sexual Activity   Alcohol use: Yes    Comment: occasional   Drug use: No   Sexual activity: Never

## 2022-06-07 ENCOUNTER — Telehealth: Payer: Self-pay | Admitting: Cardiology

## 2022-06-07 NOTE — Telephone Encounter (Signed)
Left voicemail message to call the clinic. 

## 2022-06-07 NOTE — Telephone Encounter (Signed)
Pt c/o of Chest Pain: STAT if CP now or developed within 24 hours  1. Are you having CP right now? no  2. Are you experiencing any other symptoms (ex. SOB, nausea, vomiting, sweating)? Nausea and sweating  3. How long have you been experiencing CP? A week  4. Is your CP continuous or coming and going? Comes and goes  5. Have you taken Nitroglycerin? no  Patient states she has been having chest pain off and on. She says she also says last night her heart was racing, but she does not know what the HR reading was. Phone: [phone] room 115  ?

## 2022-06-08 ENCOUNTER — Ambulatory Visit (HOSPITAL_COMMUNITY)
Admission: RE | Admit: 2022-06-08 | Discharge: 2022-06-08 | Disposition: A | Discharge status: Home / Self Care | Payer: 59 | Source: Ambulatory Visit | Attending: Physician Assistant | Admitting: Physician Assistant

## 2022-06-08 ENCOUNTER — Encounter: Payer: Self-pay | Admitting: Orthopedic Surgery

## 2022-06-08 DIAGNOSIS — R1084 Generalized abdominal pain: Secondary | ICD-10-CM | POA: Diagnosis present

## 2022-06-08 DIAGNOSIS — R194 Change in bowel habit: Secondary | ICD-10-CM | POA: Diagnosis present

## 2022-06-08 MED ORDER — IOHEXOL 300 MG/ML  SOLN
100.0000 mL | Freq: Once | INTRAMUSCULAR | Status: AC | PRN
  Administered 2022-06-08: 100 mL via INTRAVENOUS

## 2022-06-08 NOTE — Telephone Encounter (Signed)
Returned call to patient who states she's been having chest pains "for a couple of weeks and a racing heart." States the pain is not brought on with exertion, occurs at rest, and happens every 2-3 days. Chest is sore to touch. The pain she states is sharp and associated with feelings of pressure. Endorses nausea, no vomiting. Increased thirst for 2 weeks, she is not sure what to attribute to that. States yesterday she felt "like scolding hot water was going through my body starting at my chest and going all through my body." "I've been smelling and tasting blood." No SOB over baseline, states asthma has been fine. Pain yesterday occurred about 4pm. She states she usually eats two meals a day-breakfast b/w 5-7am, and lunch about 11am. States she doesn't eat much the rest of the day, may snack on peanut butter and crackers. She denied taking her Nexium-states she is going today to pick it up. States yesterday her BP was 130/105 after her am BP med. She did not recheck at all yesterday. BP at time of call is 115/136mmhg , states she has already had breakfast and taken Amlodipine 2.5mg  and Metoprol Succ 25mg . She states that her diastolic pressure "is always high" and that two days ago at another office, it was 157mmhg. I cannot verify this via chart. Advised her to get back on her Nexium as soon as she can, to alternate heat and ice compresses to her chest/arm area, and even use muscle rub as needed. The chest wall pain doesn't sound like this is an acute event and the burning may be due to lack of nexium. Will forward to MD for review and to advise on diastolic BP greater than 762.

## 2022-06-08 NOTE — Telephone Encounter (Signed)
Freada Bergeron, MD  You2 hours ago (10:29 AM)   HP Completely agree with you and this sounds more like GERD/reflux than cardiac. Maybe some of the elevated blood pressures are due to feeling so uncomfortable as well. If she is finding her diastolic blood pressure is consistently running high, we can get her to come in and see a APP for BP check and to bring her cuff to ensure they correlate.   Returned call to patient and spoke with her regarding above information. No further questions.

## 2022-06-14 ENCOUNTER — Telehealth: Payer: Self-pay | Admitting: *Deleted

## 2022-06-14 NOTE — Telephone Encounter (Signed)
   Pre-operative Risk Assessment    Patient Name: [name]  DOB: [birth year] MRN: [id number]     Request for Surgical Clearance    Procedure:   PT IS HAVING ONE DENTAL FILLING AT THIS TIME ; PT WILL BE REFERRED OUT TO ORAL SURGEON FOR EXTRACTIONS; WE WILL WAIT TO HEAR FROM ORAL SURGEON FOR CLEARANCE ON EXTRACTIONS  Date of Surgery:  Clearance TBD                                 Surgeon:  DR. Carlis Abbott, DDS Surgeon's Group or Practice Name:  [facility]  Phone number:  [phone] Fax number:  [phone]   Type of Clearance Requested:   - Medical ; NO MEDICATIONS ARE LISTED AS NEEDING TO BE HELD   Type of Anesthesia:  Local    Additional requests/questions:    Jiles Prows   06/14/2022, 11:53 AM

## 2022-06-14 NOTE — Telephone Encounter (Signed)
   Patient Name: [name]  DOB: 1945-05-22 MRN: [id number]  Primary Cardiologist: Freada Bergeron, MD  Chart reviewed as part of pre-operative protocol coverage.   Simple dental extractions (i.e. 1-2 teeth), fillings, and cleanings are considered low risk procedures per guidelines and generally do not require any specific cardiac clearance. It is also generally accepted that for simple extractions and dental cleanings, there is no need to interrupt blood thinner therapy.   SBE prophylaxis is not required for the patient from a cardiac standpoint.  I will route this recommendation to the requesting party via Epic fax function and remove from pre-op pool.  Please call with questions.  Lenna Sciara, NP 06/14/2022, 12:04 PM

## 2022-07-02 NOTE — Progress Notes (Unsigned)
Cardiology Office Note:    Date:  07/02/2022   ID:  [name], DOB [birth year], MRN [id number]  PCP:  Benito Mccreedy, MD   [place] Providers Cardiologist:  Freada Bergeron, MD { Click to update primary MD,subspecialty MD or APP then REFRESH:1}    Referring MD: Benito Mccreedy, MD   No chief complaint on file. ***  History of Present Illness:    [name] is a 77 y.o. female with a hx of OSA, hypertension, asthma, insomnia, depression, chest pain on exertion, rheumatoid arthritis.  She presented to [facility] in August 2023 for complaints of right shoulder pain, however she was having a lot of vague medical complaints, including chest pain and bilateral leg swelling.  She was referred to [facility] for further evaluation.   Initially referred to [facility] and evaluated by Dr. Johney Frame on 02/08/2022 for concerns of chest pain.  Patient reported she had an MI 6 years previously, cath report from 2014 was reviewed with no significant coronary disease.  On this visit, she was complaining of generalized and widespread pain, secondary to her rheumatoid arthritis.  She was also having some pedal edema.  CBC, BMET, BNP were ordered and were all within normal limits.  An echocardiogram was ordered and revealed an EF of 55 to 60%, trivial MR.  She reached out to our triage line on 06/08/2022 with concerns of elevated blood pressure reading 130/105, and complaints of smelling and tasting blood.  She had some complaints of chest pain but did not appear to be cardiac in nature.  She presents today for follow-up  Coronary CTA  Past Medical History:  Diagnosis Date   Allergy    Arthritis    Asthma    CAD (coronary artery disease)    Chronic headaches    Depression    Diabetes mellitus, type II ([place])    Diverticulosis    Endometriosis    GERD (gastroesophageal reflux disease)    Heart attack ([place])    High cholesterol    Hypertension    Mallory-Weiss tear  2003   Mood swings    Rheumatoid arthritis ([place])    Vitamin D deficiency     Past Surgical History:  Procedure Laterality Date   ABDOMINAL HYSTERECTOMY     EXPLORATORY LAPAROTOMY     I & D EXTREMITY Left 08/04/2017   Procedure: IRRIGATION AND DEBRIDEMENT HAND;  Surgeon: Leanora Cover, MD;  Location: [place];  Service: Orthopedics;  Laterality: Left;   LEFT HEART CATHETERIZATION WITH CORONARY ANGIOGRAM N/A 04/10/2013   Procedure: LEFT HEART CATHETERIZATION WITH CORONARY ANGIOGRAM;  Surgeon: Birdie Riddle, MD;  Location: [place] CATH [facility];  Service: Cardiovascular;  Laterality: N/A;    Current Medications: No outpatient medications have been marked as taking for the 07/03/22 encounter (Appointment) with Loel Dubonnet, NP.     Allergies:   Patient has no known allergies.   Social History   Socioeconomic History   Marital status: Divorced    Spouse name: Not on file   Number of children: Not on file   Years of education: Not on file   Highest education level: Not on file  Occupational History   Not on file  Tobacco Use   Smoking status: Former    Types: Cigarettes   Smokeless tobacco: Never  Vaping Use   Vaping Use: Never used  Substance and Sexual Activity   Alcohol use: Yes    Comment: occasional   Drug use: No   Sexual  activity: Never  Other Topics Concern   Not on file  Social History Narrative   Not on file   Social Determinants of Health   Financial Resource Strain: Not on file  Food Insecurity: Not on file  Transportation Needs: Not on file  Physical Activity: Not on file  Stress: Not on file  Social Connections: Not on file     Family History: The patient's ***family history includes Cancer in her father and mother; Heart disease in her father and mother.  ROS:   Please see the history of present illness.    *** All other systems reviewed and are negative.  EKGs/Labs/Other Studies Reviewed:    The following studies were reviewed today:  ABI Doppler   01/11/2022: Summary:  Right: Resting right ankle-brachial index indicates noncompressible right  lower extremity arteries. The right toe-brachial index is normal.   Left: Resting left ankle-brachial index indicates noncompressible left  lower extremity arteries. The left toe-brachial index is normal.    TTE 2019: LVEF normal, normal LV size, G1DD, no significant valve disease.   EKG:  EKG is *** ordered today.  The ekg ordered today demonstrates ***  Recent Labs: 02/08/2022: NT-Pro BNP 76 05/31/2022: ALT 11; BUN 12; Creatinine, Ser 0.75; Hemoglobin 13.2; Platelets 275.0; Potassium 3.9; Sodium 141  Recent Lipid Panel    Component Value Date/Time   CHOL 157 02/08/2022 1021   TRIG 105 02/08/2022 1021   HDL 77 02/08/2022 1021   CHOLHDL 2.0 02/08/2022 1021   LDLCALC 61 02/08/2022 1021     Risk Assessment/Calculations:   {Does this patient have ATRIAL FIBRILLATION?:[id number]}  No BP recorded.  {Refresh Note OR Click here to enter BP  :1}***         Physical Exam:    VS:  There were no vitals taken for this visit.    Wt Readings from Last 3 Encounters:  05/31/22 174 lb (78.9 kg)  02/08/22 168 lb 9.6 oz (76.5 kg)  11/09/20 164 lb 12.8 oz (74.8 kg)     GEN: *** Well nourished, well developed in no acute distress HEENT: Normal NECK: No JVD; No carotid bruits LYMPHATICS: No lymphadenopathy CARDIAC: ***RRR, no murmurs, rubs, gallops RESPIRATORY:  Clear to auscultation without rales, wheezing or rhonchi  ABDOMEN: Soft, non-tender, non-distended MUSCULOSKELETAL:  No edema; No deformity  SKIN: Warm and dry NEUROLOGIC:  Alert and oriented x 3 PSYCHIATRIC:  Normal affect   ASSESSMENT:    1. Chest pain of uncertain etiology   2. Primary hypertension   3. Bilateral lower extremity edema   4. OSA (obstructive sleep apnea)    PLAN:    In order of problems listed above:  Chest pain -reported previous MI several years ago, cath report was reviewed by Dr. Johney Frame at office  visit in October 2023 without any significant urinary disease (these results are not available for my review today),  Hypertension -blood pressure today Lower extremity edema -venous ultrasound ABI 01/11/2022 was normal, echocardiogram on 02/22/2022 with normal EF and no valvular dysfunction OSA      {Are you ordering a CV Procedure (e.g. stress test, cath, DCCV, TEE, etc)?   Press F2        :[id number]    Medication Adjustments/Labs and Tests Ordered: Current medicines are reviewed at length with the patient today.  Concerns regarding medicines are outlined above.  No orders of the defined types were placed in this encounter.  No orders of the defined types were placed in this encounter.  There are no Patient Instructions on file for this visit.   Signed, Trudi Ida, NP  07/02/2022 1:12 PM    [place]

## 2022-07-03 ENCOUNTER — Other Ambulatory Visit (HOSPITAL_BASED_OUTPATIENT_CLINIC_OR_DEPARTMENT_OTHER): Payer: Self-pay

## 2022-07-03 ENCOUNTER — Encounter (HOSPITAL_BASED_OUTPATIENT_CLINIC_OR_DEPARTMENT_OTHER): Payer: Self-pay | Admitting: Cardiology

## 2022-07-03 ENCOUNTER — Ambulatory Visit (INDEPENDENT_AMBULATORY_CARE_PROVIDER_SITE_OTHER): Payer: 59 | Admitting: Cardiology

## 2022-07-03 VITALS — BP 136/86 | HR 79 | Ht 61.0 in | Wt 176.0 lb

## 2022-07-03 DIAGNOSIS — G4733 Obstructive sleep apnea (adult) (pediatric): Secondary | ICD-10-CM | POA: Diagnosis not present

## 2022-07-03 DIAGNOSIS — I10 Essential (primary) hypertension: Secondary | ICD-10-CM | POA: Diagnosis not present

## 2022-07-03 DIAGNOSIS — R600 Localized edema: Secondary | ICD-10-CM

## 2022-07-03 DIAGNOSIS — R0789 Other chest pain: Secondary | ICD-10-CM

## 2022-07-03 DIAGNOSIS — R079 Chest pain, unspecified: Secondary | ICD-10-CM | POA: Diagnosis not present

## 2022-07-03 DIAGNOSIS — M792 Neuralgia and neuritis, unspecified: Secondary | ICD-10-CM

## 2022-07-03 MED ORDER — METOPROLOL TARTRATE 100 MG PO TABS: 100.0000 mg | ORAL_TABLET | Freq: Once | ORAL | 0 refills | Status: DC

## 2022-07-03 MED ORDER — AMLODIPINE BESYLATE 2.5 MG PO TABS: 2.5000 mg | ORAL_TABLET | Freq: Every day | ORAL | 3 refills | Status: DC

## 2022-07-03 NOTE — Patient Instructions (Signed)
Medication Instructions:  Take Metoprolol '100mg'$  1 tablet 2 hrs before CT scan   Refilled Norvasc.    *If you need a refill on your cardiac medications before your next appointment, please call your pharmacy*   Lab Work: BMET, CBC, Hgb A1C, BNP  If you have labs (blood work) drawn today and your tests are completely normal, you will receive your results only by: [place] (if you have MyChart) OR A paper copy in the mail If you have any lab test that is abnormal or we need to change your treatment, we will call you to review the results.   Testing/Procedures:   Your cardiac CT will be scheduled at one of the below locations:   [place] [facility] [address] [place], [place] [address] [phone]   If scheduled at [facility], please arrive at the [facility] (Entrance C2) of [facility] 30 minutes prior to test start time. You can use the FREE valet parking offered at entrance C (encouraged to control the heart rate for the test)  Proceed to the [facility] Radiology Department (first floor) to check-in and test prep.  All radiology patients and guests should use entrance C2 at [facility], accessed from [facility], even though the hospital's physical address listed is [address].     Please follow these instructions carefully (unless otherwise directed):   On the Night Before the Test: Be sure to Drink plenty of water. Do not consume any caffeinated/decaffeinated beverages or chocolate 12 hours prior to your test. Do not take any antihistamines 12 hours prior to your test. If the patient has contrast allergy: Patient will need a prescription for Prednisone and very clear instructions (as follows): Prednisone 50 mg - take 13 hours prior to test Take another Prednisone 50 mg 7 hours prior to test Take another Prednisone 50 mg 1 hour prior to test Take Benadryl 50 mg 1 hour prior to  test Patient must complete all four doses of above prophylactic medications. Patient will need a ride after test due to Benadryl.  On the Day of the Test: Drink plenty of water until 1 hour prior to the test. Do not eat any food 1 hour prior to test. You may take your regular medications prior to the test.  Take metoprolol (Lopressor) two hours prior to test. If you take Furosemide/Hydrochlorothiazide/Spironolactone, please HOLD on the morning of the test. FEMALES- please wear underwire-free bra if available, avoid dresses & tight clothing   *For Clinical Staff only. Please instruct patient the following:* Heart Rate Medication Recommendations for Cardiac CT  Resting HR < 50 bpm  No medication  Resting HR 50-60 bpm and BP >110/50 mmHG   Consider Metoprolol tartrate 25 mg PO 90-120 min prior to scan  Resting HR 60-65 bpm and BP >110/50 mmHG  Metoprolol tartrate 50 mg PO 90-120 minutes prior to scan   Resting HR > 65 bpm and BP >110/50 mmHG  Metoprolol tartrate 100 mg PO 90-120 minutes prior to scan  Consider Ivabradine 10-15 mg PO or a calcium channel blocker for resting HR >60 bpm and contraindication to metoprolol tartrate  Consider Ivabradine 10-15 mg PO in combination with metoprolol tartrate for HR >80 bpm         After the Test: Drink plenty of water. After receiving IV contrast, you may experience a mild flushed feeling. This is normal. On occasion, you may experience a mild rash up to 24  hours after the test. This is not dangerous. If this occurs, you can take Benadryl 25 mg and increase your fluid intake. If you experience trouble breathing, this can be serious. If it is severe call 911 IMMEDIATELY. If it is mild, please call our office. If you take any of these medications: Glipizide/Metformin, Avandament, Glucavance, please do not take 48 hours after completing test unless otherwise instructed.  We will call to schedule your test 2-4 weeks out understanding that some  insurance companies will need an authorization prior to the service being performed.   For non-scheduling related questions, please contact the cardiac imaging nurse navigator should you have any questions/concerns: Marchia Bond, Cardiac Imaging Nurse Navigator Gordy Clement, Cardiac Imaging Nurse Navigator [facility] Direct Office Dial: [phone]   For scheduling needs, including cancellations and rescheduling, please call [place], [phone].    Follow-Up: At [facility], you and your health needs are our priority.  As part of our continuing mission to provide you with exceptional heart care, we have created designated Provider Care Teams.  These Care Teams include your primary Cardiologist (physician) and Advanced Practice Providers (APPs -  Physician Assistants and Nurse Practitioners) who all work together to provide you with the care you need, when you need it.  We recommend signing up for the patient portal called "MyChart".  Sign up information is provided on this After Visit Summary.  MyChart is used to connect with patients for Virtual Visits (Telemedicine).  Patients are able to view lab/test results, encounter notes, upcoming appointments, etc.  Non-urgent messages can be sent to your provider as well.   To learn more about what you can do with MyChart, go to [email or web address].    Your next appointment:   4 week(s)  Laurann Montana NP    Other Instructions None

## 2022-07-04 ENCOUNTER — Encounter (HOSPITAL_BASED_OUTPATIENT_CLINIC_OR_DEPARTMENT_OTHER): Payer: Self-pay

## 2022-07-04 ENCOUNTER — Telehealth (HOSPITAL_BASED_OUTPATIENT_CLINIC_OR_DEPARTMENT_OTHER): Payer: Self-pay

## 2022-07-04 LAB — BASIC METABOLIC PANEL
BUN/Creatinine Ratio: 17 (ref 12–28)
BUN: 16 mg/dL (ref 8–27)
CO2: 17 mmol/L — ABNORMAL LOW (ref 20–29)
Calcium: 9.7 mg/dL (ref 8.7–10.3)
Chloride: 105 mmol/L (ref 96–106)
Creatinine, Ser: 0.93 mg/dL (ref 0.57–1.00)
Glucose: 95 mg/dL (ref 70–99)
Potassium: 4.4 mmol/L (ref 3.5–5.2)
Sodium: 141 mmol/L (ref 134–144)
eGFR: 64 mL/min/{1.73_m2} (ref >59)

## 2022-07-04 LAB — CBC
Hematocrit: 38.5 % (ref 34.0–46.6)
Hemoglobin: 13.5 g/dL (ref 11.1–15.9)
MCH: 34.6 pg — ABNORMAL HIGH (ref 26.6–33.0)
MCHC: 35.1 g/dL (ref 31.5–35.7)
MCV: 99 fL — ABNORMAL HIGH (ref 79–97)
Platelets: 284 10*3/uL (ref 150–450)
RBC: 3.9 x10E6/uL (ref 3.77–5.28)
RDW: 13.8 % (ref 11.7–15.4)
WBC: 4.8 10*3/uL (ref 3.4–10.8)

## 2022-07-04 LAB — BRAIN NATRIURETIC PEPTIDE: BNP: 48 pg/mL (ref 0.0–100.0)

## 2022-07-04 LAB — HEMOGLOBIN A1C
Est. average glucose Bld gHb Est-mCnc: 105 mg/dL
Hgb A1c MFr Bld: 5.3 % (ref 4.8–5.6)

## 2022-07-04 NOTE — Telephone Encounter (Addendum)
Reached out to both numbers on file, one is invalid and the mobile number is a business that does not have contact with the patient. No DPR to contact other number on file, will mail results to patient.     ----- Message from Loel Dubonnet, NP sent at 07/04/2022  3:46 PM EST ----- CBC with no evidence of anemia nor infection.  Stable kidney function. Normal electrolytes. No volume overload. A1c normal. Good result!

## 2022-07-07 ENCOUNTER — Telehealth (HOSPITAL_COMMUNITY): Payer: Self-pay | Admitting: Emergency Medicine

## 2022-07-07 NOTE — Telephone Encounter (Signed)
Unable to leave vm Jilliane Kazanjian RN Navigator Cardiac Imaging  Heart and Vascular Services [phone] Office  [phone] Cell  

## 2022-07-11 ENCOUNTER — Ambulatory Visit (HOSPITAL_COMMUNITY)
Admission: RE | Admit: 2022-07-11 | Discharge: 2022-07-11 | Disposition: A | Discharge status: Home / Self Care | Payer: 59 | Source: Ambulatory Visit | Attending: Family | Admitting: Family

## 2022-07-11 ENCOUNTER — Telehealth (HOSPITAL_BASED_OUTPATIENT_CLINIC_OR_DEPARTMENT_OTHER): Payer: Self-pay

## 2022-07-11 DIAGNOSIS — I2510 Atherosclerotic heart disease of native coronary artery without angina pectoris: Secondary | ICD-10-CM | POA: Diagnosis not present

## 2022-07-11 DIAGNOSIS — R0789 Other chest pain: Secondary | ICD-10-CM | POA: Diagnosis present

## 2022-07-11 DIAGNOSIS — Z79899 Other long term (current) drug therapy: Secondary | ICD-10-CM

## 2022-07-11 DIAGNOSIS — R079 Chest pain, unspecified: Secondary | ICD-10-CM | POA: Diagnosis present

## 2022-07-11 MED ORDER — METOPROLOL TARTRATE 5 MG/5ML IV SOLN
INTRAVENOUS | Status: AC
  Administered 2022-07-11: 10 mg via INTRAVENOUS
  Filled 2022-07-11: qty 20

## 2022-07-11 MED ORDER — NITROGLYCERIN 0.4 MG SL SUBL
SUBLINGUAL_TABLET | SUBLINGUAL | Status: AC
  Filled 2022-07-11: qty 2

## 2022-07-11 MED ORDER — IOHEXOL 350 MG/ML SOLN
100.0000 mL | Freq: Once | INTRAVENOUS | Status: AC | PRN
  Administered 2022-07-11: 100 mL via INTRAVENOUS

## 2022-07-11 MED ORDER — DILTIAZEM HCL 25 MG/5ML IV SOLN
INTRAVENOUS | Status: AC
  Administered 2022-07-11: 10 mg via INTRAVENOUS
  Filled 2022-07-11: qty 5

## 2022-07-11 MED ORDER — NITROGLYCERIN 0.4 MG SL SUBL
0.8000 mg | SUBLINGUAL_TABLET | Freq: Once | SUBLINGUAL | Status: AC
  Administered 2022-07-11: 0.8 mg via SUBLINGUAL

## 2022-07-11 MED ORDER — DILTIAZEM HCL 25 MG/5ML IV SOLN
10.0000 mg | INTRAVENOUS | Status: DC | PRN
  Administered 2022-07-11: 10 mg via INTRAVENOUS

## 2022-07-11 MED ORDER — METOPROLOL TARTRATE 5 MG/5ML IV SOLN: 10.0000 mg | INTRAVENOUS | Status: DC | PRN

## 2022-07-11 NOTE — Telephone Encounter (Addendum)
Call attempt, no answer, unable to leave message.    ----- Message from Loel Dubonnet, NP sent at 07/11/2022  1:31 PM EST ----- Cardiac CTA with minimal nonobstructive coronary artery disease.  Coronary calcium score of 65.  No significant blockage that would cause chest pain.  Recommend medication management to help prevent progression.  Continue metoprolol succinate 25 mg daily.  Start aspirin 81 mg daily and rosuvastatin 5 mg daily. Repeat FLP/LFT in 8 weeks.  Provider note only-small PFO not of concern.

## 2022-07-13 NOTE — Telephone Encounter (Signed)
2nd call attempt, no answer, unable to leave VM   ----- Message from Loel Dubonnet, NP sent at 07/11/2022  1:31 PM EST ----- Cardiac CTA with minimal nonobstructive coronary artery disease.  Coronary calcium score of 65.  No significant blockage that would cause chest pain.  Recommend medication management to help prevent progression.  Continue metoprolol succinate 25 mg daily.  Start aspirin 81 mg daily and rosuvastatin 5 mg daily. Repeat FLP/LFT in 8 weeks.   Loel Dubonnet, NP  Gerald Stabs, RN Minimal nonobstructive coronary disease. Not enough to cause symptoms. Recommend secondary prevention. Start aspirin EC 81 mg QD and Rosuvastatin '5mg'$  QD with FLP/LFT in 6-8 weeks.

## 2022-07-14 ENCOUNTER — Encounter (HOSPITAL_BASED_OUTPATIENT_CLINIC_OR_DEPARTMENT_OTHER): Payer: Self-pay

## 2022-07-14 MED ORDER — ROSUVASTATIN CALCIUM 5 MG PO TABS: 5.0000 mg | ORAL_TABLET | Freq: Every day | ORAL | 3 refills | Status: DC

## 2022-07-14 MED ORDER — ASPIRIN 81 MG PO TBEC: 81.0000 mg | DELAYED_RELEASE_TABLET | Freq: Every day | ORAL | 3 refills | Status: DC

## 2022-07-14 NOTE — Telephone Encounter (Signed)
3rd call attempt, no answer, unable to leave VM, results mailed to patient, Rx to pharm and labs mailed with results.    ----- Message from Loel Dubonnet, NP sent at 07/11/2022  1:31 PM EST ----- Cardiac CTA with minimal nonobstructive coronary artery disease.  Coronary calcium score of 65.  No significant blockage that would cause chest pain.  Recommend medication management to help prevent progression.  Continue metoprolol succinate 25 mg daily.  Start aspirin 81 mg daily and rosuvastatin 5 mg daily. Repeat FLP/LFT in 8 weeks.     Loel Dubonnet, NP  Gerald Stabs, RN Minimal nonobstructive coronary disease. Not enough to cause symptoms. Recommend secondary prevention. Start aspirin EC 81 mg QD and Rosuvastatin '5mg'$  QD with FLP/LFT in 6-8 weeks.

## 2022-07-14 NOTE — Addendum Note (Signed)
Addended by: Gerald Stabs on: 07/14/2022 10:20 AM   Modules accepted: Orders

## 2022-07-31 ENCOUNTER — Ambulatory Visit (INDEPENDENT_AMBULATORY_CARE_PROVIDER_SITE_OTHER): Payer: 59 | Admitting: Family

## 2022-07-31 ENCOUNTER — Encounter (HOSPITAL_BASED_OUTPATIENT_CLINIC_OR_DEPARTMENT_OTHER): Payer: Self-pay | Admitting: Family

## 2022-07-31 VITALS — BP 136/87 | HR 100 | Ht 61.0 in | Wt 171.8 lb

## 2022-07-31 DIAGNOSIS — E785 Hyperlipidemia, unspecified: Secondary | ICD-10-CM | POA: Diagnosis not present

## 2022-07-31 DIAGNOSIS — I10 Essential (primary) hypertension: Secondary | ICD-10-CM

## 2022-07-31 DIAGNOSIS — I25118 Atherosclerotic heart disease of native coronary artery with other forms of angina pectoris: Secondary | ICD-10-CM | POA: Diagnosis not present

## 2022-07-31 DIAGNOSIS — Z01810 Encounter for preprocedural cardiovascular examination: Secondary | ICD-10-CM | POA: Diagnosis not present

## 2022-07-31 DIAGNOSIS — K219 Gastro-esophageal reflux disease without esophagitis: Secondary | ICD-10-CM

## 2022-07-31 DIAGNOSIS — J4540 Moderate persistent asthma, uncomplicated: Secondary | ICD-10-CM

## 2022-07-31 MED ORDER — METOPROLOL SUCCINATE ER 50 MG PO TB24: 50.0000 mg | ORAL_TABLET | Freq: Every day | ORAL | 3 refills | Status: DC

## 2022-07-31 MED ORDER — ESOMEPRAZOLE MAGNESIUM 40 MG PO CPDR: 40.0000 mg | DELAYED_RELEASE_CAPSULE | Freq: Every day | ORAL | 3 refills | Status: AC

## 2022-07-31 MED ORDER — ALBUTEROL SULFATE HFA 108 (90 BASE) MCG/ACT IN AERS: 2.0000 | INHALATION_SPRAY | Freq: Four times a day (QID) | RESPIRATORY_TRACT | 0 refills | Status: DC | PRN

## 2022-07-31 MED ORDER — MONTELUKAST SODIUM 10 MG PO TABS: 10.0000 mg | ORAL_TABLET | Freq: Every day | ORAL | 0 refills | Status: DC

## 2022-07-31 MED ORDER — AMLODIPINE BESYLATE 2.5 MG PO TABS: 2.5000 mg | ORAL_TABLET | Freq: Every day | ORAL | 3 refills | Status: DC

## 2022-07-31 NOTE — Progress Notes (Signed)
Office Visit    Patient Name: [name] Date of Encounter: 07/31/2022  PCP:  Benito Mccreedy, MD   [place]  Cardiologist:  Freada Bergeron, MD  Advanced Practice Provider:  No care team member to display Electrophysiologist:  None      Chief Complaint    [name] is a 77 y.o. female presents today for follow up after cardiac CTA   Past Medical History    Past Medical History:  Diagnosis Date   Allergy    Arthritis    Asthma    CAD (coronary artery disease)    Chronic headaches    Depression    Diabetes mellitus, type II ([place])    Diverticulosis    Endometriosis    GERD (gastroesophageal reflux disease)    Heart attack ([place])    High cholesterol    Hypertension    Mallory-Weiss tear 2003   Mood swings    Rheumatoid arthritis ([place])    Vitamin D deficiency    Past Surgical History:  Procedure Laterality Date   ABDOMINAL HYSTERECTOMY     EXPLORATORY LAPAROTOMY     I & D EXTREMITY Left 08/04/2017   Procedure: IRRIGATION AND DEBRIDEMENT HAND;  Surgeon: Leanora Cover, MD;  Location: [facility];  Service: Orthopedics;  Laterality: Left;   LEFT HEART CATHETERIZATION WITH CORONARY ANGIOGRAM N/A 04/10/2013   Procedure: LEFT HEART CATHETERIZATION WITH CORONARY ANGIOGRAM;  Surgeon: Birdie Riddle, MD;  Location: [facility];  Service: Cardiovascular;  Laterality: N/A;    Allergies  No Known Allergies  History of Present Illness    NYCOLE Crane is a 77 y.o. female with a hx of OSA, hypertension, asthma, insomnia, depression, nonobstructive coronary artery disease, rheumatoid arthritis last seen 07/03/2022 by Venia Carbon, NP.  Established with Dr. Johney Frame October 2023 due to atypical chest pain and lower extremity edema.  Prior LHC in 2014 with no coronary atherosclerosis.  Echocardiogram 02/22/2022 normal LVEF, normal diastolic parameters, RV normal, trivial MR.  She was seen 07/03/2022 noting chest pain (prior admission  in 2014 with LHC revealing no coronary disease).  She was not taking amlodipine and it was refilled at 2.5 mg daily.  She noted lower extremity edema which was suspected to be related to high sodium diet.  Cardiac CTA 07/11/2022 with aortic atherosclerosis, coronary calcium score of 65 placing her in the 64th percentile.  Overall minimal nonobstructive CAD (ostial LAD and circumflex 0 to 24% stenosis) with) finding of small PFO.  She was recommended to start aspirin 81 mg daily rosuvastatin 5 mg daily.  She presents today for follow-up with her nurse.  Reviewed CTA report and she was reassured by result.  Since last seen had 1 episode of right-sided chest pain described as pulling over to the left side that occurred at rest.  Discussed this is more likely related to her right shoulder arthritis for which she is pending replacement.  Systolic BP at home most often 140s-150s.  Note she did take an extra dose of her metoprolol 1 day with improvement in blood pressure.Reports some exertional dyspnea which started 3-4 days ago which she attributes to asthma.  She has been out of her Nexium, Singulair, as needed albuterol which we will provide temporary refill for.  Encouragement to schedule overdue follow-up with asthma team.  EKGs/Labs/Other Studies Reviewed:   The following studies were reviewed today: Cardiac Studies & Procedures       ECHOCARDIOGRAM  ECHOCARDIOGRAM COMPLETE 02/22/2022  Narrative ECHOCARDIOGRAM REPORT    Patient Name:   [name] Date of Exam: 02/21/2022 Medical Rec #:  [id number]        Height:       62.0 in Accession #:    [id number]       Weight:       168.6 lb Date of Birth:  02-08-1946       BSA:          1.778 m Patient Age:    72 years         BP:           132/80 mmHg Patient Gender: F                HR:           79 bpm. Exam Location:  [address]  Procedure: 2D Echo, Cardiac Doppler and Color Doppler  Indications:    R60.0 Edema  History:         Patient has no prior history of Echocardiogram examinations. Risk Factors:Hypertension.  Sonographer:    Cresenciano Lick RDCS Referring Phys: [id number] [place]   1. Left ventricular ejection fraction, by estimation, is 55 to 60%. The left ventricle has normal function. The left ventricle has no regional wall motion abnormalities. Left ventricular diastolic parameters were normal. 2. Right ventricular systolic function is normal. The right ventricular size is normal. There is normal pulmonary artery systolic pressure. 3. The mitral valve is normal in structure. Trivial mitral valve regurgitation. No evidence of mitral stenosis. 4. The aortic valve is tricuspid. Aortic valve regurgitation is not visualized. No aortic stenosis is present. 5. The inferior vena cava is normal in size with greater than 50% respiratory variability, suggesting right atrial pressure of 3 mmHg.  Comparison(s): No prior Echocardiogram.  Conclusion(s)/Recommendation(s): Normal biventricular function without evidence of hemodynamically significant valvular heart disease.  FINDINGS Left Ventricle: Left ventricular ejection fraction, by estimation, is 55 to 60%. The left ventricle has normal function. The left ventricle has no regional wall motion abnormalities. The left ventricular internal cavity size was normal in size. There is no left ventricular hypertrophy. Left ventricular diastolic parameters were normal.  Right Ventricle: The right ventricular size is normal. No increase in right ventricular wall thickness. Right ventricular systolic function is normal. There is normal pulmonary artery systolic pressure. The tricuspid regurgitant velocity is 2.47 m/s, and with an assumed right atrial pressure of 3 mmHg, the estimated right ventricular systolic pressure is [id number].  Left Atrium: Left atrial size was normal in size.  Right Atrium: Right atrial size was normal in  size.  Pericardium: There is no evidence of pericardial effusion.  Mitral Valve: The mitral valve is normal in structure. Trivial mitral valve regurgitation. No evidence of mitral valve stenosis.  Tricuspid Valve: The tricuspid valve is normal in structure. Tricuspid valve regurgitation is trivial. No evidence of tricuspid stenosis.  Aortic Valve: The aortic valve is tricuspid. Aortic valve regurgitation is not visualized. No aortic stenosis is present.  Pulmonic Valve: The pulmonic valve was not well visualized. Pulmonic valve regurgitation is not visualized. No evidence of pulmonic stenosis.  Aorta: The aortic root, ascending aorta, aortic arch and descending aorta are all structurally normal, with no evidence of dilitation or obstruction.  Venous: The inferior vena cava is normal in size with greater than 50% respiratory variability, suggesting right atrial pressure of 3 mmHg.  IAS/Shunts: No atrial level shunt detected by color flow Doppler.  LEFT VENTRICLE PLAX 2D LVIDd:         4.00 cm   Diastology LVIDs:         2.80 cm   LV e' medial:    8.09 cm/s LV PW:         0.90 cm   LV E/e' medial:  8.9 LV IVS:        0.90 cm   LV e' lateral:   8.52 cm/s LVOT diam:     2.00 cm   LV E/e' lateral: 8.5 LV SV:         85 LV SV Index:   48 LVOT Area:     3.14 cm   RIGHT VENTRICLE             IVC RV Basal diam:  3.50 cm     IVC diam: 1.60 cm RV S prime:     17.15 cm/s TAPSE (M-mode): 2.4 cm  LEFT ATRIUM             Index        RIGHT ATRIUM           Index LA diam:        3.70 cm 2.08 cm/m   RA Area:     11.10 cm LA Vol (A2C):   40.9 ml 23.01 ml/m  RA Volume:   27.00 ml  15.19 ml/m LA Vol (A4C):   33.8 ml 19.01 ml/m LA Biplane Vol: 38.4 ml 21.60 ml/m AORTIC VALVE LVOT Vmax:   124.50 cm/s LVOT Vmean:  87.050 cm/s LVOT VTI:    0.271 m  AORTA Ao Root diam: 3.50 cm Ao Asc diam:  3.20 cm  MITRAL VALVE                TRICUSPID VALVE MV Area (PHT): 4.10 cm     TR Peak  grad:   24.4 mmHg MV Decel Time: 185 msec     TR Vmax:        247.00 cm/s MV E velocity: 72.30 cm/s MV A velocity: 101.90 cm/s  SHUNTS MV E/A ratio:  0.71         Systemic VTI:  0.27 m Systemic Diam: 2.00 cm  Buford Dresser MD Electronically signed by Buford Dresser MD Signature Date/Time: 02/22/2022/8:22:30 AM    Final     CT SCANS  CT CORONARY MORPH W/CTA COR W/SCORE 07/12/2022  Addendum 07/12/2022  7:41 PM ADDENDUM REPORT: 07/12/2022 19:38  EXAM: OVER-READ INTERPRETATION  CT CHEST  The following report is an over-read performed by radiologist Dr. Inez Catalina of [facility], [place] on 07/12/2022. This over-read does not include interpretation of cardiac or coronary anatomy or pathology. The coronary calcium score/coronary CTA interpretation by the cardiologist is attached.  COMPARISON:  None.  FINDINGS: Cardiovascular: Atherosclerotic calcifications of the thoracic aorta are noted without aneurysmal dilatation. Pulmonary artery shows no evidence of pulmonary embolism.  Mediastinum/Nodes: There are no enlarged lymph nodes within the visualized mediastinum.Esophagus as visualized is within normal limits.  Lungs/Pleura: There is no pleural effusion. The visualized lungs appear clear.  Upper abdomen: Visualized upper abdomen is within normal limits.  Musculoskeletal/Chest wall: No chest wall mass or suspicious osseous findings within the visualized chest.  IMPRESSION: Aortic Atherosclerosis (ICD10-I70.0).  No other significant extracardiac findings are noted.   Electronically Signed By: Inez Catalina M.D. On: 07/12/2022 19:38  Narrative CLINICAL DATA:  77 year old with chest pain  EXAM: Cardiac/Coronary  CTA  TECHNIQUE: The patient was scanned on  a [employer].  FINDINGS: A 120 kV prospective scan was triggered in the descending thoracic aorta at 111 HU's. Axial non-contrast 3 mm slices were carried out through the heart.  The data set was analyzed on a dedicated work station and scored using the [place]. Gantry rotation speed was 250 msecs and collimation was .6 mm. 0.8 mg of sl NTG was given. The 3D data set was reconstructed in 5% intervals of the 67-82 % of the R-R cycle. Diastolic phases were analyzed on a dedicated work station using MPR, MIP and VRT modes. The patient received 80 cc of contrast.  Image quality: Fair, misregistration artifact, however interpretable.  Aorta: Normal size (34 mm ascending). No calcifications. No dissection.  Aortic Valve:  Trileaflet.  No calcifications.  Coronary Arteries:  Normal coronary origin.  Right dominance.  RCA is a large dominant artery that gives rise to PDA and PLA. There is no plaque. PDA is small caliber and underfilled.  Left main is a large artery that gives rise to LAD and LCX arteries.  LAD is a large vessel that has ostial calcified plaque, 0-24% stenosis.  D1: Small under filled.  LCX is a non-dominant artery. There is ostial calcified plaque, 0-24% stenosis.  OM1, 2, 3 are small in caliber, no plaque  Other findings:  Normal pulmonary vein drainage into the left atrium.  Normal left atrial appendage without a thrombus.  Normal size of the pulmonary artery.  Please see radiology report for non cardiac findings.  IMPRESSION: 1. Coronary calcium score of 65. This was 72 percentile for age and sex matched control.  2. Normal coronary origin with right dominance.  3. Minimal non obstructive CAD (Ostial LAD and Circumflex calcified plaque, 0-24% stenosis).  4. Small PFO  CAD-RADS 1. Minimal non-obstructive CAD (0-24%). Consider non-atherosclerotic causes of chest pain. Consider preventive therapy and risk factor modification.  Electronically Signed: By: Candee Furbish M.D. On: 07/11/2022 11:32           EKG:  EKG is not ordered today.    Recent Labs: 02/08/2022: NT-Pro BNP 76 05/31/2022: ALT 11 07/03/2022: BNP  48.0; BUN 16; Creatinine, Ser 0.93; Hemoglobin 13.5; Platelets 284; Potassium 4.4; Sodium 141  Recent Lipid Panel    Component Value Date/Time   CHOL 157 02/08/2022 1021   TRIG 105 02/08/2022 1021   HDL 77 02/08/2022 1021   CHOLHDL 2.0 02/08/2022 1021   LDLCALC 61 02/08/2022 1021     Home Medications   Current Meds  Medication Sig   albuterol (VENTOLIN HFA) 108 (90 Base) MCG/ACT inhaler Inhale 2 puffs into the lungs every 6 (six) hours as needed for wheezing or shortness of breath.   aspirin EC 81 MG tablet Take 1 tablet (81 mg total) by mouth daily. Swallow whole.   buPROPion (WELLBUTRIN XL) 150 MG 24 hr tablet Take 150 mg by mouth every morning.   fluticasone (FLONASE) 50 MCG/ACT nasal spray Place 2 sprays into both nostrils daily.   gabapentin (NEURONTIN) 400 MG capsule Take 400 mg by mouth 3 (three) times daily.   lidocaine (LIDODERM) 5 % Place 1 patch onto the skin daily as needed. Apply patch to area most significant pain once per day.  Remove and discard patch within 12 hours of application.   meclizine (ANTIVERT) 12.5 MG tablet Take 1 tablet (12.5 mg total) by mouth 3 (three) times daily as needed for dizziness.   montelukast (SINGULAIR) 10 MG tablet Take 1 tablet (10 mg total) by mouth  at bedtime.   nitroGLYCERIN (NITROSTAT) 0.3 MG SL tablet Place 0.3 mg under the tongue every 5 (five) minutes as needed for chest pain.   QUEtiapine (SEROQUEL) 25 MG tablet Take 25 mg by mouth at bedtime.   rosuvastatin (CRESTOR) 5 MG tablet Take 1 tablet (5 mg total) by mouth daily.   traMADol (ULTRAM) 50 MG tablet Take 1 tablet (50 mg total) by mouth every 8 (eight) hours as needed.   Vitamin D, Ergocalciferol, (DRISDOL) 1.25 MG (50000 UNIT) CAPS capsule Take 50,000 Units by mouth once a week.   [DISCONTINUED] amLODipine (NORVASC) 2.5 MG tablet Take 1 tablet (2.5 mg total) by mouth daily.   [DISCONTINUED] metoprolol succinate (TOPROL-XL) 25 MG 24 hr tablet Take 25 mg by mouth daily.      Review of Systems      All other systems reviewed and are otherwise negative except as noted above.  Physical Exam    VS:  BP 136/87   Pulse 100   Ht 5\' 1"  (1.549 m)   Wt 171 lb 12.8 oz (77.9 kg)   BMI 32.46 kg/m  , BMI Body mass index is 32.46 kg/m.  Wt Readings from Last 3 Encounters:  07/31/22 171 lb 12.8 oz (77.9 kg)  07/03/22 176 lb (79.8 kg)  05/31/22 174 lb (78.9 kg)     GEN: Well nourished, well developed, in no acute distress. HEENT: normal. Neck: Supple, no JVD, carotid bruits, or masses. Cardiac: RRR, no murmurs, rubs, or gallops. No clubbing, cyanosis, edema.  Radials/PT 2+ and equal bilaterally.  Respiratory:  Respirations regular and unlabored, clear to auscultation bilaterally. GI: Soft, nontender, nondistended. MS: No deformity or atrophy. Skin: Warm and dry, no rash. Neuro:  Strength and sensation are intact. Psych: Normal affect.  Assessment & Plan    Preop clearance - Coronary CTA 07/2022 minimal nonobstructive disease. According to the Revised Cardiac Risk Index (RCRI), her Perioperative Risk of Major Cardiac Event is (%): 0.9. Her Functional Capacity in METs is: 4.64 according to the [employer] Activity Status Index (DASI). Per AHA/ACC guidelines, she is deemed acceptable risk for the planned procedure without additional cardiovascular testing. May hold aspirin 5-7 days prior to shoulder surgery and dental work if deemed necessary by procedural team. Will route to surgical team so they are aware.    Nonobstructive CAD / HLD, LDL goal <70 -coronary CTA 07/11/2022 with minimal nonobstructive coronary disease (ostial LAD and circumflex 0-24% stenosis).  GDMT aspirin, metoprolol, rosuvastatin.  Update FLP/LFT in 2 months. Heart healthy diet and regular cardiovascular exercise encouraged.    HTN - BP not at goal less than 130/80.  Continue P 5 mg daily.  Increase metoprolol from 25 to 50 mg daily as she reports this previously controlled her BP. Discussed to monitor BP  at home at least 2 hours after medications and sitting for 5-10 minutes.   LE edema -much improved since she has reduced her sodium intake.  Prior echocardiogram with normal LVEF.  Swelling likely related to venous insufficiency, high sodium diet.  GERD - Refill of Nexium provided for 30 days. Future refills per PCP.  Asthma - Mild worsening of breathing similar to previous asthma symptoms. No wheeze on exam. Will refill PRN Albuterol, Singulair for 30 day supply. Future refills with PCP or Allergy Encouraged to schedule follow up with Dr. Neldon Mc.          Disposition: Follow up in 3 month(s) with Freada Bergeron, MD or APP.  Signed, Loel Dubonnet, NP  07/31/2022, 12:54 PM [facility]

## 2022-07-31 NOTE — Patient Instructions (Addendum)
Medication Instructions:  Your physician has recommended you make the following change in your medication:   Increase: Metoprolol Succinate 50mg  daily   We have refilled your Nexium, amlodipine, albuterol and singular to the [facility] [place].   Recommend thinking about a pill pack from either [facility] or [facility].   *If you need a refill on your cardiac medications before your next appointment, please call your pharmacy*   Lab Work: Your physician recommends that you return for lab work in 2 months for fasting cholesterol panel and CMP  Please return for Lab work. You may come to the...   [facility] (3rd floor) [address], [place], [place] [address]  Open: 8am-Noon and 1pm-4:30pm  Please ring the doorbell on the small table when you exit the elevator and the Lab Tech will come get you  [place] [address] [place], [place], [place] [address] Open: 8am-1pm, then 2pm-4:30pm   [place]- Please see attached locations sheet stapled to your lab work with address and hours.   If you have labs (blood work) drawn today and your tests are completely normal, you will receive your results only by: [place] (if you have MyChart) OR A paper copy in the mail If you have any lab test that is abnormal or we need to change your treatment, we will call you to review the results.   Testing/Procedures: Your CT scan showed only minimal plaque in your heart arteries. This is not enough to cause any symptoms.   For coronary artery disease often called "heart disease" we aim for optimal guideline directed medical therapy. We use the "A, B, C"s to help keep [place] on track!  A = Aspirin 81mg  daily B = Beta blocker which helps to relax the heart. This is your Metoprolol. And blood pressure control. C = Cholesterol control. You take Rosuvastatin to help control your cholesterol.    Follow-Up: At [facility], you and your health needs are our priority.  As part of our continuing mission to provide you with exceptional heart care, we have created designated Provider Care Teams.  These Care Teams include your primary Cardiologist (physician) and Advanced Practice Providers (APPs -  Physician Assistants and Nurse Practitioners) who all work together to provide you with the care you need, when you need it.  We recommend signing up for the patient portal called "MyChart".  Sign up information is provided on this After Visit Summary.  MyChart is used to connect with patients for Virtual Visits (Telemedicine).  Patients are able to view lab/test results, encounter notes, upcoming appointments, etc.  Non-urgent messages can be sent to your provider as well.   To learn more about what you can do with MyChart, go to [email or web address].    Your next appointment:   3 month(s)  Provider:   Gwyndolyn Kaufman, MD or Laurann Montana, NP    Other Instructions  [place] [facility] [place] [facility] (Dr. Neldon Mc) [address], [place] [address] [phone]  Tips to Measure your Blood Pressure Correctly  To determine whether you have hypertension, a medical professional will take a blood pressure reading. How you prepare for the test, the position of your arm, and other factors can change a blood pressure reading by 10% or more. That could be enough to hide high blood pressure, start you on a drug you don't really need, or lead your doctor to  incorrectly adjust your medications.  National and international guidelines offer specific instructions for measuring blood pressure. If a doctor, nurse, or medical assistant isn't doing it right, don't hesitate to ask him or her to get with the guidelines.  Here's what you can do to ensure a correct reading:  Don't drink a caffeinated beverage or smoke during the 30 minutes before the test.  Sit quietly for five minutes before the test  begins.  During the measurement, sit in a chair with your feet on the floor and your arm supported so your elbow is at about heart level.  The inflatable part of the cuff should completely cover at least 80% of your upper arm, and the cuff should be placed on bare skin, not over a shirt.  Don't talk during the measurement.  Have your blood pressure measured twice, with a brief break in between. If the readings are different by 5 points or more, have it done a third time.  In 2017, new guidelines from the [place], the [employer] of Cardiology, and nine other health organizations lowered the diagnosis of high blood pressure to 130/80 mm Hg or higher for all adults. The guidelines also redefined the various blood pressure categories to now include normal, elevated, Stage 1 hypertension, Stage 2 hypertension, and hypertensive crisis (see "Blood pressure categories").  Blood pressure categories  Blood pressure category SYSTOLIC (upper number)  DIASTOLIC (lower number)  Normal Less than 120 mm Hg and Less than 80 mm Hg  Elevated 120-129 mm Hg and Less than 80 mm Hg  High blood pressure: Stage 1 hypertension 130-139 mm Hg or 80-89 mm Hg  High blood pressure: Stage 2 hypertension 140 mm Hg or higher or 90 mm Hg or higher  Hypertensive crisis (consult your doctor immediately) Higher than 180 mm Hg and/or Higher than 120 mm Hg  Source: American Heart Association and American Stroke Association. For more on getting your blood pressure under control, buy Controlling Your Blood Pressure, a Special Health Report from [facility].   Blood Pressure Log   Date   Time  Blood Pressure  Example: Nov 1 9 AM 124/78

## 2022-08-14 ENCOUNTER — Ambulatory Visit (INDEPENDENT_AMBULATORY_CARE_PROVIDER_SITE_OTHER): Payer: 59 | Admitting: Orthopedic Surgery

## 2022-08-14 ENCOUNTER — Other Ambulatory Visit (INDEPENDENT_AMBULATORY_CARE_PROVIDER_SITE_OTHER): Payer: 59

## 2022-08-14 ENCOUNTER — Encounter: Payer: Self-pay | Admitting: Orthopedic Surgery

## 2022-08-14 DIAGNOSIS — M5412 Radiculopathy, cervical region: Secondary | ICD-10-CM | POA: Diagnosis not present

## 2022-08-14 NOTE — Progress Notes (Addendum)
Office Visit Note   Patient: Deborah JordanBobby S Sampsel           Date of Birth: 1946/01/31           MRN: [id number] Visit Date: 08/14/2022 Requested by: Jackie Plumsei-Bonsu, George, MD [id number] [place],  [place] [id number] PCP: Jackie Plumsei-Bonsu, George, MD  Subjective: Chief Complaint  Patient presents with   Right Shoulder - Pain    HPI: [name] is a 77 y.o. female who presents to the office reporting right shoulder and arm pain.  Patient describes the pain as radicular in nature.  Reports both neck pain and right scapular pain.  States that she wakes with pain.  She is right-hand dominant.  Is able to use the left arm per patient report.  She has known history of severe right  shoulder arthritis.  But this pain is different than the chronic right shoulder pain she associates with the arthritis.  She states she has been cleared by cardiology for right shoulder replacement.  There is some question about having some drainage under the left axilla.  Overall the right shoulder and arm pain is more significant a problem than the left shoulder pain at this time..                ROS: All systems reviewed are negative as they relate to the chief complaint within the history of present illness.  Patient denies fevers or chills.  Assessment & Plan: Visit Diagnoses:  1. Radiculopathy, cervical region     Plan: Impression is right shoulder and arm pain which is radicular in nature.  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  I think this has gotten worse.  She needs repeat cervical spine MRI and probable cervical spine intervention prior to any elective left shoulder intervention.  Do not see any type of draining fistula in the left axilla.  Follow-Up Instructions: No follow-ups on file.   Orders:  Orders Placed This Encounter  Procedures   XR Cervical Spine 2 or 3 views   MR Cervical Spine w/o contrast   No orders of the defined types were placed in this  encounter.     Procedures: No procedures performed   Clinical Data: No additional findings.  Objective: Vital Signs: There were no vitals taken for this visit.  Physical Exam:  Constitutional: Patient appears well-developed HEENT:  Head: Normocephalic Eyes:EOM are normal Neck: Normal range of motion Cardiovascular: Normal rate Pulmonary/chest: Effort normal Neurologic: Patient is alert Skin: Skin is warm Psychiatric: Patient has normal mood and affect  Ortho Exam: Ortho exam demonstrates mild pain with rotation of the head to the right and left.  Flexion extension is about 30 degrees of extension and she lacks about an inch of reaching chin to chest.  She has slightly diminished grip strength on the right compared to the left.  Diminished passive range of motion of the right shoulder.  Deltoid is slightly weaker on the right compared to the left but does fire.  Some coarseness and grinding is present on the right-hand side.  Reflexes 0 to 1+ out of 4 bilateral biceps and triceps.  Specialty Comments:  No specialty comments available.  Imaging: XR Cervical Spine 2 or 3 views  Result Date: 08/14/2022 AP lateral radiographs cervical spine reviewed.  Degenerative spondylolisthesis present at C3-4.  Degenerative facet arthritis is present throughout the cervical spine.  No acute compression fractures.  Degenerative disc space is also present worse in  the mid to lower cervical spine levels.    PMFS History: Patient Active Problem List   Diagnosis Date Noted   Moderate persistent asthma with acute exacerbation 12/05/2017   Perennial allergic rhinitis 09/20/2017   MDD (major depressive disorder) 07/31/2017   Insomnia 02/23/2015   OSA (obstructive sleep apnea) 02/23/2015   Asthma 02/23/2015   Chest pain on exertion 04/10/2013   Menopausal hot flushes 01/22/2013   Frequency of urination 01/22/2013   Past Medical History:  Diagnosis Date   Allergy    Arthritis    Asthma     CAD (coronary artery disease)    Chronic headaches    Depression    Diabetes mellitus, type II (HCC)    Diverticulosis    Endometriosis    GERD (gastroesophageal reflux disease)    Heart attack (HCC)    High cholesterol    Hypertension    Mallory-Weiss tear 2003   Mood swings    Rheumatoid arthritis (HCC)    Vitamin D deficiency     Family History  Problem Relation Age of Onset   Cancer Mother    Heart disease Mother    Cancer Father    Heart disease Father     Past Surgical History:  Procedure Laterality Date   ABDOMINAL HYSTERECTOMY     EXPLORATORY LAPAROTOMY     I & D EXTREMITY Left 08/04/2017   Procedure: IRRIGATION AND DEBRIDEMENT HAND;  Surgeon: Betha Loa, MD;  Location: [facility] OR;  Service: Orthopedics;  Laterality: Left;   LEFT HEART CATHETERIZATION WITH CORONARY ANGIOGRAM N/A 04/10/2013   Procedure: LEFT HEART CATHETERIZATION WITH CORONARY ANGIOGRAM;  Surgeon: Ricki Rodriguez, MD;  Location: [facility] CATH [facility];  Service: Cardiovascular;  Laterality: N/A;   Social History   Occupational History   Not on file  Tobacco Use   Smoking status: Former    Types: Cigarettes   Smokeless tobacco: Never  Vaping Use   Vaping Use: Never used  Substance and Sexual Activity   Alcohol use: Yes    Comment: occasional   Drug use: No   Sexual activity: Never

## 2022-09-13 ENCOUNTER — Ambulatory Visit
Admission: RE | Admit: 2022-09-13 | Discharge: 2022-09-13 | Disposition: A | Discharge status: Home / Self Care | Payer: 59 | Source: Ambulatory Visit | Attending: Orthopedic Surgery | Admitting: Orthopedic Surgery

## 2022-09-13 DIAGNOSIS — M5412 Radiculopathy, cervical region: Secondary | ICD-10-CM

## 2022-09-18 NOTE — Progress Notes (Signed)
Hi Lauren I tried calling [name] twice and they hung up on me both times.  I used the [phone] number.  In general she has severe right-sided foraminal stenosis and needs to see Dr. Alvester Morin.  Can you try to reach her tomorrow to inform her of same and just get her set up to go see Dr. Alvester Morin for injections in her neck.  Thanks

## 2022-09-19 ENCOUNTER — Telehealth: Payer: Self-pay

## 2022-09-19 NOTE — Telephone Encounter (Signed)
-----   Message from Cammy Copa, MD sent at 09/18/2022  5:43 PM EDT ----- Cresenciano Lick I tried calling Daise[facility] twice and they hung up on me both times.  I used the [phone] number.  In general she has severe right-sided foraminal stenosis and needs to see Dr. Alvester Morin.  Can you try to reach her tomorrow to inform her of same and j ust get her set up to go see Dr. Alvester Morin for injections in her neck.  Thanks

## 2022-09-19 NOTE — Telephone Encounter (Signed)
I tried as well without being able to reach pt.

## 2022-09-21 ENCOUNTER — Telehealth: Payer: Self-pay | Admitting: Physical Medicine and Rehabilitation

## 2022-09-21 ENCOUNTER — Ambulatory Visit (INDEPENDENT_AMBULATORY_CARE_PROVIDER_SITE_OTHER): Payer: 59 | Admitting: Surgical

## 2022-09-21 ENCOUNTER — Telehealth: Payer: Self-pay | Admitting: Orthopedic Surgery

## 2022-09-21 DIAGNOSIS — M5412 Radiculopathy, cervical region: Secondary | ICD-10-CM | POA: Diagnosis not present

## 2022-09-21 DIAGNOSIS — M19011 Primary osteoarthritis, right shoulder: Secondary | ICD-10-CM | POA: Diagnosis not present

## 2022-09-21 NOTE — Telephone Encounter (Signed)
Call Healthcare team ask for Marcelino Duster the nurse at [phone] for scheduling

## 2022-09-22 ENCOUNTER — Encounter: Payer: Self-pay | Admitting: Orthopedic Surgery

## 2022-09-22 NOTE — Progress Notes (Signed)
Office Visit Note   Patient: [name]           Date of Birth: [birth year]           MRN: [id number] Visit Date: 09/21/2022 Requested by: Jackie Plum, MD [address] [place],  [place] [address] PCP: Jackie Plum, MD  Subjective: Chief Complaint  Patient presents with   Other     Scan review    HPI: [name] is a 77 y.o. female who presents to the office for MRI review. Patient denies any changes in symptoms.  Continues to complain mainly of cervical stiffness and pain with range of motion of the neck.  She also has scapular pain and radiating pain that radiates from her neck down to the right elbow.  She has numbness and tingling in the palmar aspects of her index and middle fingers.  Takes aspirin.  MRI results revealed: MR Cervical Spine w/o contrast  Result Date: 09/18/2022 CLINICAL DATA:  Cervical radiculopathy, shoulder dislocation 1.5 years ago EXAM: MRI CERVICAL SPINE WITHOUT CONTRAST TECHNIQUE: Multiplanar, multisequence MR imaging of the cervical spine was performed. No intravenous contrast was administered. COMPARISON:  11/20/2013 FINDINGS: Patient motion degrades image quality limiting evaluation. Alignment: 3 mm anterolisthesis of C4 on C5. Vertebrae: No acute fracture, evidence of discitis, or aggressive bone lesion. C4 vertebral body hemangioma. Cord: Normal signal and morphology. Posterior Fossa, vertebral arteries, paraspinal tissues: Posterior fossa demonstrates no focal abnormality. Vertebral artery flow voids are maintained. Paraspinal soft tissues are unremarkable. Disc levels: Discs: Degenerative disease with disc height loss throughout the cervical and upper thoracic spine. C2-3: Broad-based disc bulge. Mild bilateral facet arthropathy. No left foraminal stenosis. Severe right foraminal stenosis. Mild spinal stenosis. C3-4: Broad-based disc bulge with a broad central disc protrusion. Moderate right and mild left facet  arthropathy. Mild left foraminal stenosis. Severe right foraminal stenosis. Severe spinal stenosis. C4-5: No disc protrusion. Moderate bilateral facet arthropathy. Bilateral uncovertebral degenerative changes. Severe bilateral foraminal stenosis. Moderate spinal stenosis. C5-6: Broad-based disc bulge. Mild right facet arthropathy. Bilateral uncovertebral degenerative changes. Severe bilateral foraminal stenosis. No spinal stenosis. C6-7: Broad-based disc osteophyte complex. Bilateral uncovertebral degenerative changes. Mild right foraminal stenosis. Severe left foraminal stenosis. Mild spinal stenosis. C7-T1: Mild broad-based disc bulge. No foraminal or central canal stenosis. On the sagittal images there broad-based disc bulges at T1-2, T2-3, T3-4 and T4-5. Severe left and moderate right foraminal stenosis at T1-2. Moderate bilateral foraminal stenosis at T2-3. IMPRESSION: 1. Diffuse cervical spine spondylosis as described above. 2. No acute osseous injury of the cervical spine. Electronically Signed   By: Elige Ko M.D.   On: 09/18/2022 11:13                 ROS: All systems reviewed are negative as they relate to the chief complaint within the history of present illness.  Patient denies fevers or chills.  Assessment & Plan: Visit Diagnoses:  1. Radiculopathy, cervical region   2. Arthritis of right shoulder region     Plan: [name] is a 77 y.o. female who presents to the office for review of cervical spine MRI.  MRI demonstrates severe bilateral foraminal stenosis at multiple levels which are likely contributing to the neck, shoulder, radicular pain that she is having in her right arm.  She also have some stiffness of the right shoulder that is consistent with her right shoulder arthritis.  Likely has a combination of pain from the neck and the shoulder.  She has not had glenohumeral injection.  Plan is to get her set up for cervical spine ESI with Dr. Alvester Morin and then return after this for  evaluation with Dr. August Saucer to see how much this helps and consider either glenohumeral injection or discussion for reverse shoulder replacement at that time.  Patient agreed with plan.  Follow-up after ESI.  Follow-Up Instructions: No follow-ups on file.   Orders:  No orders of the defined types were placed in this encounter.  No orders of the defined types were placed in this encounter.     Procedures: No procedures performed   Clinical Data: No additional findings.  Objective: Vital Signs: There were no vitals taken for this visit.  Physical Exam:  Constitutional: Patient appears well-developed HEENT:  Head: Normocephalic Eyes:EOM are normal Neck: Normal range of motion Cardiovascular: Normal rate Pulmonary/chest: Effort normal Neurologic: Patient is alert Skin: Skin is warm Psychiatric: Patient has normal mood and affect  Ortho Exam: Ortho exam demonstrates intact EPL, abduction, grip strength, pronation/supination, bicep, tricep, deltoid of the right upper extremity.  She has tenderness throughout the axial cervical spine.  Negative Spurling sign.  Negative Lhermitte sign.  Negative Hoffmann sign of the right upper extremity.  Specialty Comments:  No specialty comments available.  Imaging: No results found.   PMFS History: Patient Active Problem List   Diagnosis Date Noted   Moderate persistent asthma with acute exacerbation 12/05/2017   Perennial allergic rhinitis 09/20/2017   MDD (major depressive disorder) 07/31/2017   Insomnia 02/23/2015   OSA (obstructive sleep apnea) 02/23/2015   Asthma 02/23/2015   Chest pain on exertion 04/10/2013   Menopausal hot flushes 01/22/2013   Frequency of urination 01/22/2013   Past Medical History:  Diagnosis Date   Allergy    Arthritis    Asthma    CAD (coronary artery disease)    Chronic headaches    Depression    Diabetes mellitus, type II (HCC)    Diverticulosis    Endometriosis    GERD (gastroesophageal reflux  disease)    Heart attack (HCC)    High cholesterol    Hypertension    Mallory-Weiss tear 2003   Mood swings    Rheumatoid arthritis (HCC)    Vitamin D deficiency     Family History  Problem Relation Age of Onset   Cancer Mother    Heart disease Mother    Cancer Father    Heart disease Father     Past Surgical History:  Procedure Laterality Date   ABDOMINAL HYSTERECTOMY     EXPLORATORY LAPAROTOMY     I & D EXTREMITY Left 08/04/2017   Procedure: IRRIGATION AND DEBRIDEMENT HAND;  Surgeon: Betha Loa, MD;  Location: [facility] OR;  Service: Orthopedics;  Laterality: Left;   LEFT HEART CATHETERIZATION WITH CORONARY ANGIOGRAM N/A 04/10/2013   Procedure: LEFT HEART CATHETERIZATION WITH CORONARY ANGIOGRAM;  Surgeon: Ricki Rodriguez, MD;  Location: [facility]ATH [facility];  Service: Cardiovascular;  Laterality: N/A;   Social History   Occupational History   Not on file  Tobacco Use   Smoking status: Former    Types: Cigarettes   Smokeless tobacco: Never  Vaping Use   Vaping Use: Never used  Substance and Sexual Activity   Alcohol use: Yes    Comment: occasional   Drug use: No   Sexual activity: Never

## 2022-09-28 NOTE — Telephone Encounter (Signed)
Called [facility] and spoke with [place]. She stated Marcelino Duster is out of the office but will return tomorrow. Left message to call me tomorrow

## 2022-10-04 ENCOUNTER — Other Ambulatory Visit: Payer: Self-pay

## 2022-10-04 DIAGNOSIS — M5412 Radiculopathy, cervical region: Secondary | ICD-10-CM

## 2022-10-19 ENCOUNTER — Other Ambulatory Visit: Payer: Self-pay

## 2022-10-19 ENCOUNTER — Ambulatory Visit (INDEPENDENT_AMBULATORY_CARE_PROVIDER_SITE_OTHER): Payer: 59 | Admitting: Physical Medicine and Rehabilitation

## 2022-10-19 VITALS — BP 148/60 | HR 74

## 2022-10-19 DIAGNOSIS — M5412 Radiculopathy, cervical region: Secondary | ICD-10-CM | POA: Diagnosis not present

## 2022-10-19 MED ORDER — METHYLPREDNISOLONE ACETATE 80 MG/ML IJ SUSP
80.0000 mg | Freq: Once | INTRAMUSCULAR | Status: AC
Start: 2022-10-19 — End: 2022-10-19
  Administered 2022-10-19: 80 mg

## 2022-10-19 NOTE — Progress Notes (Signed)
Functional Pain Scale - descriptive words and definitions  Unmanageable (7)  Pain interferes with normal ADL's/nothing seems to help/sleep is very difficult/active distractions are very difficult to concentrate on. Severe range order  Average Pain 5   +Driver, -BT, -Dye Allergies.  Lower back pain on both sides that radiates into both arms

## 2022-10-19 NOTE — Patient Instructions (Signed)

## 2022-10-30 NOTE — Progress Notes (Signed)
[name] - 77 y.o. female MRN [id number]  Date of birth: [birth year]  Office Visit Note: Visit Date: 10/19/2022 PCP: Jackie Plum, MD Referred by: Jackie Plum, MD  Subjective: Chief Complaint  Patient presents with   Neck - Pain   HPI:  [name] is a 77 y.o. female who comes in today at the request of Dr. Burnard Bunting for planned Right C7-T1 Cervical Interlaminar epidural steroid injection with fluoroscopic guidance.  The patient has failed conservative care including home exercise, medications, time and activity modification.  This injection will be diagnostic and hopefully therapeutic.  Please see requesting physician notes for further details and justification.   ROS Otherwise per HPI.  Assessment & Plan: Visit Diagnoses:    ICD-10-CM   1. Cervical radiculopathy  M54.12 XR C-ARM NO REPORT    Epidural Steroid injection    methylPREDNISolone acetate (DEPO-MEDROL) injection 80 mg      Plan: No additional findings.   Meds & Orders:  Meds ordered this encounter  Medications   methylPREDNISolone acetate (DEPO-MEDROL) injection 80 mg    Orders Placed This Encounter  Procedures   XR C-ARM NO REPORT   Epidural Steroid injection    Follow-up: Return for visit to requesting provider as needed.   Procedures: No procedures performed  Cervical Epidural Steroid Injection - Interlaminar Approach with Fluoroscopic Guidance  Patient: [name]      Date of Birth: [birth year] MRN: [id number] PCP: Jackie Plum, MD      Visit Date: 10/19/2022   Universal Protocol:    Date/Time: 06/24/248:11 PM  Consent Given By: the patient  Position: PRONE  Additional Comments: Vital signs were monitored before and after the procedure. Patient was prepped and draped in the usual sterile fashion. The correct patient, procedure, and site was verified.   Injection Procedure Details:   Procedure diagnoses: Cervical radiculopathy [M54.12]    Meds  Administered:  Meds ordered this encounter  Medications   methylPREDNISolone acetate (DEPO-MEDROL) injection 80 mg     Laterality: Right  Location/Site: C7-T1  Needle: 3.5 in., 20 ga. Tuohy  Needle Placement: Paramedian epidural space  Findings:  -Comments: Excellent flow of contrast into the epidural space.  Procedure Details: Using a paramedian approach from the side mentioned above, the region overlying the inferior lamina was localized under fluoroscopic visualization and the soft tissues overlying this structure were infiltrated with 4 ml. of 1% Lidocaine without Epinephrine. A # 20 gauge, Tuohy needle was inserted into the epidural space using a paramedian approach.  The epidural space was localized using loss of resistance along with contralateral oblique bi-planar fluoroscopic views.  After negative aspirate for air, blood, and CSF, a 2 ml. volume of Isovue-250 was injected into the epidural space and the flow of contrast was observed. Radiographs were obtained for documentation purposes.   The injectate was administered into the level noted above.  Additional Comments:  No complications occurred Dressing: 2 x 2 sterile gauze and Band-Aid    Post-procedure details: Patient was observed during the procedure. Post-procedure instructions were reviewed.  Patient left the clinic in stable condition.   Clinical History: MRI CERVICAL SPINE WITHOUT CONTRAST   TECHNIQUE: Multiplanar, multisequence MR imaging of the cervical spine was performed. No intravenous contrast was administered.   COMPARISON:  11/20/2013   FINDINGS: Patient motion degrades image quality limiting evaluation.   Alignment: 3 mm anterolisthesis of C4 on C5.   Vertebrae: No acute fracture, evidence of discitis, or aggressive bone lesion.  C4 vertebral body hemangioma.   Cord: Normal signal and morphology.   Posterior Fossa, vertebral arteries, paraspinal tissues: Posterior fossa demonstrates no  focal abnormality. Vertebral artery flow voids are maintained. Paraspinal soft tissues are unremarkable.   Disc levels:   Discs: Degenerative disease with disc height loss throughout the cervical and upper thoracic spine.   C2-3: Broad-based disc bulge. Mild bilateral facet arthropathy. No left foraminal stenosis. Severe right foraminal stenosis. Mild spinal stenosis.   C3-4: Broad-based disc bulge with a broad central disc protrusion. Moderate right and mild left facet arthropathy. Mild left foraminal stenosis. Severe right foraminal stenosis. Severe spinal stenosis.   C4-5: No disc protrusion. Moderate bilateral facet arthropathy. Bilateral uncovertebral degenerative changes. Severe bilateral foraminal stenosis. Moderate spinal stenosis.   C5-6: Broad-based disc bulge. Mild right facet arthropathy. Bilateral uncovertebral degenerative changes. Severe bilateral foraminal stenosis. No spinal stenosis.   C6-7: Broad-based disc osteophyte complex. Bilateral uncovertebral degenerative changes. Mild right foraminal stenosis. Severe left foraminal stenosis. Mild spinal stenosis.   C7-T1: Mild broad-based disc bulge. No foraminal or central canal stenosis.   On the sagittal images there broad-based disc bulges at T1-2, T2-3, T3-4 and T4-5. Severe left and moderate right foraminal stenosis at T1-2. Moderate bilateral foraminal stenosis at T2-3.   IMPRESSION: 1. Diffuse cervical spine spondylosis as described above. 2. No acute osseous injury of the cervical spine.     Electronically Signed   By: Elige Ko M.D.   On: 09/18/2022 11:13     Objective:  VS:  HT:    WT:   BMI:     BP:(!) 148/60  HR:74bpm  TEMP: ( )  RESP:  Physical Exam Vitals and nursing note reviewed.  Constitutional:      General: She is not in acute distress.    Appearance: Normal appearance. She is not ill-appearing.  HENT:     Head: Normocephalic and atraumatic.     Right Ear: External ear  normal.     Left Ear: External ear normal.  Eyes:     Extraocular Movements: Extraocular movements intact.  Cardiovascular:     Rate and Rhythm: Normal rate.     Pulses: Normal pulses.  Pulmonary:     Effort: Pulmonary effort is normal. No respiratory distress.  Abdominal:     General: There is no distension.     Palpations: Abdomen is soft.  Musculoskeletal:        General: Tenderness present.     Cervical back: Neck supple.     Right lower leg: No edema.     Left lower leg: No edema.     Comments: Patient has good distal strength with no pain over the greater trochanters.  No clonus or focal weakness.  Skin:    Findings: No erythema, lesion or rash.  Neurological:     General: No focal deficit present.     Mental Status: She is alert and oriented to person, place, and time.     Sensory: No sensory deficit.     Motor: No weakness or abnormal muscle tone.     Coordination: Coordination normal.  Psychiatric:        Mood and Affect: Mood normal.        Behavior: Behavior normal.      Imaging: No results found.

## 2022-10-30 NOTE — Procedures (Signed)
Cervical Epidural Steroid Injection - Interlaminar Approach with Fluoroscopic Guidance  Patient: [name]      Date of Birth: 06/25/1945 MRN: [id number] PCP: Jackie Plum, MD      Visit Date: 10/19/2022   Universal Protocol:    Date/Time: 06/24/248:11 PM  Consent Given By: the patient  Position: PRONE  Additional Comments: Vital signs were monitored before and after the procedure. Patient was prepped and draped in the usual sterile fashion. The correct patient, procedure, and site was verified.   Injection Procedure Details:   Procedure diagnoses: Cervical radiculopathy [M54.12]    Meds Administered:  Meds ordered this encounter  Medications   methylPREDNISolone acetate (DEPO-MEDROL) injection 80 mg     Laterality: Right  Location/Site: C7-T1  Needle: 3.5 in., 20 ga. Tuohy  Needle Placement: Paramedian epidural space  Findings:  -Comments: Excellent flow of contrast into the epidural space.  Procedure Details: Using a paramedian approach from the side mentioned above, the region overlying the inferior lamina was localized under fluoroscopic visualization and the soft tissues overlying this structure were infiltrated with 4 ml. of 1% Lidocaine without Epinephrine. A # 20 gauge, Tuohy needle was inserted into the epidural space using a paramedian approach.  The epidural space was localized using loss of resistance along with contralateral oblique bi-planar fluoroscopic views.  After negative aspirate for air, blood, and CSF, a 2 ml. volume of Isovue-250 was injected into the epidural space and the flow of contrast was observed. Radiographs were obtained for documentation purposes.   The injectate was administered into the level noted above.  Additional Comments:  No complications occurred Dressing: 2 x 2 sterile gauze and Band-Aid    Post-procedure details: Patient was observed during the procedure. Post-procedure instructions were  reviewed.  Patient left the clinic in stable condition.

## 2022-11-02 ENCOUNTER — Encounter: Payer: Self-pay | Admitting: Orthopedic Surgery

## 2022-11-02 ENCOUNTER — Ambulatory Visit (INDEPENDENT_AMBULATORY_CARE_PROVIDER_SITE_OTHER): Payer: 59 | Admitting: Orthopedic Surgery

## 2022-11-02 DIAGNOSIS — M5412 Radiculopathy, cervical region: Secondary | ICD-10-CM | POA: Diagnosis not present

## 2022-11-02 NOTE — Progress Notes (Signed)
Office Visit Note   Patient: [name]           Date of Birth: February 16, 1946           MRN: [id number] Visit Date: 11/02/2022 Requested by: Jackie Plum, MD [address] [place],  [place] [address] PCP: Jackie Plum, MD  Subjective: Chief Complaint  Patient presents with   Other    Recheck neck     HPI: [name] is a 77 y.o. female who presents to the office reporting significant improvement in her neck shoulder pain and walking ability following cervical spine ESI 10/19/2022.  She states that she had 100% relief from this neck injection for about 3 and half days.  Now the pain is slowly returning.  She states she was walking better after the shot.  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  Considering reverse replacement for this patient.  She states however that the neck injection helped her arm significantly and that with that type of relief she could live with her shoulder function the way it is currently.  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                ROS: All systems reviewed are negative as they relate to the chief complaint within the history of present illness.  Patient denies fevers or chills.  Assessment & Plan: Visit Diagnoses:  1. Cervical radiculopathy     Plan: Impression is very good relief from cervical spine surgery.  I think would be good to get Dr. Christell Constant to weigh in on appropriateness of cervical spine surgery in this patient.  I think reverse replacement could be done but I do not think it is going to really address the majority of her symptoms as demonstrated by the significant relief she did obtain from the cervical spine injection.  We will set up that referral for Dr. Christell Constant in the near future.  Patient understands and her advocate/caregiver also  understand.  Follow-Up Instructions: No follow-ups on file.   Orders:  Orders Placed This Encounter  Procedures   Ambulatory referral to Orthopedic Surgery   No orders of the defined types were placed in this encounter.     Procedures: No procedures performed   Clinical Data: No additional findings.  Objective: Vital Signs: There were no vitals taken for this visit.  Physical Exam:  Constitutional: Patient appears well-developed HEENT:  Head: Normocephalic Eyes:EOM are normal Neck: Normal range of motion Cardiovascular: Normal rate Pulmonary/chest: Effort normal Neurologic: Patient is alert Skin: Skin is warm Psychiatric: Patient has normal mood and affect  Ortho Exam: Ortho exam demonstrates negative Hoffman in the upper extremities and negative clonus in the bilateral lower extremities.  Patient has diminished passive and active shoulder range of motion in the right arm which is unchanged.  Deltoid is functional.  Neck range of motion mildly tender with about 10 to 15% restriction of range of motion of flexion extension and rotation.  Patient does report some paresthesias in the right hand digits 3 and 4.  No muscle atrophy in the right arm.  Specialty Comments:  MRI CERVICAL SPINE WITHOUT CONTRAST   TECHNIQUE: Multiplanar, multisequence MR imaging of the cervical spine was performed. No intravenous contrast was administered.   COMPARISON:  11/20/2013   FINDINGS: Patient motion degrades image  quality limiting evaluation.   Alignment: 3 mm anterolisthesis of C4 on C5.   Vertebrae: No acute fracture, evidence of discitis, or aggressive bone lesion. C4 vertebral body hemangioma.   Cord: Normal signal and morphology.   Posterior Fossa, vertebral arteries, paraspinal tissues: Posterior fossa demonstrates no focal abnormality. Vertebral artery flow voids are maintained. Paraspinal soft tissues are unremarkable.   Disc levels:   Discs: Degenerative disease  with disc height loss throughout the cervical and upper thoracic spine.   C2-3: Broad-based disc bulge. Mild bilateral facet arthropathy. No left foraminal stenosis. Severe right foraminal stenosis. Mild spinal stenosis.   C3-4: Broad-based disc bulge with a broad central disc protrusion. Moderate right and mild left facet arthropathy. Mild left foraminal stenosis. Severe right foraminal stenosis. Severe spinal stenosis.   C4-5: No disc protrusion. Moderate bilateral facet arthropathy. Bilateral uncovertebral degenerative changes. Severe bilateral foraminal stenosis. Moderate spinal stenosis.   C5-6: Broad-based disc bulge. Mild right facet arthropathy. Bilateral uncovertebral degenerative changes. Severe bilateral foraminal stenosis. No spinal stenosis.   C6-7: Broad-based disc osteophyte complex. Bilateral uncovertebral degenerative changes. Mild right foraminal stenosis. Severe left foraminal stenosis. Mild spinal stenosis.   C7-T1: Mild broad-based disc bulge. No foraminal or central canal stenosis.   On the sagittal images there broad-based disc bulges at T1-2, T2-3, T3-4 and T4-5. Severe left and moderate right foraminal stenosis at T1-2. Moderate bilateral foraminal stenosis at T2-3.   IMPRESSION: 1. Diffuse cervical spine spondylosis as described above. 2. No acute osseous injury of the cervical spine.     Electronically Signed   By: Elige Ko M.D.   On: 09/18/2022 11:13  Imaging: No results found.   PMFS History: Patient Active Problem List   Diagnosis Date Noted   Moderate persistent asthma with acute exacerbation 12/05/2017   Perennial allergic rhinitis 09/20/2017   MDD (major depressive disorder) 07/31/2017   Insomnia 02/23/2015   OSA (obstructive sleep apnea) 02/23/2015   Asthma 02/23/2015   Chest pain on exertion 04/10/2013   Menopausal hot flushes 01/22/2013   Frequency of urination 01/22/2013   Past Medical History:  Diagnosis Date    Allergy    Arthritis    Asthma    CAD (coronary artery disease)    Chronic headaches    Depression    Diabetes mellitus, type II (HCC)    Diverticulosis    Endometriosis    GERD (gastroesophageal reflux disease)    Heart attack (HCC)    High cholesterol    Hypertension    Mallory-Weiss tear 2003   Mood swings    Rheumatoid arthritis (HCC)    Vitamin D deficiency     Family History  Problem Relation Age of Onset   Cancer Mother    Heart disease Mother    Cancer Father    Heart disease Father     Past Surgical History:  Procedure Laterality Date   ABDOMINAL HYSTERECTOMY     EXPLORATORY LAPAROTOMY     I & D EXTREMITY Left 08/04/2017   Procedure: IRRIGATION AND DEBRIDEMENT HAND;  Surgeon: Betha Loa, MD;  Location: [facility] OR;  Service: Orthopedics;  Laterality: Left;   LEFT HEART CATHETERIZATION WITH CORONARY ANGIOGRAM N/A 04/10/2013   Procedure: LEFT HEART CATHETERIZATION WITH CORONARY ANGIOGRAM;  Surgeon: Ricki Rodriguez, MD;  Location: [facility] CATH [facility];  Service: Cardiovascular;  Laterality: N/A;   Social History   Occupational History   Not on file  Tobacco Use   Smoking status: Former    Types: Cigarettes  Smokeless tobacco: Never  Vaping Use   Vaping Use: Never used  Substance and Sexual Activity   Alcohol use: Yes    Comment: occasional   Drug use: No   Sexual activity: Never

## 2022-11-07 ENCOUNTER — Telehealth: Payer: Self-pay

## 2022-11-07 NOTE — Telephone Encounter (Signed)
Left vmail unable to send mychart message

## 2022-11-10 NOTE — Progress Notes (Signed)
Cardiology Office Note:    Date:  11/13/2022   ID:  [name], DOB 27-Feb-1946, MRN [id number]  PCP:  Jackie Plum, MD   [facility] Providers Cardiologist:  Meriam Sprague, MD   Referring MD: Jackie Plum, MD    History of Present Illness:    [name] is a 77 y.o. female with a hx of OSA, asthma, HTN and depression who presents to clinic for follow-up.   Initially seen in 02/2022 due to atypical chest pain and lower extremity edema.  Prior LHC in 2014 with no coronary atherosclerosis.  Echocardiogram 02/22/2022 normal LVEF, normal diastolic parameters, RV normal, trivial MR.   Cardiac CTA 07/11/2022 with aortic atherosclerosis, coronary calcium score of 65 placing her in the 64th percentile.  Overall minimal nonobstructive CAD (ostial LAD and circumflex 0 to 24% stenosis) with finding of small PFO.  She was recommended to start aspirin 81 mg daily rosuvastatin 5 mg daily.  Was last seen in clinic by Gillian Shields on 07/31/22 where she was stable from a CV standpoint. BP elevated to 140-150s. Metoprolol was increased to 50mg  daily.  Today, the patient overall feels well. Continues to have small amount of chest pain after she eats lunch. No exertional chest pain. She admits she is not as active as she used to be and has joint pain with exertion. Continues to have dyspnea on exertion which she attributes to her asthma. No orthopnea or PND. Has occasional LE edema that has since improved. Blood pressure has been 130-150s/80s today.    Past Medical History:  Diagnosis Date   Allergy    Arthritis    Asthma    CAD (coronary artery disease)    Chronic headaches    Depression    Diabetes mellitus, type II (HCC)    Diverticulosis    Endometriosis    GERD (gastroesophageal reflux disease)    Heart attack (HCC)    High cholesterol    Hypertension    Mallory-Weiss tear 2003   Mood swings    Rheumatoid arthritis (HCC)    Vitamin D deficiency      Past Surgical History:  Procedure Laterality Date   ABDOMINAL HYSTERECTOMY     EXPLORATORY LAPAROTOMY     I & D EXTREMITY Left 08/04/2017   Procedure: IRRIGATION AND DEBRIDEMENT HAND;  Surgeon: Betha Loa, MD;  Location: [facility] OR;  Service: Orthopedics;  Laterality: Left;   LEFT HEART CATHETERIZATION WITH CORONARY ANGIOGRAM N/A 04/10/2013   Procedure: LEFT HEART CATHETERIZATION WITH CORONARY ANGIOGRAM;  Surgeon: Ricki Rodriguez, MD;  Location: [facility];  Service: Cardiovascular;  Laterality: N/A;    Current Medications: Current Meds  Medication Sig   albuterol (VENTOLIN HFA) 108 (90 Base) MCG/ACT inhaler Inhale 2 puffs into the lungs every 6 (six) hours as needed for wheezing or shortness of breath.   amLODipine (NORVASC) 2.5 MG tablet Take 1 tablet (2.5 mg total) by mouth daily.   aspirin EC 81 MG tablet Take 1 tablet (81 mg total) by mouth daily. Swallow whole.   buPROPion (WELLBUTRIN XL) 150 MG 24 hr tablet Take 150 mg by mouth every morning.   esomeprazole (NEXIUM) 40 MG capsule Take 1 capsule (40 mg total) by mouth daily at 12 noon.   fluticasone (FLONASE) 50 MCG/ACT nasal spray Place 2 sprays into both nostrils daily.   gabapentin (NEURONTIN) 400 MG capsule Take 400 mg by mouth 3 (three) times daily.   lidocaine (LIDODERM) 5 % Place 1 patch onto  the skin daily as needed. Apply patch to area most significant pain once per day.  Remove and discard patch within 12 hours of application.   meclizine (ANTIVERT) 12.5 MG tablet Take 1 tablet (12.5 mg total) by mouth 3 (three) times daily as needed for dizziness.   metoprolol succinate (TOPROL-XL) 50 MG 24 hr tablet Take 1 tablet (50 mg total) by mouth daily.   montelukast (SINGULAIR) 10 MG tablet Take 1 tablet (10 mg total) by mouth at bedtime.   nitroGLYCERIN (NITROSTAT) 0.3 MG SL tablet Place 0.3 mg under the tongue every 5 (five) minutes as needed for chest pain.   QUEtiapine (SEROQUEL) 25 MG tablet Take 25 mg by mouth at bedtime.    rosuvastatin (CRESTOR) 5 MG tablet Take 1 tablet (5 mg total) by mouth daily.   traMADol (ULTRAM) 50 MG tablet Take 1 tablet (50 mg total) by mouth every 8 (eight) hours as needed.   Vitamin D, Ergocalciferol, (DRISDOL) 1.25 MG (50000 UNIT) CAPS capsule Take 50,000 Units by mouth once a week.     Allergies:   Patient has no known allergies.   Social History   Socioeconomic History   Marital status: Divorced    Spouse name: Not on file   Number of children: Not on file   Years of education: Not on file   Highest education level: Not on file  Occupational History   Not on file  Tobacco Use   Smoking status: Former    Types: Cigarettes   Smokeless tobacco: Never  Vaping Use   Vaping Use: Never used  Substance and Sexual Activity   Alcohol use: Yes    Comment: occasional   Drug use: No   Sexual activity: Never  Other Topics Concern   Not on file  Social History Narrative   Not on file   Social Determinants of Health   Financial Resource Strain: Not on file  Food Insecurity: Not on file  Transportation Needs: Not on file  Physical Activity: Not on file  Stress: Not on file  Social Connections: Not on file     Family History: The patient's family history includes Cancer in her father and mother; Heart disease in her father and mother.  ROS:   As per HPI   EKGs/Labs/Other Studies Reviewed:    The following studies were reviewed today: Cardiac Studies & Procedures       ECHOCARDIOGRAM  ECHOCARDIOGRAM COMPLETE 02/22/2022  Narrative ECHOCARDIOGRAM REPORT    Patient Name:   [name] Date of Exam: 02/21/2022 Medical Rec #:  [id number]        Height:       62.0 in Accession #:    [id number]       Weight:       168.6 lb Date of Birth:  1945/08/21       BSA:          1.778 m Patient Age:    75 years         BP:           132/80 mmHg Patient Gender: F                HR:           79 bpm. Exam Location:  [address]  Procedure: 2D Echo, Cardiac  Doppler and Color Doppler  Indications:    R60.0 Edema  History:        Patient has no prior history of Echocardiogram examinations. Risk Factors:Hypertension.  Sonographer:    Daphine Deutscher RDCS Referring Phys: [id number] Bannon Giammarco E Hayslee Casebolt  IMPRESSIONS   1. Left ventricular ejection fraction, by estimation, is 55 to 60%. The left ventricle has normal function. The left ventricle has no regional wall motion abnormalities. Left ventricular diastolic parameters were normal. 2. Right ventricular systolic function is normal. The right ventricular size is normal. There is normal pulmonary artery systolic pressure. 3. The mitral valve is normal in structure. Trivial mitral valve regurgitation. No evidence of mitral stenosis. 4. The aortic valve is tricuspid. Aortic valve regurgitation is not visualized. No aortic stenosis is present. 5. The inferior vena cava is normal in size with greater than 50% respiratory variability, suggesting right atrial pressure of 3 mmHg.  Comparison(s): No prior Echocardiogram.  Conclusion(s)/Recommendation(s): Normal biventricular function without evidence of hemodynamically significant valvular heart disease.  FINDINGS Left Ventricle: Left ventricular ejection fraction, by estimation, is 55 to 60%. The left ventricle has normal function. The left ventricle has no regional wall motion abnormalities. The left ventricular internal cavity size was normal in size. There is no left ventricular hypertrophy. Left ventricular diastolic parameters were normal.  Right Ventricle: The right ventricular size is normal. No increase in right ventricular wall thickness. Right ventricular systolic function is normal. There is normal pulmonary artery systolic pressure. The tricuspid regurgitant velocity is 2.47 m/s, and with an assumed right atrial pressure of 3 mmHg, the estimated right ventricular systolic pressure is 27.4 mmHg.  Left Atrium: Left atrial size was normal  in size.  Right Atrium: Right atrial size was normal in size.  Pericardium: There is no evidence of pericardial effusion.  Mitral Valve: The mitral valve is normal in structure. Trivial mitral valve regurgitation. No evidence of mitral valve stenosis.  Tricuspid Valve: The tricuspid valve is normal in structure. Tricuspid valve regurgitation is trivial. No evidence of tricuspid stenosis.  Aortic Valve: The aortic valve is tricuspid. Aortic valve regurgitation is not visualized. No aortic stenosis is present.  Pulmonic Valve: The pulmonic valve was not well visualized. Pulmonic valve regurgitation is not visualized. No evidence of pulmonic stenosis.  Aorta: The aortic root, ascending aorta, aortic arch and descending aorta are all structurally normal, with no evidence of dilitation or obstruction.  Venous: The inferior vena cava is normal in size with greater than 50% respiratory variability, suggesting right atrial pressure of 3 mmHg.  IAS/Shunts: No atrial level shunt detected by color flow Doppler.   LEFT VENTRICLE PLAX 2D LVIDd:         4.00 cm   Diastology LVIDs:         2.80 cm   LV e' medial:    8.09 cm/s LV PW:         0.90 cm   LV E/e' medial:  8.9 LV IVS:        0.90 cm   LV e' lateral:   8.52 cm/s LVOT diam:     2.00 cm   LV E/e' lateral: 8.5 LV SV:         85 LV SV Index:   48 LVOT Area:     3.14 cm   RIGHT VENTRICLE             IVC RV Basal diam:  3.50 cm     IVC diam: 1.60 cm RV S prime:     17.15 cm/s TAPSE (M-mode): 2.4 cm  LEFT ATRIUM             Index  RIGHT ATRIUM           Index LA diam:        3.70 cm 2.08 cm/m   RA Area:     11.10 cm LA Vol (A2C):   40.9 ml 23.01 ml/m  RA Volume:   27.00 ml  15.19 ml/m LA Vol (A4C):   33.8 ml 19.01 ml/m LA Biplane Vol: 38.4 ml 21.60 ml/m AORTIC VALVE LVOT Vmax:   124.50 cm/s LVOT Vmean:  87.050 cm/s LVOT VTI:    0.271 m  AORTA Ao Root diam: 3.50 cm Ao Asc diam:  3.20 cm  MITRAL VALVE                 TRICUSPID VALVE MV Area (PHT): 4.10 cm     TR Peak grad:   24.4 mmHg MV Decel Time: 185 msec     TR Vmax:        247.00 cm/s MV E velocity: 72.30 cm/s MV A velocity: 101.90 cm/s  SHUNTS MV E/A ratio:  0.71         Systemic VTI:  0.27 m Systemic Diam: 2.00 cm  Jodelle Red MD Electronically signed by Jodelle Red MD Signature Date/Time: 02/22/2022/8:22:30 AM    Final     CT SCANS  CT CORONARY MORPH W/CTA COR W/SCORE 07/12/2022  Addendum 07/12/2022  7:41 PM ADDENDUM REPORT: 07/12/2022 19:38  EXAM: OVER-READ INTERPRETATION  CT CHEST  The following report is an over-read performed by radiologist Dr. Alcide Clever of [facility] on 07/12/2022. This over-read does not include interpretation of cardiac or coronary anatomy or pathology. The coronary calcium score/coronary CTA interpretation by the cardiologist is attached.  COMPARISON:  None.  FINDINGS: Cardiovascular: Atherosclerotic calcifications of the thoracic aorta are noted without aneurysmal dilatation. Pulmonary artery shows no evidence of pulmonary embolism.  Mediastinum/Nodes: There are no enlarged lymph nodes within the visualized mediastinum.Esophagus as visualized is within normal limits.  Lungs/Pleura: There is no pleural effusion. The visualized lungs appear clear.  Upper abdomen: Visualized upper abdomen is within normal limits.  Musculoskeletal/Chest wall: No chest wall mass or suspicious osseous findings within the visualized chest.  IMPRESSION: Aortic Atherosclerosis (ICD10-I70.0).  No other significant extracardiac findings are noted.   Electronically Signed By: Alcide Clever M.D. On: 07/12/2022 19:38  Narrative CLINICAL DATA:  77 year old with chest pain  EXAM: Cardiac/Coronary  CTA  TECHNIQUE: The patient was scanned on a [employer].  FINDINGS: A 120 kV prospective scan was triggered in the descending thoracic aorta at 111 HU's. Axial  non-contrast 3 mm slices were carried out through the heart. The data set was analyzed on a dedicated work station and scored using the Agatson method. Gantry rotation speed was 250 msecs and collimation was .6 mm. 0.8 mg of sl NTG was given. The 3D data set was reconstructed in 5% intervals of the 67-82 % of the R-R cycle. Diastolic phases were analyzed on a dedicated work station using MPR, MIP and VRT modes. The patient received 80 cc of contrast.  Image quality: Fair, misregistration artifact, however interpretable.  Aorta: Normal size (34 mm ascending). No calcifications. No dissection.  Aortic Valve:  Trileaflet.  No calcifications.  Coronary Arteries:  Normal coronary origin.  Right dominance.  RCA is a large dominant artery that gives rise to PDA and PLA. There is no plaque. PDA is small caliber and underfilled.  Left main is a large artery that gives rise to LAD and LCX arteries.  LAD  is a large vessel that has ostial calcified plaque, 0-24% stenosis.  D1: Small under filled.  LCX is a non-dominant artery. There is ostial calcified plaque, 0-24% stenosis.  OM1, 2, 3 are small in caliber, no plaque  Other findings:  Normal pulmonary vein drainage into the left atrium.  Normal left atrial appendage without a thrombus.  Normal size of the pulmonary artery.  Please see radiology report for non cardiac findings.  IMPRESSION: 1. Coronary calcium score of 65. This was 33 percentile for age and sex matched control.  2. Normal coronary origin with right dominance.  3. Minimal non obstructive CAD (Ostial LAD and Circumflex calcified plaque, 0-24% stenosis).  4. Small PFO  CAD-RADS 1. Minimal non-obstructive CAD (0-24%). Consider non-atherosclerotic causes of chest pain. Consider preventive therapy and risk factor modification.  Electronically Signed: By: Donato Schultz M.D. On: 07/11/2022 11:32           EKG:  EKG is personally reviewed. NSR, nonspecific  T wave changes, HR 90bpm   Recent Labs: 02/08/2022: NT-Pro BNP 76 05/31/2022: ALT 11 07/03/2022: BNP 48.0; BUN 16; Creatinine, Ser 0.93; Hemoglobin 13.5; Platelets 284; Potassium 4.4; Sodium 141   Recent Lipid Panel    Component Value Date/Time   CHOL 157 02/08/2022 1021   TRIG 105 02/08/2022 1021   HDL 77 02/08/2022 1021   CHOLHDL 2.0 02/08/2022 1021   LDLCALC 61 02/08/2022 1021     Risk Assessment/Calculations:                Physical Exam:    VS:  BP 138/86   Pulse (!) 105   Ht 5\' 1"  (1.549 m)   Wt 175 lb 6.4 oz (79.6 kg)   SpO2 96%   BMI 33.14 kg/m     Wt Readings from Last 3 Encounters:  11/13/22 175 lb 6.4 oz (79.6 kg)  07/31/22 171 lb 12.8 oz (77.9 kg)  07/03/22 176 lb (79.8 kg)     GEN: Comfortable HEENT: Normal NECK: No JVD; No carotid bruits CARDIAC: RRR, 2/6 systolic murmur. No rubs or gallops RESPIRATORY:  Clear to auscultation without rales, wheezing or rhonchi  ABDOMEN: Soft, non-tender, non-distended MUSCULOSKELETAL:  Warm, trace edema. SKIN: Warm and dry NEUROLOGIC:  Alert and oriented x 3 PSYCHIATRIC:  Normal affect   ASSESSMENT:    1. Chest pain on exertion   2. Essential hypertension   3. Coronary artery disease of native artery of native heart with stable angina pectoris (HCC)   4. Hyperlipidemia LDL goal <70   5. Primary hypertension   6. Bilateral lower extremity edema    PLAN:    In order of problems listed above:  #Coronary Artery Disease: -Coronary CTA with minimal nonobstructive CAD -Continue ASA 81mg  daily -Continue crestor 5mg  daily  #HTN: Running mainly high 130-140s at home -Increae amlodipine to 5mg  daily -Continue metop 50mg  XL daily  #LE Edema: Chronic and stable. TTE 02/2022 with EF 55-60%, normal diastology, normal RV, trivial MR -Continue lasix 20mg  daily       Follow-up:  6 months.  Medication Adjustments/Labs and Tests Ordered: Current medicines are reviewed at length with the patient today.   Concerns regarding medicines are outlined above.   Orders Placed This Encounter  Procedures   EKG 12-Lead   No orders of the defined types were placed in this encounter.  There are no Patient Instructions on file for this visit.    Signed, Meriam Sprague, MD  11/13/2022 11:02 AM    [facility]

## 2022-11-13 ENCOUNTER — Ambulatory Visit: Payer: 59 | Attending: Cardiology | Admitting: Cardiology

## 2022-11-13 ENCOUNTER — Encounter: Payer: Self-pay | Admitting: Cardiology

## 2022-11-13 VITALS — BP 138/86 | HR 105 | Ht 61.0 in | Wt 175.4 lb

## 2022-11-13 DIAGNOSIS — E785 Hyperlipidemia, unspecified: Secondary | ICD-10-CM | POA: Diagnosis not present

## 2022-11-13 DIAGNOSIS — R079 Chest pain, unspecified: Secondary | ICD-10-CM

## 2022-11-13 DIAGNOSIS — I10 Essential (primary) hypertension: Secondary | ICD-10-CM | POA: Diagnosis not present

## 2022-11-13 DIAGNOSIS — I25118 Atherosclerotic heart disease of native coronary artery with other forms of angina pectoris: Secondary | ICD-10-CM | POA: Diagnosis not present

## 2022-11-13 DIAGNOSIS — R600 Localized edema: Secondary | ICD-10-CM

## 2022-11-13 MED ORDER — AMLODIPINE BESYLATE 5 MG PO TABS: 5.0000 mg | ORAL_TABLET | Freq: Every day | ORAL | 3 refills | Status: DC

## 2022-11-13 NOTE — Patient Instructions (Signed)
Medication Instructions:   INCREASE YOUR AMLODIPINE TO 5 MG BY MOUTH DAILY  *If you need a refill on your cardiac medications before your next appointment, please call your pharmacy*    Follow-Up: At [facility], you and your health needs are our priority.  As part of our continuing mission to provide you with exceptional heart care, we have created designated Provider Care Teams.  These Care Teams include your primary Cardiologist (physician) and Advanced Practice Providers (APPs -  Physician Assistants and Nurse Practitioners) who all work together to provide you with the care you need, when you need it.  We recommend signing up for the patient portal called "MyChart".  Sign up information is provided on this After Visit Summary.  MyChart is used to connect with patients for Virtual Visits (Telemedicine).  Patients are able to view lab/test results, encounter notes, upcoming appointments, etc.  Non-urgent messages can be sent to your provider as well.   To learn more about what you can do with MyChart, go to [email or web address].    Your next appointment:   6 month(s)  Provider:   DR. Tenny Craw

## 2022-11-22 ENCOUNTER — Ambulatory Visit: Payer: 59 | Admitting: Obstetrics and Gynecology

## 2022-11-23 ENCOUNTER — Other Ambulatory Visit (INDEPENDENT_AMBULATORY_CARE_PROVIDER_SITE_OTHER): Payer: 59

## 2022-11-23 ENCOUNTER — Ambulatory Visit: Payer: 59 | Admitting: Orthopedic Surgery

## 2022-11-23 ENCOUNTER — Encounter: Payer: Self-pay | Admitting: Orthopedic Surgery

## 2022-11-23 VITALS — BP 112/77 | HR 97 | Ht 61.0 in | Wt 175.0 lb

## 2022-11-23 DIAGNOSIS — M5412 Radiculopathy, cervical region: Secondary | ICD-10-CM

## 2022-11-23 NOTE — Progress Notes (Signed)
Orthopedic Spine Surgery Office Note  Assessment: Patient is a 77 y.o. female with cervical myeloradiculopathy   Plan: -Patient has symptoms and exam findings consistent with myelopathy and central stenosis at C3/4, so discussed operative management after going over the natural history of stepwise decline seen with myelopathy -She notes that she is had difficulty with swallowing in the last year.  Due to that and her advanced age, discussed posterior surgery as a result.  I told her that I do not think her symptoms are coming from the C6/7 foraminal stenosis since none of her pain radiates past the elbow.  However, I also told her that stopping the construct at C7 when there is already DDD puts her at risk of reoperation for symptoms at that level in the near future.  For that reason, recommended spanning the cervical thoracic junction.  After discussing surgery, patient elected to proceed -Explained to her that some of her pain is likely coming from the shoulder as she has pain with motion on exam today and has significant degenerative findings on her CT scan.  Accordingly, I did not expect her shoulder pain to completely resolve with the proposed surgery -Patient will next be seen at the date of surgery   The patient has signs and symptoms consistent with cervical myeloradiculopathy. The most common natural history of cervical myelopathy was explained to the patient. Given this course, operative management in the form of laminectomies with foraminotomies at the C2, C3, C4, C5 segments, bilateral foraminotomies at C5/6, C2-T2 posterior instrumented spinal fusion was discussed with the patient. The risks, including but not limited to pseudarthrosis, durotomy, spinal cord injury, nerve root injury, C5 nerve palsy, persistent pain, adjacent segment disease, infection, bleeding, hardware failure, vascular injury, heart attack, death, stroke, blindness, fracture, and need for additional procedures were  discussed with the patient.  I told her that the surgery would be prolonged and that with her age her chance for complication would be higher.  The benefit of surgery would be preventing progression of the myelopathy and not to reverse any myelopathic symptoms. Explained that another goal of surgery would be to help with her radiating arm pain, but that surgery may not get her full relief of her pain, especially any neck pain.  As mentioned above, I also told her that she has significant shoulder arthritis and the surgery would not address that pain.  The alternatives to surgical management were covered with the patient and included continued monitoring, physical therapy, over-the-counter pain medications, ambulatory aids, and activity modification. I told her that these modalities would not change the natural history of myelopathy and that is why operative management has been recommended. All the patient's questions were answered to her satisfaction. After this discussion, the patient expressed understanding and elected to proceed with surgical intervention.     ___________________________________________________________________________   History:  Patient is a 77 y.o. female who presents today for cervical spine.  Patient has had neck pain that radiates into the bilateral shoulders for the last 9 months or so.  There is no trauma or injury that preceded the onset of pain.  Her pain starts in the neck and goes into the lateral aspect of both shoulders.  She also has pain radiating into the right upper extremity to the level of the elbow.  It does not radiate past the elbow.  She has seen my partner Dr. August Saucer about her shoulder.  He had talked about a reverse shoulder arthroplasty with her given the severe degenerative  changes noted in the shoulder.  However, she got significant relief of her arm pain with a cervical ESI, so she was not interested in reverse and wanted to see if her symptoms were coming from  the neck.  She said that it cervical injection helped her 100% with her pain but it only lasted for a couple of days.  Then, pain slowly returned.   Weakness: Yes, hands feel weaker bilaterally.  Has chronic weakness in her left upper extremity with any wrist or hand activity (childhood - erb palsy).  No other weakness noted Difficulty with fine motor skills (e.g., buttoning shirts, handwriting): Yes, has been dropping objects and has had difficulty with buttoning shirts Symptoms of imbalance: Describes lightheadedness when getting up from a seated position, but no other unsteadiness noted Paresthesias and numbness: Yes, gets numbness and paresthesias in her hands bilaterally Bowel or bladder incontinence: Denies Saddle anesthesia: Denies  Treatments tried: Tylenol, ibuprofen, activity modification  Review of systems: Denies fevers and chills, night sweats, unexplained weight loss, history of cancer.  Has had pain that wakes her at night  Past medical history: Hyperlipidemia Hypertension Coronary disease History of MI Depression/anxiety GERD COPD RA Chronic pain Sleep apnea History of stroke DM (last A1c was 5.3 on 07/03/2022)  Allergies: NKDA  Past surgical history:  Hysterectomy Exploratory laparotomy I&D left upper extremity  Social history: Denies use of nicotine product (smoking, vaping, patches, smokeless) Alcohol use: 1 drink per week Denies recreational drug use  Physical Exam:  General: no acute distress, appears stated age Neurologic: alert, answering questions appropriately, following commands Respiratory: unlabored breathing on room air, symmetric chest rise Psychiatric: appropriate affect, normal cadence to speech   MSK (spine):  -Strength exam      Left  Right Grip strength                0/5  4/5 Interosseus   0/5   4/5 Wrist extension  0/5  5/5 Wrist flexion   0/5  5/5 Elbow flexion   4/5  4/5 Deltoid    4/5  4/5  EHL    4/5  4/5 TA     5/5  5/5 GSC    5/5  5/5 Knee extension  5/5  5/5 Hip flexion   5/5  5/5  -Sensory exam    Sensation intact to light touch in L3-S1 nerve distributions of bilateral lower extremities  Sensation intact to light touch in C5-T1 nerve distributions of bilateral upper extremities  -Brachioradialis DTR: 0/4 on the left, 2/4 on the right -Biceps DTR: 1/4 on the left, 2/4 on the right -Achilles DTR: 2/4 on the left, 2/4 on the right -Patellar tendon DTR: 2/4 on the left, 2/4 on the right  -Spurling: Negative bilaterally -Hoffman sign: Positive on the right, negative on the left -Clonus: No beats bilaterally -Interosseous wasting: Seen on the left side, but not on the right -Grip and release test: Positive -Romberg: Negative -Gait: Normal  Left shoulder exam: pain with internal and external rotation, pain with jobe with weakness, negative belly press, no weakness with external rotation with arm at side, can only abduct arm to 70 degrees Right shoulder exam: no pain through range of motion, able to abduct to 90 degrees  Tinel's at wrist: Negative bilaterally Phalen's at wrist: Negative bilaterally Durkan's: Negative bilaterally  Tinel's at elbow: Negative bilaterally  Imaging: XR of the cervical spine from 11/23/2022 was independently reviewed and interpreted, showing neutral alignment.  No fracture or dislocation seen.  Facet arthropathy at multiple levels.  Disc height loss at all cervical levels.  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  MRI of the cervical spine from 09/13/2022 was independently reviewed and interpreted, showing central stenosis at the level of the C2/3 disc space.  Central stenosis at C3/4 with bilateral foraminal stenosis.  Central and bilateral foraminal stenosis at C4/5.  Bilateral foraminal stenosis at C5/6 and C6/7.  DDD at C6/7.  CT scan of the right shoulder from 03/27/2022 was independently reviewed and  interpreted, showing bone-on-bone arthritis with subchondral sclerosis, subchondral cyst formation and osteophyte formation seen.  No fracture or dislocation seen.   Patient name: [name] Patient MRN: [id number] Date of visit: 11/23/22

## 2022-11-24 ENCOUNTER — Telehealth: Payer: Self-pay

## 2022-11-24 NOTE — Telephone Encounter (Signed)
   Pre-operative Risk Assessment    Patient Name: [name]  DOB: Mar 29, 1946 MRN: [id number]      Request for Surgical Clearance    Procedure:   Cz-Tz Posterior Instrumented Spinal Fusion Laminectomies and Foraminotomies   Date of Surgery:  Clearance TBD                                 Surgeon:  Willia Craze, MD Surgeon's Group or Practice Name:  [facility] at [facility] number:  [phone] Fax number:  [phone]   Type of Clearance Requested:   - Medical  - Pharmacy:  Hold Aspirin x 7 days   Type of Anesthesia:  General    Additional requests/questions:    Garrel Ridgel   11/24/2022, 4:58 PM

## 2022-11-27 ENCOUNTER — Telehealth: Payer: Self-pay | Admitting: Physical Medicine and Rehabilitation

## 2022-11-27 ENCOUNTER — Telehealth: Payer: Self-pay | Admitting: Orthopedic Surgery

## 2022-11-27 NOTE — Telephone Encounter (Signed)
  Patient Name: [name]  DOB: 12-26-1945 MRN: [id number]  Primary Cardiologist: Meriam Sprague, MD  Chart reviewed as part of pre-operative protocol coverage. Given past medical history and time since last visit, based on ACC/AHA guidelines, [name] is at acceptable risk for the planned procedure without further cardiovascular testing. Per Dr. Shari Prows, primary cardiologist "Coronary CTA with minimal disease. No further CV testing needed prior to OR."   Per office protocol,  she may hold ASA  for 5-7 days prior to procedure. Please resume ASA  as soon as possible postprocedure, at the discretion of the surgeon.  Attempted to call patient to give directions concerning medication. Left voicemail to call back.   I will route this recommendation to the requesting party via Epic fax function and remove from pre-op pool.  Please call with questions.  Joni Reining, NP 11/27/2022, 11:02 AM

## 2022-11-27 NOTE — Telephone Encounter (Signed)
Spoke with patient via telephone this morning, she inquired about continuing injection vs surgery. She reports only 3 days of minimal relief from previous right C7-T1 interlaminar epidural steroid injection in our office on 10/19/2022. I explained to patient that we would not continue with injection therapy if she is not getting significant and sustained pain relief. She is having reservations regarding surgery. I encouraged her to speak with Dr. Christell Constant. She has no further questions at this time.

## 2022-11-27 NOTE — Telephone Encounter (Signed)
Pt wanted a call from Manual Meier, She wanted to discuss with Dr. Christell Constant about Surgery and [facility] about how often she can get injections with the process of surgery, would like a call please advise

## 2023-01-12 ENCOUNTER — Encounter: Payer: Self-pay | Admitting: Internal Medicine

## 2023-01-12 ENCOUNTER — Ambulatory Visit (INDEPENDENT_AMBULATORY_CARE_PROVIDER_SITE_OTHER): Payer: 59 | Admitting: Internal Medicine

## 2023-01-12 ENCOUNTER — Other Ambulatory Visit: Payer: Self-pay

## 2023-01-12 VITALS — BP 136/84 | HR 98 | Temp 96.8°F | Resp 18 | Ht 59.0 in | Wt 176.9 lb

## 2023-01-12 DIAGNOSIS — J4540 Moderate persistent asthma, uncomplicated: Secondary | ICD-10-CM | POA: Diagnosis not present

## 2023-01-12 DIAGNOSIS — J3089 Other allergic rhinitis: Secondary | ICD-10-CM

## 2023-01-12 DIAGNOSIS — K219 Gastro-esophageal reflux disease without esophagitis: Secondary | ICD-10-CM

## 2023-01-12 MED ORDER — MONTELUKAST SODIUM 10 MG PO TABS: 10.0000 mg | ORAL_TABLET | Freq: Every day | ORAL | 0 refills | Status: AC

## 2023-01-12 MED ORDER — BREZTRI AEROSPHERE 160-9-4.8 MCG/ACT IN AERO
2.0000 | INHALATION_SPRAY | Freq: Two times a day (BID) | RESPIRATORY_TRACT | 3 refills | Status: DC
Start: 2023-01-12 — End: 2023-03-09

## 2023-01-12 MED ORDER — FLUTICASONE PROPIONATE 50 MCG/ACT NA SUSP: 2.0000 | Freq: Every day | NASAL | 1 refills | Status: DC

## 2023-01-12 NOTE — Patient Instructions (Signed)
Moderate Persistent  Asthma: not well  Controlled  - your lung testing today looked okay, but based on symptoms and albuterol use we need to start controller therapy   PLAN:  - Spacer sample and demonstration provided. - Controller Inhaler: Start Breztri   2 puffs twice a day; This Should Be Used Everyday - Rinse mouth out after use - Rescue Inhaler: Albuterol (Proair/Ventolin) 2 puffs . Use  every 4-6 hours as needed for chest tightness, wheezing, or coughing.  Can also use 15 minutes prior to exercise if you have symptoms with activity. - Asthma is not controlled if:  - Symptoms are occurring >2 times a week OR  - >2 times a month nighttime awakenings  - You are requiring systemic steroids (prednisone/steroid injections) more than once per year  - Your require hospitalization for your asthma.  - Please call the clinic to schedule a follow up if these symptoms arise   Allergic  Rhinitis   - Prevention:  - allergen avoidance when possible  - Symptom control: - Continue Nasal Steroid Spray: Best results if used daily. - Options include Flonase (fluticasone), Nasocort (triamcinolone), Nasonex (mometasome) 1- 2 sprays in each nostril daily.  - All can be purchased over-the-counter if not covered by insurance. - Start Singulair (Montelukast) 10mg  nightly.   - Discontinue if nightmares of behavior changes. - Start Antihistamine: daily or daily as needed.   -Options include Zyrtec (Cetirizine) 10mg , Claritin (Loratadine) 10mg , Allegra (Fexofenadine) 180mg , or Xyzal (Levocetirinze) 5mg  - Can be purchased over-the-counter if not covered by insurance.  Reflux:  - Continue omeprazole 40mg  daily  - Continue dietary and lifestyle modifications   Follow up: 4 weeks   Thank you so much for letting me partake in your care today.  Don't hesitate to reach out if you have any additional concerns!  Ferol Luz, MD  [facility] [place], [place]

## 2023-01-12 NOTE — Progress Notes (Signed)
Follow Up Note  RE: [name] MRN: [id number] DOB: 04-27-1946 Date of Office Visit: 01/12/2023  Referring provider: Jackie Plum, MD Primary care provider: Jackie Plum, MD  Chief Complaint: No chief complaint on file.  History of Present Illness: I had the pleasure of seeing [name] for a follow up visit at the [facility] on 01/12/2023. She is a 77 y.o. female, who is being followed for persistent asthma, GERD, allergic rhinitis . Her previous allergy office visit was on 11/09/20 with Dr. Lucie Leather. Today is a regular follow up visit.  History obtained from patient, chart review.  ASTHMA - Medical therapy: albuterol 2 puffs 2-3 times a day, Previously on Breztri and symbicort  - Symptoms: wheezing, coughing, dizziness  - Exacerbation history: 0 ABX for respiratory illness since last visit, 0 OCS, 0ED, 0 UC visits in the past year  - ACT: 15 /25 - Adverse effects of medication: denies  - Previous FEV1: 1.42 L, 95%   Allergic  Rhinitis: current therapy: flonase, nasal saline rinse ,  symptoms not improved symptoms include: nasal congestion, rhinorrhea, post nasal drainage, and itchy nose Previous allergy testing: yes History of reflux/heartburn: yes: on nexium 40mg  daily, mild increase in symptoms  Interested in Allergy Immunotherapy: no    Assessment and Plan: [name] is a 77 y.o. female with: Other allergic rhinitis  Not well controlled moderate persistent asthma - Plan: Spirometry with Graph  Gastroesophageal reflux disease without esophagitis   Plan: Patient Instructions  Moderate Persistent  Asthma: not well  Controlled  - your lung testing today looked okay, but based on symptoms and albuterol use we need to start controller therapy   PLAN:  - Spacer sample and demonstration provided. - Controller Inhaler: Start Breztri   2 puffs twice a day; This Should Be Used Everyday - Rinse mouth out after use - Rescue Inhaler:  Albuterol (Proair/Ventolin) 2 puffs . Use  every 4-6 hours as needed for chest tightness, wheezing, or coughing.  Can also use 15 minutes prior to exercise if you have symptoms with activity. - Asthma is not controlled if:  - Symptoms are occurring >2 times a week OR  - >2 times a month nighttime awakenings  - You are requiring systemic steroids (prednisone/steroid injections) more than once per year  - Your require hospitalization for your asthma.  - Please call the clinic to schedule a follow up if these symptoms arise   Allergic  Rhinitis   - Prevention:  - allergen avoidance when possible  - Symptom control: - Continue Nasal Steroid Spray: Best results if used daily. - Options include Flonase (fluticasone), Nasocort (triamcinolone), Nasonex (mometasome) 1- 2 sprays in each nostril daily.  - All can be purchased over-the-counter if not covered by insurance. - Start Singulair (Montelukast) 10mg  nightly.   - Discontinue if nightmares of behavior changes. - Start Antihistamine: daily or daily as needed.   -Options include Zyrtec (Cetirizine) 10mg , Claritin (Loratadine) 10mg , Allegra (Fexofenadine) 180mg , or Xyzal (Levocetirinze) 5mg  - Can be purchased over-the-counter if not covered by insurance.  Reflux:  - Continue omeprazole 40mg  daily  - Continue dietary and lifestyle modifications   Follow up: 4 weeks   Thank you so much for letting me partake in your care today.  Don't hesitate to reach out if you have any additional concerns!  Ferol Luz, MD  [facility] [place], [place]     Meds ordered this encounter  Medications   montelukast (SINGULAIR)  10 MG tablet    Sig: Take 1 tablet (10 mg total) by mouth at bedtime.    Dispense:  30 tablet    Refill:  0   fluticasone (FLONASE) 50 MCG/ACT nasal spray    Sig: Place 2 sprays into both nostrils daily.    Dispense:  48 g    Refill:  1   Budeson-Glycopyrrol-Formoterol (BREZTRI AEROSPHERE) 160-9-4.8 MCG/ACT  AERO    Sig: Inhale 2 puffs into the lungs in the morning and at bedtime.    Dispense:  10.7 g    Refill:  3    Lab Orders  No laboratory test(s) ordered today   Diagnostics: Spirometry:  Tracings reviewed. Her effort: Good reproducible efforts. FVC: 1.58L FEV1: 1.33L, 89% predicted FEV1/FVC ratio: 84% Interpretation: Spirometry consistent with normal pattern.  Please see scanned spirometry results for details.    Medication List:  Current Outpatient Medications  Medication Sig Dispense Refill   albuterol (VENTOLIN HFA) 108 (90 Base) MCG/ACT inhaler Inhale 2 puffs into the lungs every 6 (six) hours as needed for wheezing or shortness of breath. 8 g 0   amLODipine (NORVASC) 5 MG tablet Take 1 tablet (5 mg total) by mouth daily. 90 tablet 3   aspirin EC 81 MG tablet Take 1 tablet (81 mg total) by mouth daily. Swallow whole. 90 tablet 3   Budeson-Glycopyrrol-Formoterol (BREZTRI AEROSPHERE) 160-9-4.8 MCG/ACT AERO Inhale 2 puffs into the lungs in the morning and at bedtime. 10.7 g 3   buPROPion (WELLBUTRIN XL) 150 MG 24 hr tablet Take 150 mg by mouth every morning.     esomeprazole (NEXIUM) 40 MG capsule Take 1 capsule (40 mg total) by mouth daily at 12 noon. 90 capsule 3   gabapentin (NEURONTIN) 400 MG capsule Take 400 mg by mouth 3 (three) times daily.     lidocaine (LIDODERM) 5 % Place 1 patch onto the skin daily as needed. Apply patch to area most significant pain once per day.  Remove and discard patch within 12 hours of application. 15 patch 0   meclizine (ANTIVERT) 12.5 MG tablet Take 1 tablet (12.5 mg total) by mouth 3 (three) times daily as needed for dizziness. 30 tablet 0   metoprolol succinate (TOPROL-XL) 50 MG 24 hr tablet Take 1 tablet (50 mg total) by mouth daily. 90 tablet 3   nitroGLYCERIN (NITROSTAT) 0.3 MG SL tablet Place 0.3 mg under the tongue every 5 (five) minutes as needed for chest pain.     QUEtiapine (SEROQUEL) 25 MG tablet Take 25 mg by mouth at bedtime.      rosuvastatin (CRESTOR) 5 MG tablet Take 1 tablet (5 mg total) by mouth daily. 90 tablet 3   traMADol (ULTRAM) 50 MG tablet Take 1 tablet (50 mg total) by mouth every 8 (eight) hours as needed. 30 tablet 0   Vitamin D, Ergocalciferol, (DRISDOL) 1.25 MG (50000 UNIT) CAPS capsule Take 50,000 Units by mouth once a week.     fluticasone (FLONASE) 50 MCG/ACT nasal spray Place 2 sprays into both nostrils daily. 48 g 1   montelukast (SINGULAIR) 10 MG tablet Take 1 tablet (10 mg total) by mouth at bedtime. 30 tablet 0   No current facility-administered medications for this visit.   Allergies: No Known Allergies I reviewed her past medical history, social history, family history, and environmental history and no significant changes have been reported from her previous visit.  ROS: All others negative except as noted per HPI.   Objective: BP 136/84  Pulse 98   Temp (!) 96.8 F (36 C)   Resp 18   Ht 4\' 11"  (1.499 m)   Wt 176 lb 14.4 oz (80.2 kg)   SpO2 96%   BMI 35.73 kg/m  Body mass index is 35.73 kg/m. General Appearance:  Alert, cooperative, no distress, appears stated age  Head:  Normocephalic, without obvious abnormality, atraumatic  Eyes:  Conjunctiva clear, EOM's intact  Nose: Nares normal, normal mucosa, no visible anterior polyps, and septum midline  Throat: Lips, tongue normal; teeth and gums normal, + cobblestoning  Neck: Supple, symmetrical  Lungs:   clear to auscultation bilaterally, Respirations unlabored, no coughing  Heart:  regular rate and rhythm and no murmur, Appears well perfused  Extremities: No edema  Skin: Skin color, texture, turgor normal, no rashes or lesions on visualized portions of skin  Neurologic: No gross deficits   Previous notes and tests were reviewed. The plan was reviewed with the patient/family, and all questions/concerned were addressed.  It was my pleasure to see Cortnei today and participate in her care. Please feel free to contact me with any  questions or concerns.  Sincerely,  Ferol Luz, MD  Allergy & Immunology  [facility] Office: [phone]

## 2023-01-15 ENCOUNTER — Other Ambulatory Visit: Payer: Self-pay | Admitting: Obstetrics and Gynecology

## 2023-01-15 ENCOUNTER — Encounter: Payer: Self-pay | Admitting: Obstetrics and Gynecology

## 2023-01-15 DIAGNOSIS — N950 Postmenopausal bleeding: Secondary | ICD-10-CM

## 2023-01-24 ENCOUNTER — Ambulatory Visit
Admission: RE | Admit: 2023-01-24 | Discharge: 2023-01-24 | Disposition: A | Discharge status: Home / Self Care | Payer: 59 | Source: Ambulatory Visit | Attending: Obstetrics and Gynecology | Admitting: Obstetrics and Gynecology

## 2023-01-24 ENCOUNTER — Other Ambulatory Visit: Payer: Self-pay | Admitting: Obstetrics and Gynecology

## 2023-01-24 DIAGNOSIS — N950 Postmenopausal bleeding: Secondary | ICD-10-CM

## 2023-02-23 ENCOUNTER — Ambulatory Visit: Payer: 59 | Admitting: Internal Medicine

## 2023-03-09 ENCOUNTER — Other Ambulatory Visit: Payer: Self-pay

## 2023-03-09 ENCOUNTER — Encounter: Payer: Self-pay | Admitting: Internal Medicine

## 2023-03-09 ENCOUNTER — Ambulatory Visit (INDEPENDENT_AMBULATORY_CARE_PROVIDER_SITE_OTHER): Payer: 59 | Admitting: Internal Medicine

## 2023-03-09 VITALS — BP 130/62 | HR 83 | Temp 97.7°F | Ht 59.0 in | Wt 182.7 lb

## 2023-03-09 DIAGNOSIS — J4540 Moderate persistent asthma, uncomplicated: Secondary | ICD-10-CM | POA: Diagnosis not present

## 2023-03-09 DIAGNOSIS — K219 Gastro-esophageal reflux disease without esophagitis: Secondary | ICD-10-CM | POA: Diagnosis not present

## 2023-03-09 DIAGNOSIS — J3089 Other allergic rhinitis: Secondary | ICD-10-CM | POA: Diagnosis not present

## 2023-03-09 MED ORDER — BREZTRI AEROSPHERE 160-9-4.8 MCG/ACT IN AERO: 2.0000 | INHALATION_SPRAY | Freq: Two times a day (BID) | RESPIRATORY_TRACT | 3 refills | Status: AC

## 2023-03-09 MED ORDER — ALBUTEROL SULFATE HFA 108 (90 BASE) MCG/ACT IN AERS: 2.0000 | INHALATION_SPRAY | Freq: Four times a day (QID) | RESPIRATORY_TRACT | 0 refills | Status: AC | PRN

## 2023-03-09 MED ORDER — PREDNISONE 10 MG PO TABS: ORAL_TABLET | ORAL | 0 refills | Status: DC

## 2023-03-09 NOTE — Progress Notes (Signed)
Follow Up Note  RE: [name] MRN: [id number] DOB: July 11, 1945 Date of Office Visit: 03/09/2023  Referring provider: Jackie Plum, MD Primary care provider: Jackie Plum, MD  Chief Complaint: Asthma, Wheezing, Breathing Problem, and Cough (C/o chest congestion )  History of Present Illness: I had the pleasure of seeing [name] for a follow up visit at the [facility] on 03/09/2023. She is a 77 y.o. female, who is being followed for persistent asthma, GERD, allergic rhinitis . Her previous allergy office visit was on 01/12/23 with Dr. Marlynn Perking. Crane is a regular follow up visit.  History obtained from patient, chart review.  The patient, with a known history of asthma, reports some improvement in symptoms after a recent medication change to [place]. However, she continues to experience coughing, wheezing, and congestion during the day. The patient confirms adherence to the [facility] regimen, but notes the need for additional relief during the day, occasionally resorting to overuse of her rescue inhaler, albuterol.  The patient also describes a sensation of congestion rising from the esophagus to the throat, which she believes may be contributing to her breathing difficulties. She is currently on Nexium 40mg  for this issue. The patient confirms some discomfort on taking deep breaths, but denies significant pain. Despite the use of her rescue inhaler twice in the morning, she continues to experience coughing and wheezing.  She has not tried biologics or had biologic labs. . She expresses a willingness to consider this treatment option again, pending lab results.    Assessment and Plan: [name] is a 77 y.o. female with: Not well controlled moderate persistent asthma - Plan: CBC With Diff/Platelet, Allergens w/Total IgE Area 2  Other allergic rhinitis  Gastroesophageal reflux disease without esophagitis   Plan: Patient Instructions  Moderate  Persistent  Asthma:  improved but not at goal  Prednisone 10mg  : Take 2 tablets twice a day for 3 more days, Then take 2 tablets once a day for 1 day., then take 1 tablet once a day for 1 day.   Will get biologic labs   PLAN:  - Spacer sample and demonstration provided. - Controller Inhaler: Continue Breztri   2 puffs twice a day; This Should Be Used Everyday - Rinse mouth out after use - Rescue Inhaler: Albuterol (Proair/Ventolin) 2 puffs . Use  every 4-6 hours as needed for chest tightness, wheezing, or coughing.  Can also use 15 minutes prior to exercise if you have symptoms with activity. - Asthma is not controlled if:  - Symptoms are occurring >2 times a week OR  - >2 times a month nighttime awakenings  - You are requiring systemic steroids (prednisone/steroid injections) more than once per year  - Your require hospitalization for your asthma.  - Please call the clinic to schedule a follow up if these symptoms arise   Allergic  Rhinitis   - Prevention:  - allergen avoidance when possible  - Symptom control: - Continue Nasal Steroid Spray: Best results if used daily. - Options include Flonase (fluticasone), Nasocort (triamcinolone), Nasonex (mometasome) 1- 2 sprays in each nostril daily.  - All can be purchased over-the-counter if not covered by insurance. - Continue Singulair (Montelukast) 10mg  nightly.   - Discontinue if nightmares of behavior changes. - Continue Antihistamine: daily or daily as needed.   -Options include Zyrtec (Cetirizine) 10mg , Claritin (Loratadine) 10mg , Allegra (Fexofenadine) 180mg , or Xyzal (Levocetirinze) 5mg  - Can be purchased over-the-counter if not covered by insurance.  Reflux:  -  Continue omeprazole 40mg  daily  - Continue dietary and lifestyle modifications   Follow up:we will call you with lab results and options for biologics (special asthma meds)  Follow up: in clinic in 3 months   Thank you so much for letting me partake in your care  Crane.  Don't hesitate to reach out if you have any additional concerns!  Ferol Luz, MD  [facility] [place] [place]    Meds ordered this encounter  Medications   albuterol (VENTOLIN HFA) 108 (90 Base) MCG/ACT inhaler    Sig: Inhale 2 puffs into the lungs every 6 (six) hours as needed for wheezing or shortness of breath.    Dispense:  8.5 g    Refill:  0   Budeson-Glycopyrrol-Formoterol (BREZTRI AEROSPHERE) 160-9-4.8 MCG/ACT AERO    Sig: Inhale 2 puffs into the lungs in the morning and at bedtime.    Dispense:  10.7 g    Refill:  3   predniSONE (DELTASONE) 10 MG tablet    Sig: Prednisone 10mg  : Take 2 tablets twice a day for 3 more days, Then take 2 tablets once a day for 1 day., then take 1 tablet once a day for 1 day.    Dispense:  15 tablet    Refill:  0    Lab Orders         CBC With Diff/Platelet         Allergens w/Total IgE Area 2     Diagnostics: Spirometry:  attempted but patient could not complete to due coughing.     Medication List:  Current Outpatient Medications  Medication Sig Dispense Refill   amLODipine (NORVASC) 5 MG tablet Take 1 tablet (5 mg total) by mouth daily. 90 tablet 3   aspirin EC 81 MG tablet Take 1 tablet (81 mg total) by mouth daily. Swallow whole. 90 tablet 3   buPROPion (WELLBUTRIN XL) 150 MG 24 hr tablet Take 150 mg by mouth every morning.     esomeprazole (NEXIUM) 40 MG capsule Take 1 capsule (40 mg total) by mouth daily at 12 noon. 90 capsule 3   fluticasone (FLONASE) 50 MCG/ACT nasal spray Place 2 sprays into both nostrils daily. 48 g 1   gabapentin (NEURONTIN) 400 MG capsule Take 400 mg by mouth 3 (three) times daily.     lidocaine (LIDODERM) 5 % Place 1 patch onto the skin daily as needed. Apply patch to area most significant pain once per day.  Remove and discard patch within 12 hours of application. 15 patch 0   metoprolol succinate (TOPROL-XL) 50 MG 24 hr tablet Take 1 tablet (50 mg total) by mouth daily. 90  tablet 3   montelukast (SINGULAIR) 10 MG tablet Take 1 tablet (10 mg total) by mouth at bedtime. 30 tablet 0   nitroGLYCERIN (NITROSTAT) 0.3 MG SL tablet Place 0.3 mg under the tongue every 5 (five) minutes as needed for chest pain.     predniSONE (DELTASONE) 10 MG tablet Prednisone 10mg  : Take 2 tablets twice a day for 3 more days, Then take 2 tablets once a day for 1 day., then take 1 tablet once a day for 1 day. 15 tablet 0   rosuvastatin (CRESTOR) 5 MG tablet Take 1 tablet (5 mg total) by mouth daily. 90 tablet 3   traMADol (ULTRAM) 50 MG tablet Take 1 tablet (50 mg total) by mouth every 8 (eight) hours as needed. 30 tablet 0   Vitamin D, Ergocalciferol, (DRISDOL) 1.25 MG (  50000 UNIT) CAPS capsule Take 50,000 Units by mouth once a week.     albuterol (VENTOLIN HFA) 108 (90 Base) MCG/ACT inhaler Inhale 2 puffs into the lungs every 6 (six) hours as needed for wheezing or shortness of breath. 8.5 g 0   Budeson-Glycopyrrol-Formoterol (BREZTRI AEROSPHERE) 160-9-4.8 MCG/ACT AERO Inhale 2 puffs into the lungs in the morning and at bedtime. 10.7 g 3   meclizine (ANTIVERT) 12.5 MG tablet Take 1 tablet (12.5 mg total) by mouth 3 (three) times daily as needed for dizziness. (Patient not taking: Reported on 03/09/2023) 30 tablet 0   QUEtiapine (SEROQUEL) 25 MG tablet Take 25 mg by mouth at bedtime. (Patient not taking: Reported on 03/09/2023)     No current facility-administered medications for this visit.   Allergies: No Known Allergies I reviewed her past medical history, social history, family history, and environmental history and no significant changes have been reported from her previous visit.  ROS: All others negative except as noted per HPI.   Objective: BP 130/62   Pulse 83   Temp 97.7 F (36.5 C)   Ht 4\' 11"  (1.499 m)   Wt 182 lb 11.2 oz (82.9 kg)   SpO2 98%   BMI 36.90 kg/m  Body mass index is 36.9 kg/m. General Appearance:  Alert, cooperative, no distress, appears stated age   Head:  Normocephalic, without obvious abnormality, atraumatic  Eyes:  Conjunctiva clear, EOM's intact  Nose: Nares normal, normal mucosa, no visible anterior polyps, and septum midline  Throat: Lips, tongue normal; teeth and gums normal, + cobblestoning  Neck: Supple, symmetrical  Lungs:   clear to auscultation bilaterally, Respirations unlabored, intermittent dry coughing  Heart:  regular rate and rhythm and no murmur, Appears well perfused  Extremities: No edema  Skin: Skin color, texture, turgor normal, no rashes or lesions on visualized portions of skin  Neurologic: No gross deficits   Previous notes and tests were reviewed. The plan was reviewed with the patient/family, and all questions/concerned were addressed.  It was my pleasure to see [name] Crane and participate in her care. Please feel free to contact me with any questions or concerns.  Sincerely,  Ferol Luz, MD  Allergy & Immunology  [facility] Office: [phone]

## 2023-03-09 NOTE — Patient Instructions (Signed)
Moderate Persistent  Asthma:  improved but not at goal  Prednisone 10mg  : Take 2 tablets twice a day for 3 more days, Then take 2 tablets once a day for 1 day., then take 1 tablet once a day for 1 day.   Will get biologic labs   PLAN:  - Spacer sample and demonstration provided. - Controller Inhaler: Continue Breztri   2 puffs twice a day; This Should Be Used Everyday - Rinse mouth out after use - Rescue Inhaler: Albuterol (Proair/Ventolin) 2 puffs . Use  every 4-6 hours as needed for chest tightness, wheezing, or coughing.  Can also use 15 minutes prior to exercise if you have symptoms with activity. - Asthma is not controlled if:  - Symptoms are occurring >2 times a week OR  - >2 times a month nighttime awakenings  - You are requiring systemic steroids (prednisone/steroid injections) more than once per year  - Your require hospitalization for your asthma.  - Please call the clinic to schedule a follow up if these symptoms arise   Allergic  Rhinitis   - Prevention:  - allergen avoidance when possible  - Symptom control: - Continue Nasal Steroid Spray: Best results if used daily. - Options include Flonase (fluticasone), Nasocort (triamcinolone), Nasonex (mometasome) 1- 2 sprays in each nostril daily.  - All can be purchased over-the-counter if not covered by insurance. - Continue Singulair (Montelukast) 10mg  nightly.   - Discontinue if nightmares of behavior changes. - Continue Antihistamine: daily or daily as needed.   -Options include Zyrtec (Cetirizine) 10mg , Claritin (Loratadine) 10mg , Allegra (Fexofenadine) 180mg , or Xyzal (Levocetirinze) 5mg  - Can be purchased over-the-counter if not covered by insurance.  Reflux:  - Continue omeprazole 40mg  daily  - Continue dietary and lifestyle modifications   Follow up:we will call you with lab results and options for biologics (special asthma meds)  Follow up: in clinic in 3 months   Thank you so much for letting me partake in your  care today.  Don't hesitate to reach out if you have any additional concerns!  Ferol Luz, MD  [facility] [place] [place]

## 2023-03-14 LAB — ALLERGENS W/TOTAL IGE AREA 2

## 2023-03-14 LAB — CBC WITH DIFF/PLATELET
Basophils Absolute: 0 10*3/uL (ref 0.0–0.2)
Basos: 0 %
EOS (ABSOLUTE): 0.1 10*3/uL (ref 0.0–0.4)
Eos: 1 %
Hematocrit: 42.2 % (ref 34.0–46.6)
Hemoglobin: 14.1 g/dL (ref 11.1–15.9)
Immature Grans (Abs): 0 10*3/uL (ref 0.0–0.1)
Immature Granulocytes: 0 %
Lymphocytes Absolute: 2.4 10*3/uL (ref 0.7–3.1)
Lymphs: 45 %
MCH: 32.3 pg (ref 26.6–33.0)
MCHC: 33.4 g/dL (ref 31.5–35.7)
MCV: 97 fL (ref 79–97)
Monocytes Absolute: 0.3 10*3/uL (ref 0.1–0.9)
Monocytes: 6 %
Neutrophils Absolute: 2.6 10*3/uL (ref 1.4–7.0)
Neutrophils: 48 %
Platelets: 307 10*3/uL (ref 150–450)
RBC: 4.37 x10E6/uL (ref 3.77–5.28)
RDW: 13.7 % (ref 11.7–15.4)
WBC: 5.4 10*3/uL (ref 3.4–10.8)

## 2023-03-16 NOTE — Progress Notes (Signed)
Patient would be a candidate for [facility] for biologics.  Can someone contact patient and gauge interest?  Thanks!

## 2023-04-21 NOTE — Progress Notes (Deleted)
Cardiology Office Note:    Date:  04/21/2023   ID:  [name], DOB 10-18-1945, MRN [id number]  PCP:  Jackie Plum, MD   [facility] Providers Cardiologist:  Meriam Sprague, MD (Inactive)   Referring MD: Jackie Plum, MD    History of Present Illness:    [name] is a 77 y.o. female with a hx of OSA, asthma, HTN and depression who presents to clinic for follow-up.   Initially seen in 02/2022 due to atypical chest pain and lower extremity edema.  Prior LHC in 2014 with no coronary atherosclerosis.  Echocardiogram 02/22/2022 normal LVEF, normal diastolic parameters, RV normal, trivial MR.   Cardiac CTA 07/11/2022 with aortic atherosclerosis, coronary calcium score of 65 placing her in the 64th percentile.  Overall minimal nonobstructive CAD (ostial LAD and circumflex 0 to 24% stenosis) with finding of small PFO.  She was recommended to start aspirin 81 mg daily rosuvastatin 5 mg daily.  Was last seen in clinic by Gillian Shields on 07/31/22 where she was stable from a CV standpoint. BP elevated to 140-150s. Metoprolol was increased to 50mg  daily.  Today, the patient overall feels well. Continues to have small amount of chest pain after she eats lunch. No exertional chest pain. She admits she is not as active as she used to be and has joint pain with exertion. Continues to have dyspnea on exertion which she attributes to her asthma. No orthopnea or PND. Has occasional LE edema that has since improved. Blood pressure has been 130-150s/80s today.   THe pt was previously followed by Jon Billings, last seen in July 2024  Past Medical History:  Diagnosis Date   Allergy    Arthritis    Asthma    CAD (coronary artery disease)    Chronic headaches    Depression    Diabetes mellitus, type II (HCC)    Diverticulosis    Endometriosis    GERD (gastroesophageal reflux disease)    Heart attack (HCC)    High cholesterol    Hypertension    Mallory-Weiss  tear 2003   Mood swings    Rheumatoid arthritis (HCC)    Vitamin D deficiency     Past Surgical History:  Procedure Laterality Date   ABDOMINAL HYSTERECTOMY     EXPLORATORY LAPAROTOMY     I & D EXTREMITY Left 08/04/2017   Procedure: IRRIGATION AND DEBRIDEMENT HAND;  Surgeon: Betha Loa, MD;  Location: [facility] OR;  Service: Orthopedics;  Laterality: Left;   LEFT HEART CATHETERIZATION WITH CORONARY ANGIOGRAM N/A 04/10/2013   Procedure: LEFT HEART CATHETERIZATION WITH CORONARY ANGIOGRAM;  Surgeon: Ricki Rodriguez, MD;  Location: [facility] CATH LAB;  Service: Cardiovascular;  Laterality: N/A;    Current Medications: No outpatient medications have been marked as taking for the 04/30/23 encounter (Appointment) with Pricilla Riffle, MD.     Allergies:   Patient has no known allergies.   Social History   Socioeconomic History   Marital status: Divorced    Spouse name: Not on file   Number of children: Not on file   Years of education: Not on file   Highest education level: Not on file  Occupational History   Not on file  Tobacco Use   Smoking status: Former    Types: Cigarettes   Smokeless tobacco: Never  Vaping Use   Vaping status: Never Used  Substance and Sexual Activity   Alcohol use: Yes    Comment: occasional   Drug use: No  Sexual activity: Never  Other Topics Concern   Not on file  Social History Narrative   Not on file   Social Drivers of Health   Financial Resource Strain: Not on file  Food Insecurity: Not on file  Transportation Needs: Not on file  Physical Activity: Not on file  Stress: Not on file  Social Connections: Not on file     Family History: The patient's family history includes Cancer in her father and mother; Heart disease in her father and mother.  ROS:   As per HPI   EKGs/Labs/Other Studies Reviewed:    The following studies were reviewed today: Cardiac Studies & Procedures      ECHOCARDIOGRAM  ECHOCARDIOGRAM COMPLETE  02/21/2022  Narrative ECHOCARDIOGRAM REPORT    Patient Name:   [name] Date of Exam: 02/21/2022 Medical Rec #:  [id number]        Height:       62.0 in Accession #:    [id number]       Weight:       168.6 lb Date of Birth:  18-Jan-1946       BSA:          1.778 m Patient Age:    75 years         BP:           132/80 mmHg Patient Gender: F                HR:           79 bpm. Exam Location:  [address]  Procedure: 2D Echo, Cardiac Doppler and Color Doppler  Indications:    R60.0 Edema  History:        Patient has no prior history of Echocardiogram examinations. Risk Factors:Hypertension.  Sonographer:    Daphine Deutscher RDCS Referring Phys: [id number] [facility] E [place]  IMPRESSIONS   1. Left ventricular ejection fraction, by estimation, is 55 to 60%. The left ventricle has normal function. The left ventricle has no regional wall motion abnormalities. Left ventricular diastolic parameters were normal. 2. Right ventricular systolic function is normal. The right ventricular size is normal. There is normal pulmonary artery systolic pressure. 3. The mitral valve is normal in structure. Trivial mitral valve regurgitation. No evidence of mitral stenosis. 4. The aortic valve is tricuspid. Aortic valve regurgitation is not visualized. No aortic stenosis is present. 5. The inferior vena cava is normal in size with greater than 50% respiratory variability, suggesting right atrial pressure of 3 mmHg.  Comparison(s): No prior Echocardiogram.  Conclusion(s)/Recommendation(s): Normal biventricular function without evidence of hemodynamically significant valvular heart disease.  FINDINGS Left Ventricle: Left ventricular ejection fraction, by estimation, is 55 to 60%. The left ventricle has normal function. The left ventricle has no regional wall motion abnormalities. The left ventricular internal cavity size was normal in size. There is no left ventricular hypertrophy. Left  ventricular diastolic parameters were normal.  Right Ventricle: The right ventricular size is normal. No increase in right ventricular wall thickness. Right ventricular systolic function is normal. There is normal pulmonary artery systolic pressure. The tricuspid regurgitant velocity is 2.47 m/s, and with an assumed right atrial pressure of 3 mmHg, the estimated right ventricular systolic pressure is 27.4 mmHg.  Left Atrium: Left atrial size was normal in size.  Right Atrium: Right atrial size was normal in size.  Pericardium: There is no evidence of pericardial effusion.  Mitral Valve: The mitral valve is normal in structure. Trivial mitral valve regurgitation.  No evidence of mitral valve stenosis.  Tricuspid Valve: The tricuspid valve is normal in structure. Tricuspid valve regurgitation is trivial. No evidence of tricuspid stenosis.  Aortic Valve: The aortic valve is tricuspid. Aortic valve regurgitation is not visualized. No aortic stenosis is present.  Pulmonic Valve: The pulmonic valve was not well visualized. Pulmonic valve regurgitation is not visualized. No evidence of pulmonic stenosis.  Aorta: The aortic root, ascending aorta, aortic arch and descending aorta are all structurally normal, with no evidence of dilitation or obstruction.  Venous: The inferior vena cava is normal in size with greater than 50% respiratory variability, suggesting right atrial pressure of 3 mmHg.  IAS/Shunts: No atrial level shunt detected by color flow Doppler.   LEFT VENTRICLE PLAX 2D LVIDd:         4.00 cm   Diastology LVIDs:         2.80 cm   LV e' medial:    8.09 cm/s LV PW:         0.90 cm   LV E/e' medial:  8.9 LV IVS:        0.90 cm   LV e' lateral:   8.52 cm/s LVOT diam:     2.00 cm   LV E/e' lateral: 8.5 LV SV:         85 LV SV Index:   48 LVOT Area:     3.14 cm   RIGHT VENTRICLE             IVC RV Basal diam:  3.50 cm     IVC diam: 1.60 cm RV S prime:     17.15 cm/s TAPSE  (M-mode): 2.4 cm  LEFT ATRIUM             Index        RIGHT ATRIUM           Index LA diam:        3.70 cm 2.08 cm/m   RA Area:     11.10 cm LA Vol (A2C):   40.9 ml 23.01 ml/m  RA Volume:   27.00 ml  15.19 ml/m LA Vol (A4C):   33.8 ml 19.01 ml/m LA Biplane Vol: 38.4 ml 21.60 ml/m AORTIC VALVE LVOT Vmax:   124.50 cm/s LVOT Vmean:  87.050 cm/s LVOT VTI:    0.271 m  AORTA Ao Root diam: 3.50 cm Ao Asc diam:  3.20 cm  MITRAL VALVE                TRICUSPID VALVE MV Area (PHT): 4.10 cm     TR Peak grad:   24.4 mmHg MV Decel Time: 185 msec     TR Vmax:        247.00 cm/s MV E velocity: 72.30 cm/s MV A velocity: 101.90 cm/s  SHUNTS MV E/A ratio:  0.71         Systemic VTI:  0.27 m Systemic Diam: 2.00 cm  Jodelle Red MD Electronically signed by Jodelle Red MD Signature Date/Time: 02/22/2022/8:22:30 AM    Final    CT SCANS  CT CORONARY MORPH W/CTA COR W/SCORE 07/11/2022  Addendum 07/12/2022  7:41 PM ADDENDUM REPORT: 07/12/2022 19:38  EXAM: OVER-READ INTERPRETATION  CT CHEST  The following report is an over-read performed by radiologist Dr. Alcide Clever of [facility] on 07/12/2022. This over-read does not include interpretation of cardiac or coronary anatomy or pathology. The coronary calcium score/coronary CTA interpretation by the cardiologist is attached.  COMPARISON:  None.  FINDINGS: Cardiovascular: Atherosclerotic calcifications of the thoracic aorta are noted without aneurysmal dilatation. Pulmonary artery shows no evidence of pulmonary embolism.  Mediastinum/Nodes: There are no enlarged lymph nodes within the visualized mediastinum.Esophagus as visualized is within normal limits.  Lungs/Pleura: There is no pleural effusion. The visualized lungs appear clear.  Upper abdomen: Visualized upper abdomen is within normal limits.  Musculoskeletal/Chest wall: No chest wall mass or suspicious osseous findings within the visualized  chest.  IMPRESSION: Aortic Atherosclerosis (ICD10-I70.0).  No other significant extracardiac findings are noted.   Electronically Signed By: Alcide Clever M.D. On: 07/12/2022 19:38  Narrative CLINICAL DATA:  77 year old with chest pain  EXAM: Cardiac/Coronary  CTA  TECHNIQUE: The patient was scanned on a [employer].  FINDINGS: A 120 kV prospective scan was triggered in the descending thoracic aorta at 111 HU's. Axial non-contrast 3 mm slices were carried out through the heart. The data set was analyzed on a dedicated work station and scored using the Agatson method. Gantry rotation speed was 250 msecs and collimation was .6 mm. 0.8 mg of sl NTG was given. The 3D data set was reconstructed in 5% intervals of the 67-82 % of the R-R cycle. Diastolic phases were analyzed on a dedicated work station using MPR, MIP and VRT modes. The patient received 80 cc of contrast.  Image quality: Fair, misregistration artifact, however interpretable.  Aorta: Normal size (34 mm ascending). No calcifications. No dissection.  Aortic Valve:  Trileaflet.  No calcifications.  Coronary Arteries:  Normal coronary origin.  Right dominance.  RCA is a large dominant artery that gives rise to PDA and PLA. There is no plaque. PDA is small caliber and underfilled.  Left main is a large artery that gives rise to LAD and LCX arteries.  LAD is a large vessel that has ostial calcified plaque, 0-24% stenosis.  D1: Small under filled.  LCX is a non-dominant artery. There is ostial calcified plaque, 0-24% stenosis.  OM1, 2, 3 are small in caliber, no plaque  Other findings:  Normal pulmonary vein drainage into the left atrium.  Normal left atrial appendage without a thrombus.  Normal size of the pulmonary artery.  Please see radiology report for non cardiac findings.  IMPRESSION: 1. Coronary calcium score of 65. This was 74 percentile for age and sex matched control.  2.  Normal coronary origin with right dominance.  3. Minimal non obstructive CAD (Ostial LAD and Circumflex calcified plaque, 0-24% stenosis).  4. Small PFO  CAD-RADS 1. Minimal non-obstructive CAD (0-24%). Consider non-atherosclerotic causes of chest pain. Consider preventive therapy and risk factor modification.  Electronically Signed: By: Donato Schultz M.D. On: 07/11/2022 11:32           EKG:  EKG is personally reviewed. NSR, nonspecific T wave changes, HR 90bpm   Recent Labs: 05/31/2022: ALT 11 07/03/2022: BNP 48.0; BUN 16; Creatinine, Ser 0.93; Potassium 4.4; Sodium 141 03/09/2023: Hemoglobin 14.1; Platelets 307   Recent Lipid Panel    Component Value Date/Time   CHOL 157 02/08/2022 1021   TRIG 105 02/08/2022 1021   HDL 77 02/08/2022 1021   CHOLHDL 2.0 02/08/2022 1021   LDLCALC 61 02/08/2022 1021     Risk Assessment/Calculations:      No BP recorded.  {Refresh Note OR Click here to enter BP  :1}***         Physical Exam:    VS:  There were no vitals taken for this visit.    Wt Readings  from Last 3 Encounters:  03/09/23 182 lb 11.2 oz (82.9 kg)  01/12/23 176 lb 14.4 oz (80.2 kg)  11/23/22 175 lb (79.4 kg)     GEN: Comfortable HEENT: Normal NECK: No JVD; No carotid bruits CARDIAC: RRR, 2/6 systolic murmur. No rubs or gallops RESPIRATORY:  Clear to auscultation without rales, wheezing or rhonchi  ABDOMEN: Soft, non-tender, non-distended MUSCULOSKELETAL:  Warm, trace edema. SKIN: Warm and dry NEUROLOGIC:  Alert and oriented x 3 PSYCHIATRIC:  Normal affect   ASSESSMENT:    No diagnosis found.  PLAN:    In order of problems listed above:  #Coronary Artery Disease: -Coronary CTA with minimal nonobstructive CAD -Continue ASA 81mg  daily -Continue crestor 5mg  daily  #HTN: Running mainly high 130-140s at home -Increae amlodipine to 5mg  daily -Continue metop 50mg  XL daily  #LE Edema: Chronic and stable. TTE 02/2022 with EF 55-60%, normal diastology,  normal RV, trivial MR -Continue lasix 20mg  daily       Follow-up:  6 months.  Medication Adjustments/Labs and Tests Ordered: Current medicines are reviewed at length with the patient today.  Concerns regarding medicines are outlined above.   No orders of the defined types were placed in this encounter.  No orders of the defined types were placed in this encounter.  There are no Patient Instructions on file for this visit.    Signed, Dietrich Pates, MD  04/21/2023 10:26 PM    [facility]

## 2023-04-30 ENCOUNTER — Ambulatory Visit: Payer: 59 | Admitting: Internal Medicine

## 2023-06-22 ENCOUNTER — Ambulatory Visit: Payer: 59 | Admitting: Orthopedic Surgery

## 2023-06-24 NOTE — Progress Notes (Deleted)
 Cardiology Office Note:    Date:  06/24/2023   ID:  [name], DOB 04/10/1946, MRN [id number]  PCP:  Jackie Plum, MD   [facility] Providers Cardiologist:  Meriam Sprague, MD (Inactive)   Referring MD: Jackie Plum, MD    History of Present Illness:    [name] is a 78 y.o. female with a hx of OSA, asthma, HTN and depression who presents to clinic for follow-up.   Initially seen in 02/2022 due to atypical chest pain and lower extremity edema.  Prior LHC in 2014 with no coronary atherosclerosis.  Echocardiogram 02/22/2022 normal LVEF, normal diastolic parameters, RV normal, trivial MR.   Cardiac CTA 07/11/2022 with aortic atherosclerosis, coronary calcium score of 65 placing her in the 64th percentile.  Overall minimal nonobstructive CAD (ostial LAD and circumflex 0 to 24% stenosis) with finding of small PFO.  She was recommended to start aspirin 81 mg daily rosuvastatin 5 mg daily.  Was last seen in clinic by Gillian Shields on 07/31/22 where she was stable from a CV standpoint. BP elevated to 140-150s. Metoprolol was increased to 50mg  daily.  Today, the patient overall feels well. Continues to have small amount of chest pain after she eats lunch. No exertional chest pain. She admits she is not as active as she used to be and has joint pain with exertion. Continues to have dyspnea on exertion which she attributes to her asthma. No orthopnea or PND. Has occasional LE edema that has since improved. Blood pressure has been 130-150s/80s today.   THe pt was previously followed by Jon Billings   Last seen in July 2024  Past Medical History:  Diagnosis Date   Allergy    Arthritis    Asthma    CAD (coronary artery disease)    Chronic headaches    Depression    Diabetes mellitus, type II (HCC)    Diverticulosis    Endometriosis    GERD (gastroesophageal reflux disease)    Heart attack (HCC)    High cholesterol    Hypertension    Mallory-Weiss  tear 2003   Mood swings    Rheumatoid arthritis (HCC)    Vitamin D deficiency     Past Surgical History:  Procedure Laterality Date   ABDOMINAL HYSTERECTOMY     EXPLORATORY LAPAROTOMY     I & D EXTREMITY Left 08/04/2017   Procedure: IRRIGATION AND DEBRIDEMENT HAND;  Surgeon: Betha Loa, MD;  Location: [facility] OR;  Service: Orthopedics;  Laterality: Left;   LEFT HEART CATHETERIZATION WITH CORONARY ANGIOGRAM N/A 04/10/2013   Procedure: LEFT HEART CATHETERIZATION WITH CORONARY ANGIOGRAM;  Surgeon: Ricki Rodriguez, MD;  Location: [facility] CATH LAB;  Service: Cardiovascular;  Laterality: N/A;    Current Medications: No outpatient medications have been marked as taking for the 06/25/23 encounter (Appointment) with Pricilla Riffle, MD.     Allergies:   Patient has no known allergies.   Social History   Socioeconomic History   Marital status: Divorced    Spouse name: Not on file   Number of children: Not on file   Years of education: Not on file   Highest education level: Not on file  Occupational History   Not on file  Tobacco Use   Smoking status: Former    Types: Cigarettes   Smokeless tobacco: Never  Vaping Use   Vaping status: Never Used  Substance and Sexual Activity   Alcohol use: Yes    Comment: occasional   Drug  use: No   Sexual activity: Never  Other Topics Concern   Not on file  Social History Narrative   Not on file   Social Drivers of Health   Financial Resource Strain: Not on file  Food Insecurity: Not on file  Transportation Needs: Not on file  Physical Activity: Not on file  Stress: Not on file  Social Connections: Not on file     Family History: The patient's family history includes Cancer in her father and mother; Heart disease in her father and mother.  ROS:   As per HPI   EKGs/Labs/Other Studies Reviewed:    The following studies were reviewed today: Cardiac Studies & Procedures    ______________________________________________________________________________________________     ECHOCARDIOGRAM  ECHOCARDIOGRAM COMPLETE 02/21/2022  Narrative ECHOCARDIOGRAM REPORT    Patient Name:   [name] Date of Exam: 02/21/2022 Medical Rec #:  [id number]        Height:       62.0 in Accession #:    [id number]       Weight:       168.6 lb Date of Birth:  May 03, 1946       BSA:          1.778 m Patient Age:    75 years         BP:           132/80 mmHg Patient Gender: F                HR:           79 bpm. Exam Location:  [address]  Procedure: 2D Echo, Cardiac Doppler and Color Doppler  Indications:    R60.0 Edema  History:        Patient has no prior history of Echocardiogram examinations. Risk Factors:Hypertension.  Sonographer:    Daphine Deutscher RDCS Referring Phys: [id number] [facility] [place]  IMPRESSIONS   1. Left ventricular ejection fraction, by estimation, is 55 to 60%. The left ventricle has normal function. The left ventricle has no regional wall motion abnormalities. Left ventricular diastolic parameters were normal. 2. Right ventricular systolic function is normal. The right ventricular size is normal. There is normal pulmonary artery systolic pressure. 3. The mitral valve is normal in structure. Trivial mitral valve regurgitation. No evidence of mitral stenosis. 4. The aortic valve is tricuspid. Aortic valve regurgitation is not visualized. No aortic stenosis is present. 5. The inferior vena cava is normal in size with greater than 50% respiratory variability, suggesting right atrial pressure of 3 mmHg.  Comparison(s): No prior Echocardiogram.  Conclusion(s)/Recommendation(s): Normal biventricular function without evidence of hemodynamically significant valvular heart disease.  FINDINGS Left Ventricle: Left ventricular ejection fraction, by estimation, is 55 to 60%. The left ventricle has normal function. The left ventricle has no  regional wall motion abnormalities. The left ventricular internal cavity size was normal in size. There is no left ventricular hypertrophy. Left ventricular diastolic parameters were normal.  Right Ventricle: The right ventricular size is normal. No increase in right ventricular wall thickness. Right ventricular systolic function is normal. There is normal pulmonary artery systolic pressure. The tricuspid regurgitant velocity is 2.47 m/s, and with an assumed right atrial pressure of 3 mmHg, the estimated right ventricular systolic pressure is 27.4 mmHg.  Left Atrium: Left atrial size was normal in size.  Right Atrium: Right atrial size was normal in size.  Pericardium: There is no evidence of pericardial effusion.  Mitral Valve: The mitral valve is normal  in structure. Trivial mitral valve regurgitation. No evidence of mitral valve stenosis.  Tricuspid Valve: The tricuspid valve is normal in structure. Tricuspid valve regurgitation is trivial. No evidence of tricuspid stenosis.  Aortic Valve: The aortic valve is tricuspid. Aortic valve regurgitation is not visualized. No aortic stenosis is present.  Pulmonic Valve: The pulmonic valve was not well visualized. Pulmonic valve regurgitation is not visualized. No evidence of pulmonic stenosis.  Aorta: The aortic root, ascending aorta, aortic arch and descending aorta are all structurally normal, with no evidence of dilitation or obstruction.  Venous: The inferior vena cava is normal in size with greater than 50% respiratory variability, suggesting right atrial pressure of 3 mmHg.  IAS/Shunts: No atrial level shunt detected by color flow Doppler.   LEFT VENTRICLE PLAX 2D LVIDd:         4.00 cm   Diastology LVIDs:         2.80 cm   LV e' medial:    8.09 cm/s LV PW:         0.90 cm   LV E/e' medial:  8.9 LV IVS:        0.90 cm   LV e' lateral:   8.52 cm/s LVOT diam:     2.00 cm   LV E/e' lateral: 8.5 LV SV:         85 LV SV Index:    48 LVOT Area:     3.14 cm   RIGHT VENTRICLE             IVC RV Basal diam:  3.50 cm     IVC diam: 1.60 cm RV S prime:     17.15 cm/s TAPSE (M-mode): 2.4 cm  LEFT ATRIUM             Index        RIGHT ATRIUM           Index LA diam:        3.70 cm 2.08 cm/m   RA Area:     11.10 cm LA Vol (A2C):   40.9 ml 23.01 ml/m  RA Volume:   27.00 ml  15.19 ml/m LA Vol (A4C):   33.8 ml 19.01 ml/m LA Biplane Vol: 38.4 ml 21.60 ml/m AORTIC VALVE LVOT Vmax:   124.50 cm/s LVOT Vmean:  87.050 cm/s LVOT VTI:    0.271 m  AORTA Ao Root diam: 3.50 cm Ao Asc diam:  3.20 cm  MITRAL VALVE                TRICUSPID VALVE MV Area (PHT): 4.10 cm     TR Peak grad:   24.4 mmHg MV Decel Time: 185 msec     TR Vmax:        247.00 cm/s MV E velocity: 72.30 cm/s MV A velocity: 101.90 cm/s  SHUNTS MV E/A ratio:  0.71         Systemic VTI:  0.27 m Systemic Diam: 2.00 cm  Jodelle Red MD Electronically signed by Jodelle Red MD Signature Date/Time: 02/22/2022/8:22:30 AM    Final      CT SCANS  CT CORONARY MORPH W/CTA COR W/SCORE 07/11/2022  Addendum 07/12/2022  7:41 PM ADDENDUM REPORT: 07/12/2022 19:38  EXAM: OVER-READ INTERPRETATION  CT CHEST  The following report is an over-read performed by radiologist Dr. Alcide Clever of [facility] on 07/12/2022. This over-read does not include interpretation of cardiac or coronary anatomy or pathology. The coronary calcium score/coronary CTA interpretation by the  cardiologist is attached.  COMPARISON:  None.  FINDINGS: Cardiovascular: Atherosclerotic calcifications of the thoracic aorta are noted without aneurysmal dilatation. Pulmonary artery shows no evidence of pulmonary embolism.  Mediastinum/Nodes: There are no enlarged lymph nodes within the visualized mediastinum.Esophagus as visualized is within normal limits.  Lungs/Pleura: There is no pleural effusion. The visualized lungs appear clear.  Upper abdomen:  Visualized upper abdomen is within normal limits.  Musculoskeletal/Chest wall: No chest wall mass or suspicious osseous findings within the visualized chest.  IMPRESSION: Aortic Atherosclerosis (ICD10-I70.0).  No other significant extracardiac findings are noted.   Electronically Signed By: Alcide Clever M.D. On: 07/12/2022 19:38  Narrative CLINICAL DATA:  78 year old with chest pain  EXAM: Cardiac/Coronary  CTA  TECHNIQUE: The patient was scanned on a [employer].  FINDINGS: A 120 kV prospective scan was triggered in the descending thoracic aorta at 111 HU's. Axial non-contrast 3 mm slices were carried out through the heart. The data set was analyzed on a dedicated work station and scored using the Agatson method. Gantry rotation speed was 250 msecs and collimation was .6 mm. 0.8 mg of sl NTG was given. The 3D data set was reconstructed in 5% intervals of the 67-82 % of the R-R cycle. Diastolic phases were analyzed on a dedicated work station using MPR, MIP and VRT modes. The patient received 80 cc of contrast.  Image quality: Fair, misregistration artifact, however interpretable.  Aorta: Normal size (34 mm ascending). No calcifications. No dissection.  Aortic Valve:  Trileaflet.  No calcifications.  Coronary Arteries:  Normal coronary origin.  Right dominance.  RCA is a large dominant artery that gives rise to PDA and PLA. There is no plaque. PDA is small caliber and underfilled.  Left main is a large artery that gives rise to LAD and LCX arteries.  LAD is a large vessel that has ostial calcified plaque, 0-24% stenosis.  D1: Small under filled.  LCX is a non-dominant artery. There is ostial calcified plaque, 0-24% stenosis.  OM1, 2, 3 are small in caliber, no plaque  Other findings:  Normal pulmonary vein drainage into the left atrium.  Normal left atrial appendage without a thrombus.  Normal size of the pulmonary artery.  Please see  radiology report for non cardiac findings.  IMPRESSION: 1. Coronary calcium score of 65. This was 4 percentile for age and sex matched control.  2. Normal coronary origin with right dominance.  3. Minimal non obstructive CAD (Ostial LAD and Circumflex calcified plaque, 0-24% stenosis).  4. Small PFO  CAD-RADS 1. Minimal non-obstructive CAD (0-24%). Consider non-atherosclerotic causes of chest pain. Consider preventive therapy and risk factor modification.  Electronically Signed: By: Donato Schultz M.D. On: 07/11/2022 11:32     ______________________________________________________________________________________________       EKG:  EKG is personally reviewed. NSR, nonspecific T wave changes, HR 90bpm   Recent Labs: 07/03/2022: BNP 48.0; BUN 16; Creatinine, Ser 0.93; Potassium 4.4; Sodium 141 03/09/2023: Hemoglobin 14.1; Platelets 307   Recent Lipid Panel    Component Value Date/Time   CHOL 157 02/08/2022 1021   TRIG 105 02/08/2022 1021   HDL 77 02/08/2022 1021   CHOLHDL 2.0 02/08/2022 1021   LDLCALC 61 02/08/2022 1021     Risk Assessment/Calculations:      No BP recorded.  {Refresh Note OR Click here to enter BP  :1}***         Physical Exam:    VS:  There were no vitals taken for this  visit.    Wt Readings from Last 3 Encounters:  03/09/23 182 lb 11.2 oz (82.9 kg)  01/12/23 176 lb 14.4 oz (80.2 kg)  11/23/22 175 lb (79.4 kg)     GEN: Comfortable HEENT: Normal NECK: No JVD; No carotid bruits CARDIAC: RRR, 2/6 systolic murmur. No rubs or gallops RESPIRATORY:  Clear to auscultation without rales, wheezing or rhonchi  ABDOMEN: Soft, non-tender, non-distended MUSCULOSKELETAL:  Warm, trace edema. SKIN: Warm and dry NEUROLOGIC:  Alert and oriented x 3 PSYCHIATRIC:  Normal affect   ASSESSMENT:    No diagnosis found.  PLAN:    In order of problems listed above:  #Coronary Artery Disease: -Coronary CTA with minimal nonobstructive CAD -Continue ASA  81mg  daily -Continue crestor 5mg  daily  #HTN: Running mainly high 130-140s at home -Increae amlodipine to 5mg  daily -Continue metop 50mg  XL daily  #LE Edema: Chronic and stable. TTE 02/2022 with EF 55-60%, normal diastology, normal RV, trivial MR -Continue lasix 20mg  daily       Follow-up:  6 months.  Medication Adjustments/Labs and Tests Ordered: Current medicines are reviewed at length with the patient today.  Concerns regarding medicines are outlined above.   No orders of the defined types were placed in this encounter.  No orders of the defined types were placed in this encounter.  There are no Patient Instructions on file for this visit.    Signed, Dietrich Pates, MD  06/24/2023 2:18 PM    [facility]

## 2023-06-25 ENCOUNTER — Ambulatory Visit: Payer: 59 | Admitting: Internal Medicine

## 2023-07-20 ENCOUNTER — Other Ambulatory Visit: Payer: Self-pay | Admitting: Physician Assistant

## 2023-07-20 DIAGNOSIS — Z13820 Encounter for screening for osteoporosis: Secondary | ICD-10-CM

## 2023-07-25 ENCOUNTER — Ambulatory Visit: Admitting: Orthopedic Surgery

## 2023-08-08 ENCOUNTER — Encounter: Payer: Self-pay | Admitting: Orthopedic Surgery

## 2023-08-08 ENCOUNTER — Ambulatory Visit (INDEPENDENT_AMBULATORY_CARE_PROVIDER_SITE_OTHER): Admitting: Orthopedic Surgery

## 2023-08-08 ENCOUNTER — Other Ambulatory Visit (INDEPENDENT_AMBULATORY_CARE_PROVIDER_SITE_OTHER)

## 2023-08-08 DIAGNOSIS — M25561 Pain in right knee: Secondary | ICD-10-CM

## 2023-08-08 DIAGNOSIS — M25562 Pain in left knee: Secondary | ICD-10-CM

## 2023-08-08 DIAGNOSIS — M170 Bilateral primary osteoarthritis of knee: Secondary | ICD-10-CM

## 2023-08-08 NOTE — Progress Notes (Signed)
 Office Visit Note   Patient: [name]           Date of Birth: July 22, 1945           MRN: [id number] Visit Date: 08/08/2023 Requested by: Deborah Plum, MD [id number] [place],  [place] [address] PCP: Deborah Plum, MD  Subjective: Chief Complaint  Patient presents with   Right Knee - Pain   Left Knee - Pain    HPI: [name] is a 78 y.o. female who presents to the office reporting bilateral knee pain left worse than right.  Describes chronic pain in both knees.  The pain wakes her from sleep at night.  Relatively constant.  She does have likely neck surgery pending.  She has done flex agenic's in the past..                ROS: All systems reviewed are negative as they relate to the chief complaint within the history of present illness.  Patient denies fevers or chills.  Assessment & Plan: Visit Diagnoses:  1. 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  End-stage arthritis is present in both knees.  Radiographically it is a little bit worse on the left-hand side.  Bilateral knees were aspirated and injected today.  Will see if that can buy some time.  I think her neck issues are more pressing than her knee issues at this time.  Follow-Up Instructions: No follow-ups on file.   Orders:  Orders Placed This Encounter  Procedures   XR KNEE 3 VIEW RIGHT   XR KNEE 3 VIEW LEFT   No orders of the defined types were placed in this encounter.     Procedures: Large Joint Inj: bilateral knee on 08/08/2023 1:36 PM Indications: diagnostic evaluation, joint swelling and pain Details: 18 G 1.5 in needle, superolateral approach  Arthrogram: No  Medications (Right): 5 mL lidocaine 1 %; 4 mL bupivacaine 0.25 %; 40 mg methylPREDNISolone acetate 40 MG/ML Medications (Left): 5 mL lidocaine 1 %; 4 mL bupivacaine 0.25 %; 40 mg methylPREDNISolone acetate 40 MG/ML Outcome: tolerated  well, no immediate complications Procedure, treatment alternatives, risks and benefits explained, specific risks discussed. Consent was given by the patient. Immediately prior to procedure a time out was called to verify the correct patient, procedure, equipment, support staff and site/side marked as required. Patient was prepped and draped in the usual sterile fashion.       Clinical Data: No additional findings.  Objective: Vital Signs: There were no vitals taken for this visit.  Physical Exam:  Constitutional: Patient appears well-developed HEENT:  Head: Normocephalic Eyes:EOM are normal Neck: Normal range of motion Cardiovascular: Normal rate Pulmonary/chest: Effort normal Neurologic: Patient is alert Skin: Skin is warm Psychiatric: Patient has normal mood and affect  Ortho Exam: Ortho exam demonstrates perfused and sensate feet.  She has good ankle dorsiflexion and plantarflexion strength.  Flexion contracture on the left is slightly more than 10 degrees.  Slightly less than 10 degrees on the right.  Effusion is present in both knees.  No groin pain with internal/external Tatian of either leg.  Extensor mechanism is intact bilaterally  Specialty Comments:  MRI CERVICAL SPINE WITHOUT CONTRAST   TECHNIQUE: Multiplanar, multisequence MR imaging of the cervical spine was performed. No intravenous contrast was administered.   COMPARISON:  11/20/2013   FINDINGS: Patient motion degrades image quality limiting evaluation.  Alignment: 3 mm anterolisthesis of C4 on C5.   Vertebrae: No acute fracture, evidence of discitis, or aggressive bone lesion. C4 vertebral body hemangioma.   Cord: Normal signal and morphology.   Posterior Fossa, vertebral arteries, paraspinal tissues: Posterior fossa demonstrates no focal abnormality. Vertebral artery flow voids are maintained. Paraspinal soft tissues are unremarkable.   Disc levels:   Discs: Degenerative disease with disc height  loss throughout the cervical and upper thoracic spine.   C2-3: Broad-based disc bulge. Mild bilateral facet arthropathy. No left foraminal stenosis. Severe right foraminal stenosis. Mild spinal stenosis.   C3-4: Broad-based disc bulge with a broad central disc protrusion. Moderate right and mild left facet arthropathy. Mild left foraminal stenosis. Severe right foraminal stenosis. Severe spinal stenosis.   C4-5: No disc protrusion. Moderate bilateral facet arthropathy. Bilateral uncovertebral degenerative changes. Severe bilateral foraminal stenosis. Moderate spinal stenosis.   C5-6: Broad-based disc bulge. Mild right facet arthropathy. Bilateral uncovertebral degenerative changes. Severe bilateral foraminal stenosis. No spinal stenosis.   C6-7: Broad-based disc osteophyte complex. Bilateral uncovertebral degenerative changes. Mild right foraminal stenosis. Severe left foraminal stenosis. Mild spinal stenosis.   C7-T1: Mild broad-based disc bulge. No foraminal or central canal stenosis.   On the sagittal images there broad-based disc bulges at T1-2, T2-3, T3-4 and T4-5. Severe left and moderate right foraminal stenosis at T1-2. Moderate bilateral foraminal stenosis at T2-3.   IMPRESSION: 1. Diffuse cervical spine spondylosis as described above. 2. No acute osseous injury of the cervical spine.     Electronically Signed   By: Elige Ko M.D.   On: 09/18/2022 11:13  Imaging: XR KNEE 3 VIEW LEFT Result Date: 08/08/2023 AP lateral merchant radiographs left knee reviewed.  Severe end-stage arthritis is noted with bone-on-bone changes in the medial compartment and moderate varus alignment.  No acute fracture.  XR KNEE 3 VIEW RIGHT Result Date: 08/08/2023 AP axillary outlet radiographs right knee reviewed.  Severe end-stage tricompartmental arthritis is present worse in the medial side.  No acute fracture.  Varus alignment noted    PMFS History: Patient Active Problem List    Diagnosis Date Noted   Moderate persistent asthma with acute exacerbation 12/05/2017   Perennial allergic rhinitis 09/20/2017   MDD (major depressive disorder) 07/31/2017   Insomnia 02/23/2015   OSA (obstructive sleep apnea) 02/23/2015   Asthma 02/23/2015   Chest pain on exertion 04/10/2013   Menopausal hot flushes 01/22/2013   Frequency of urination 01/22/2013   Past Medical History:  Diagnosis Date   Allergy    Arthritis    Asthma    CAD (coronary artery disease)    Chronic headaches    Depression    Diabetes mellitus, type II (HCC)    Diverticulosis    Endometriosis    GERD (gastroesophageal reflux disease)    Heart attack (HCC)    High cholesterol    Hypertension    Mallory-Weiss tear 2003   Mood swings    Rheumatoid arthritis (HCC)    Vitamin D deficiency     Family History  Problem Relation Age of Onset   Cancer Mother    Heart disease Mother    Cancer Father    Heart disease Father     Past Surgical History:  Procedure Laterality Date   ABDOMINAL HYSTERECTOMY     EXPLORATORY LAPAROTOMY     I & D EXTREMITY Left 08/04/2017   Procedure: IRRIGATION AND DEBRIDEMENT HAND;  Surgeon: Betha Loa, MD;  Location: [facility] OR;  Service: Orthopedics;  Laterality:  Left;   LEFT HEART CATHETERIZATION WITH CORONARY ANGIOGRAM N/A 04/10/2013   Procedure: LEFT HEART CATHETERIZATION WITH CORONARY ANGIOGRAM;  Surgeon: Ricki Rodriguez, MD;  Location: [facility] CATH LAB;  Service: Cardiovascular;  Laterality: N/A;   Social History   Occupational History   Not on file  Tobacco Use   Smoking status: Former    Types: Cigarettes   Smokeless tobacco: Never  Vaping Use   Vaping status: Never Used  Substance and Sexual Activity   Alcohol use: Yes    Comment: occasional   Drug use: No   Sexual activity: Never

## 2023-08-09 ENCOUNTER — Telehealth: Payer: Self-pay | Admitting: Orthopedic Surgery

## 2023-08-09 NOTE — Telephone Encounter (Signed)
 Pt wanted to thank Dr August Saucer for all he does and to let him know the injections work Adult nurse. She is not hurting nor does she has to use a cane or anything to walk with at this time.

## 2023-08-10 NOTE — Telephone Encounter (Signed)
Thx for letting me know

## 2023-08-11 MED ORDER — LIDOCAINE HCL 1 % IJ SOLN
5.0000 mL | INTRAMUSCULAR | Status: AC | PRN
Start: 2023-08-08 — End: 2023-08-08
  Administered 2023-08-08: 5 mL

## 2023-08-11 MED ORDER — BUPIVACAINE HCL 0.25 % IJ SOLN
4.0000 mL | INTRAMUSCULAR | Status: AC | PRN
Start: 2023-08-08 — End: 2023-08-08
  Administered 2023-08-08: 4 mL via INTRA_ARTICULAR

## 2023-08-11 MED ORDER — METHYLPREDNISOLONE ACETATE 40 MG/ML IJ SUSP
40.0000 mg | INTRAMUSCULAR | Status: AC | PRN
Start: 2023-08-08 — End: 2023-08-08
  Administered 2023-08-08: 40 mg via INTRA_ARTICULAR

## 2023-08-14 ENCOUNTER — Ambulatory Visit: Admitting: Orthopedic Surgery

## 2023-08-14 ENCOUNTER — Other Ambulatory Visit (INDEPENDENT_AMBULATORY_CARE_PROVIDER_SITE_OTHER): Payer: Self-pay

## 2023-08-14 DIAGNOSIS — M5412 Radiculopathy, cervical region: Secondary | ICD-10-CM | POA: Diagnosis not present

## 2023-08-14 NOTE — Progress Notes (Signed)
 Orthopedic Spine Surgery Office Note  Assessment: Patient is a 78 y.o. female with neck pain that radiates into the bilateral shoulders and anterior arm to the level of the elbow   Plan: -Explained that initially conservative treatment is tried as a significant number of patients may experience relief with these treatment modalities. 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 Accordingly, recommended getting the DEXA scan so her bone density could be optimized prior to any elective cervical spine surgery -Given her distribution of pain, I suspect her pain is coming from the stenosis at C3/4 and C4/5 -Patient should return to office in 4 weeks, x-rays at next visit: none   Patient expressed understanding of the plan and all questions were answered to the patient's satisfaction.   ___________________________________________________________________________   History:  Patient is a 78 y.o. female who presents today for cervical spine.  Patient has had neck pain radiating to bilateral shoulders and anterior arms for over a year now.  There is no trauma or injury that preceded onset of pain.  She notes the pain with activity and at rest.  She has also had difficulty with her right shoulder especially with overhead activities.  She has seen my partner, Dr. August Saucer, about this problem.  She does have a rotator cuff tear on that side.  She does not have any functional issues with her left shoulder.  She has tried several conservative treatments but has not noticed any lasting relief with those.   Weakness: Yes, right shoulder feels weaker.  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  No other weakness noted Difficulty with fine motor skills (e.g., buttoning shirts, handwriting): Denies Symptoms of imbalance: Yes, has noticed some issues with imbalance but mostly due to lightheadedness when getting up from a seated position.  Does not use any ambulatory assistive devices Paresthesias and numbness: Denies Bowel or bladder incontinence: Denies Saddle anesthesia: Denies  Treatments tried: Tylenol, ibuprofen, lidocaine patches, oral prednisone, tramadol  Review of systems: Denies fevers and chills, night sweats, unexplained weight loss, history of cancer, pain that wakes them at night  Past medical history: Hyperlipidemia Hypertension Coronary disease History of MI Depression/anxiety GERD COPD RA Chronic pain Sleep apnea History of stroke DM (last A1c was 5.3 on 07/03/2022)   Allergies: NKDA   Past surgical history:  Hysterectomy Exploratory laparotomy I&D left upper extremity   Social history: Denies use of nicotine product (smoking, vaping, patches, smokeless) Alcohol use: 1 drink per week Denies recreational drug use   Physical Exam:  BMI of 29.4  General: no acute distress, appears stated age Neurologic: alert, answering questions appropriately, following commands Respiratory: unlabored breathing on room air, symmetric chest rise Psychiatric: appropriate affect, normal cadence to speech   MSK (spine):  -Strength exam      Left  Right Grip strength                0/5  5/5 Interosseus   0/5   5/5 Wrist extension  0/5  5/5 Wrist flexion   0/5  5/5 Elbow flexion   4/5  5/5 Deltoid    4/5  4/5  EHL    4/5  4/5 TA  5/5  5/5 GSC    5/5  5/5 Knee extension  5/5  5/5 Hip flexion   5/5  5/5  -Sensory exam    Sensation intact to light touch in L2-S1 nerve distributions of bilateral lower extremities  Sensation intact to light touch in C4-T1 nerve distributions of bilateral upper  extremities  -Brachioradialis DTR: 0/4 on the left, 1/4 on the right -Biceps DTR: 0/4 on the left, 1/4 on the right -Triceps DTR: 1/4 on the left, 1/4 on the right -Achilles DTR: 1/4 on the left, 1/4 on the right -Patellar tendon DTR: 2/4 on the left, 2/4 on the right  -Spurling: Negative bilaterally -Hoffman sign: Negative bilaterally -Clonus: No beats bilaterally -Interosseous wasting: Yes, seen on the left.  None seen on the right -Grip and release test: Negative on the right -Romberg: Negative -Gait: Normal  Left shoulder exam: No pain through range of motion, negative Jobe, no weakness with external rotation with arm at side, negative belly press Right shoulder exam: Pain with external rotation past 60 degrees, pain and slight weakness with Jobe test, no weakness with external rotation with arm at side, negative belly press  Tinel's at wrist: Negative bilaterally Phalen's at wrist: Negative bilaterally Durkan's: Negative bilaterally  Tinel's at elbow: Negative bilaterally  Imaging: XRs of the cervical spine from 08/14/2023 were independently reviewed and interpreted, showing disc height loss at C3/4, C4/5, C5/6, C6/7. Anterior osteophyte formation at C5/6 and C6/7.  No fracture or dislocation seen.  No evidence of instability on flexion/extension views.  MRI of the cervical spine from 09/13/2022 was independently reviewed and interpreted, showing central stenosis and bilateral foraminal stenosis at C3/4. Central stenosis and bilateral foraminal stenosis at C4/5. Foraminal stenosis on the right at C5/6. Bilateral foraminal stenosis at C6/7.    Patient name: [name] Patient MRN: [id number] Date of visit: 08/14/23

## 2023-08-22 ENCOUNTER — Ambulatory Visit
Admission: RE | Admit: 2023-08-22 | Discharge: 2023-08-22 | Disposition: A | Discharge status: Home / Self Care | Source: Ambulatory Visit | Attending: Physician Assistant | Admitting: Physician Assistant

## 2023-08-22 DIAGNOSIS — Z13820 Encounter for screening for osteoporosis: Secondary | ICD-10-CM

## 2023-09-10 NOTE — Progress Notes (Signed)
 Cardiology Office Note    Patient Name: [name] Date of Encounter: 09/10/2023  Primary Care Provider:  Tretha Fu, MD Primary Cardiologist:  Sonny Dust, MD (Inactive) Primary Electrophysiologist: None   Past Medical History    Past Medical History:  Diagnosis Date   Allergy    Arthritis    Asthma    CAD (coronary artery disease)    Chronic headaches    Depression    Diabetes mellitus, type II (HCC)    Diverticulosis    Endometriosis    GERD (gastroesophageal reflux disease)    Heart attack (HCC)    High cholesterol    Hypertension    Mallory-Weiss tear 2003   Mood swings    Rheumatoid arthritis (HCC)    Vitamin D deficiency     History of Present Illness  [name] is a 78 y.o. female with a PMH of HTN, HLD, GERD, RA, DM type II, CAD s/p MI in 2014, COPD with no significant disease per LHC, asthma, depression who presents today for follow up.   [name] was seen initially by Dr. Ardell Beauvais in 02/2022 for evaluation of chest pain and lower extremity edema.  She was noted to have remote history of MI in 2014 with left heart cath completed showing no significant coronary disease.  She underwent a TTE in 2019 that showed normal EF with no significant valve disease.  She also experienced some pedal edema and 2D echo was completed that showed an EF of 55 to 60% with trivial MR.  She underwent a cardiac CTA on 07/11/2022 that noted aortic atherosclerosis with calcium  score of 65 placing her at 64 percentile with nonobstructive disease present in the LAD and circumflex (0-24% stenosis) with incidental finding of a small PFO.  She was started on ASA 81 mg and rosuvastatin  5 mg.  She was seen in follow-up by Neomi Banks, NP on 07/31/2022 and BP was noted to be elevated in the 140s to 150s systolically.  She had metoprolol  increased to 50 mg daily.  She was last seen by Dr. Ardell Beauvais on 11/13/2022 and patient reported small amount of chest pain after eating  lunch.  She reported being an active and continue to have dyspnea on exertion which she attributed to her asthma.  Blood pressure during visit was in the 130s to 150s over 80s.  She had amlodipine  increased to 5 mg daily and was continued on Lasix 20 mg daily.  Ms. Parslow was seen today for annual follow-up.  At today's visit she reported no new cardiac complaints or concerns.  She did note experiencing a pulling sensation on the left side of her chest last week, relieved by two doses of nitroglycerin , with no recurrence since. She has been experiencing extreme tiredness and headaches for about three weeks. Her blood pressure at home ranges from 140 to 150 mmHg and lowers with rest and sleep. She has a history of asthma and allergies, with recent evaluations indicating low blood oxygen levels and low blood count, though recent follow-up showed normalization. Fatigue is her biggest problem currently. She is on metoprolol , amlodipine , and Lasix for her cardiac condition. Patient denies chest pain, palpitations, dyspnea, PND, orthopnea, nausea, vomiting, dizziness, syncope, edema, weight gain, or early satiety.  Discussed the use of AI scribe software for clinical note transcription with the patient, who gave verbal consent to proceed.  History of Present Illness    Review of Systems  Please see the history of present illness.  All other systems reviewed and are otherwise negative except as noted above.  Physical Exam    Wt Readings from Last 3 Encounters:  03/09/23 182 lb 11.2 oz (82.9 kg)  01/12/23 176 lb 14.4 oz (80.2 kg)  11/23/22 175 lb (79.4 kg)   ZO:[id number] were no vitals filed for this visit.,There is no height or weight on file to calculate BMI. GEN: Well nourished, well developed in no acute distress Neck: No JVD; No carotid bruits Pulmonary: Clear to auscultation without rales, wheezing or rhonchi  Cardiovascular: Normal rate. Regular rhythm. Normal S1. Normal S2.   Murmurs: There  is no murmur.  ABDOMEN: Soft, non-tender, non-distended EXTREMITIES:  No edema; No deformity   EKG/LABS/ Recent Cardiac Studies   ECG personally reviewed by me today -none completed today  Risk Assessment/Calculations:          Lab Results  Component Value Date   WBC 5.4 03/09/2023   HGB 14.1 03/09/2023   HCT 42.2 03/09/2023   MCV 97 03/09/2023   PLT 307 03/09/2023   Lab Results  Component Value Date   CREATININE 0.93 07/03/2022   BUN 16 07/03/2022   NA 141 07/03/2022   K 4.4 07/03/2022   CL 105 07/03/2022   CO2 17 (L) 07/03/2022   Lab Results  Component Value Date   CHOL 157 02/08/2022   HDL 77 02/08/2022   LDLCALC 61 02/08/2022   TRIG 105 02/08/2022   CHOLHDL 2.0 02/08/2022    Lab Results  Component Value Date   HGBA1C 5.3 07/03/2022   Assessment & Plan    Assessment & Plan     1.  Nonobstructive CAD: -cardiac CTA on 07/11/2022 that noted aortic atherosclerosis and minimal coronary plaque. - Today she reports intermittent chest pain resolved with nitroglycerin . No recurrence since last week. Differential includes cardiac and non-cardiac causes. - Monitor for recurrence of chest pain. - Consider stress test if chest pain recurs.  2.  Essential hypertension: - Patient's blood pressure controlled at 132/80 - She reports ongoing fatigue - We will decrease metoprolol  to 25 mg - Will increase amlodipine  to 7.5 mg - Patient will check blood pressures over the next 2 weeks and submit readings to the office.  3.  Hyperlipidemia: - Patient's LDL was noted to be elevated at 101 per lab review - We will increase Crestor  to 10 mg daily - We will check LFT and lipids and 8 weeks.  4.  Fatigue: Persistent fatigue potentially related to medication side effects. Metoprolol  identified as a possible contributor. - Decrease metoprolol  to 25 mg to reduce fatigue.   Disposition: Follow-up with Sonny Dust, MD (Inactive) or APP in 6 months    Signed, Francene Ing,  Retha Cast, NP 09/10/2023, 9:09 PM  [facility]

## 2023-09-11 ENCOUNTER — Encounter: Payer: Self-pay | Admitting: Nurse Practitioner

## 2023-09-11 ENCOUNTER — Ambulatory Visit: Attending: Nurse Practitioner | Admitting: Nurse Practitioner

## 2023-09-11 VITALS — BP 132/80 | HR 68 | Ht 59.0 in | Wt 192.2 lb

## 2023-09-11 DIAGNOSIS — I25118 Atherosclerotic heart disease of native coronary artery with other forms of angina pectoris: Secondary | ICD-10-CM | POA: Diagnosis not present

## 2023-09-11 DIAGNOSIS — Z79899 Other long term (current) drug therapy: Secondary | ICD-10-CM

## 2023-09-11 DIAGNOSIS — I10 Essential (primary) hypertension: Secondary | ICD-10-CM | POA: Diagnosis not present

## 2023-09-11 MED ORDER — AMLODIPINE BESYLATE 5 MG PO TABS: 7.5000 mg | ORAL_TABLET | Freq: Every day | ORAL | 0 refills | Status: DC

## 2023-09-11 MED ORDER — ROSUVASTATIN CALCIUM 10 MG PO TABS: 10.0000 mg | ORAL_TABLET | Freq: Every day | ORAL | 1 refills | Status: AC

## 2023-09-11 MED ORDER — METOPROLOL SUCCINATE ER 25 MG PO TB24: 25.0000 mg | ORAL_TABLET | Freq: Every day | ORAL | 1 refills | Status: DC

## 2023-09-11 NOTE — Patient Instructions (Addendum)
 Medication Instructions:  DECREASE Metoprolol  25mg  Take 1 tablet once a day  INCREASE Amlodipine  to 7.5mg  Take 1.5 tablets once a day  *If you need a refill on your cardiac medications before your next appointment, please call your pharmacy*  Lab Work: None ordered If you have labs (blood work) drawn today and your tests are completely normal, you will receive your results only by: MyChart Message (if you have MyChart) OR A paper copy in the mail If you have any lab test that is abnormal or we need to change your treatment, we will call you to review the results.  Testing/Procedures: None ordered  Follow-Up: At [facility], you and your health needs are our priority.  As part of our continuing mission to provide you with exceptional heart care, our providers are all part of one team.  This team includes your primary Cardiologist (physician) and Advanced Practice Providers or APPs (Physician Assistants and Nurse Practitioners) who all work together to provide you with the care you need, when you need it.  Your next appointment:   6 month(s)  Provider:   Dr Tisha Forget  We recommend signing up for the patient portal called "MyChart".  Sign up information is provided on this After Visit Summary.  MyChart is used to connect with patients for Virtual Visits (Telemedicine).  Patients are able to view lab/test results, encounter notes, upcoming appointments, etc.  Non-urgent messages can be sent to your provider as well.   To learn more about what you can do with MyChart, go to [email or web address].   Other Instructions Check your blood pressure daily for 2 weeks, then contact the office with your readings.  Contact the office either by phone or MyChart with your readings.  Make sure to check your blood pressure 2 hours after taking your medications.   AVOID these things for 30 minutes before checking your blood pressure: No Drinking caffeine. No Drinking alcohol. No Eating. No  Smoking. No Exercising.  Five minutes before checking your blood pressure: Pee. Sit in a dining chair. Avoid sitting in a soft couch or armchair. Be quiet. Do not talk.

## 2023-09-12 ENCOUNTER — Ambulatory Visit (INDEPENDENT_AMBULATORY_CARE_PROVIDER_SITE_OTHER): Admitting: Orthopedic Surgery

## 2023-09-12 DIAGNOSIS — M5412 Radiculopathy, cervical region: Secondary | ICD-10-CM | POA: Diagnosis not present

## 2023-09-12 NOTE — Progress Notes (Signed)
 Orthopedic Spine Surgery Office Note   Assessment: Patient is a 78 y.o. female with neck pain that radiates into the bilateral shoulders and anterior arm to the level of the elbow     Plan: -Patient has tried Tylenol , ibuprofen, lidocaine  patches, oral prednisone , tramadol  -Patient is doing much better now with minimal pain, so recommended no further intervention at this time -Given her prior distribution of pain, I suspect her pain is coming from the stenosis at C3/4 and C4/5 -Patient should return to the office on an as needed basis     Patient expressed understanding of the plan and all questions were answered to the patient's satisfaction.    ___________________________________________________________________________     History:   Patient is a 78 y.o. female who presents today for follow up on her cervical spine.  Patient had been previously seen in the office with neck pain that radiates into the bilateral shoulders and anterior arms.  She had significant pain at the time.  She said since she was last seen in the office, her pain has gotten significantly better.  She said she is having minimal pain.  She has not noticed any new weakness.  Has not had any new difficulty with fine motor skills or unsteadiness with gait.  She has not noticed any new symptoms since she was last seen in the office.   Treatments tried: Tylenol , ibuprofen, lidocaine  patches, oral prednisone , tramadol     Physical Exam:   General: no acute distress, appears stated age Neurologic: alert, answering questions appropriately, following commands Respiratory: unlabored breathing on room air, symmetric chest rise Psychiatric: appropriate affect, normal cadence to speech     MSK (spine):   -Strength exam                                                   Left                  Right Grip strength                0/5                  5/5 Interosseus                  0/5                  5/5 Wrist extension             0/5                  5/5 Wrist flexion                 0/5                  5/5 Elbow flexion                4/5                  5/5 Deltoid                          4/5                  4/5   EHL  4/5                  4/5 TA                                 5/5                  5/5 GSC                             5/5                  5/5 Knee extension            5/5                  5/5 Hip flexion                    5/5                  5/5   -Sensory exam                           Sensation intact to light touch in L2-S1 nerve distributions of bilateral lower extremities             Sensation intact to light touch in C4-T1 nerve distributions of bilateral upper extremities   -Brachioradialis DTR: 0/4 on the left, 1/4 on the right -Biceps DTR: 0/4 on the left, 1/4 on the right -Triceps DTR: 1/4 on the left, 1/4 on the right -Achilles DTR: 1/4 on the left, 1/4 on the right -Patellar tendon DTR: 2/4 on the left, 2/4 on the right   Left shoulder exam: No pain through range of motion, negative Jobe, no weakness with external rotation with arm at side, negative belly press Right shoulder exam: Pain with external rotation past 70 degrees, pain and slight weakness (4+) with Jobe test, no weakness with external rotation with arm at side, negative belly press   Imaging: XRs of the cervical spine from 08/14/2023 were previously independently reviewed and interpreted, showing disc height loss at C3/4, C4/5, C5/6, C6/7. Anterior osteophyte formation at C5/6 and C6/7.  No fracture or dislocation seen.  No evidence of instability on flexion/extension views.   MRI of the cervical spine from 09/13/2022 was previously independently reviewed and interpreted, showing central stenosis and bilateral foraminal stenosis at C3/4. Central stenosis and bilateral foraminal stenosis at C4/5. Foraminal stenosis on the right at C5/6. Bilateral foraminal stenosis at C6/7.    DEXA  scan from 08/22/2023 showed a T score of -1.5    Patient name: [name] Patient MRN: [id number] Date of visit: 09/12/23

## 2023-09-26 ENCOUNTER — Encounter: Payer: Self-pay | Admitting: Internal Medicine

## 2023-10-10 ENCOUNTER — Telehealth: Payer: Self-pay

## 2023-10-10 NOTE — Telephone Encounter (Signed)
 Good Morning Dr. Rosaline Coma,  This patient has been scheduled for a colonoscopy with you on 7/10.  She is 78 years old.  She saw Reginal Capra on 05/31/22 for an OV.  Will you please review that OV and advise if you are ok to proceed with the colonoscopy or want the patient seen in the office again, etc. Thank you, [name]

## 2023-10-12 IMAGING — MR MR SHOULDER*R* W/O CM
4 of 6 series · 19 of 40 positions shown · non-contrast
Comparison: None.

CLINICAL DATA: Shoulder pain, limited range of motion

EXAM:
MRI OF THE RIGHT SHOULDER WITHOUT CONTRAST
TECHNIQUE: Multiplanar, multisequence MR imaging of the shoulder was performed.
No intravenous contrast was administered.

[Series 6: T2 fat-sat · axial · right · 4.0mm · 0.31mm/px · z∈[-53,+48]mm · 7 of 24 slices shown (1 of 3)]
[im 1/24]
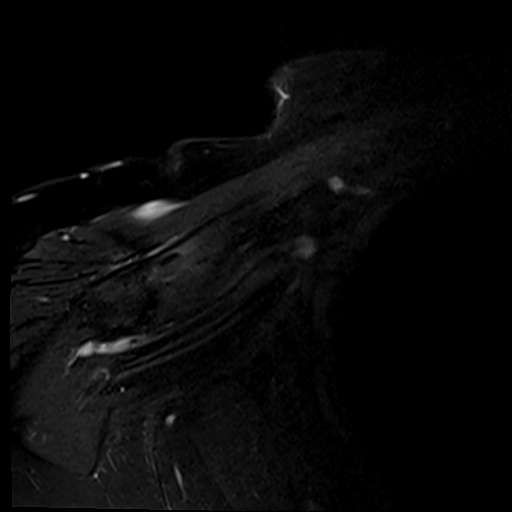
[im 4/24]
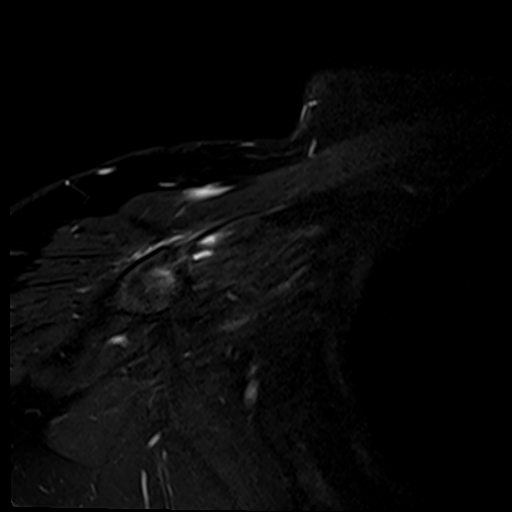
[im 8/24]
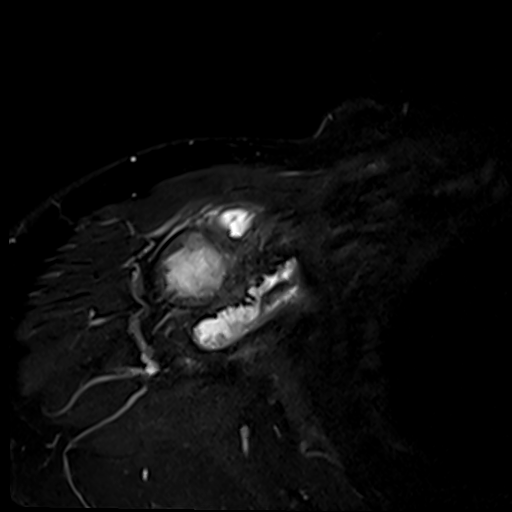
[im 12/24]
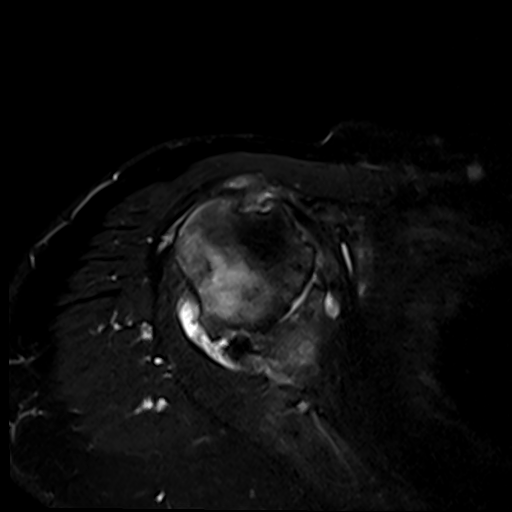
[im 16/24]
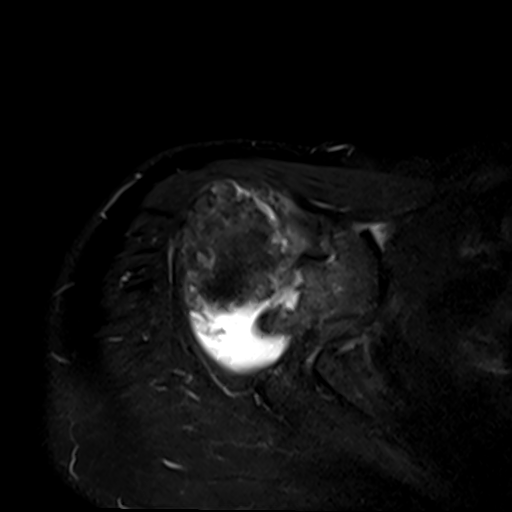
[im 20/24]
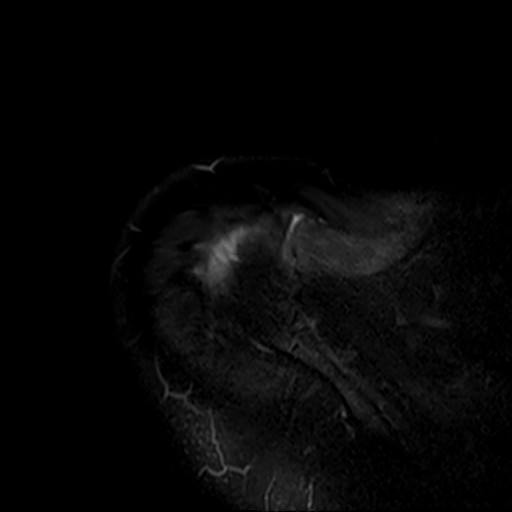
[im 24/24]
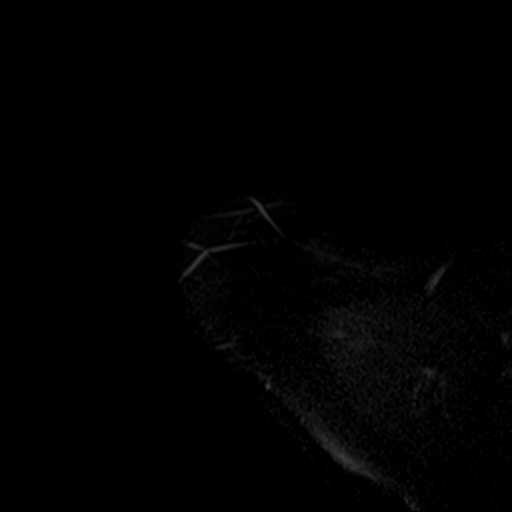

[Series 7: T2 fat-sat · sagittal · right · 4.0mm · 0.31mm/px · 3 of 24 slices shown (2 of 3)]
[im 5/24]
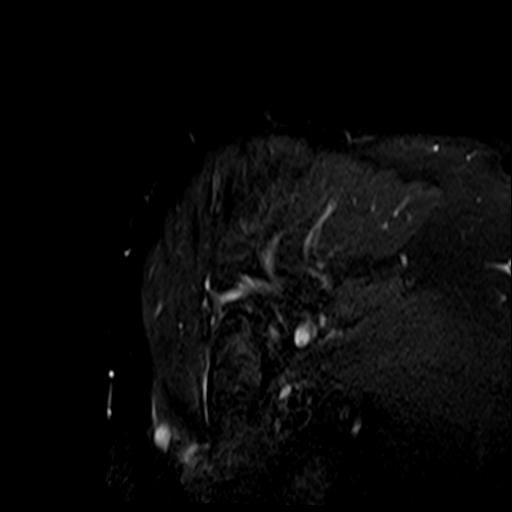
[im 14/24]
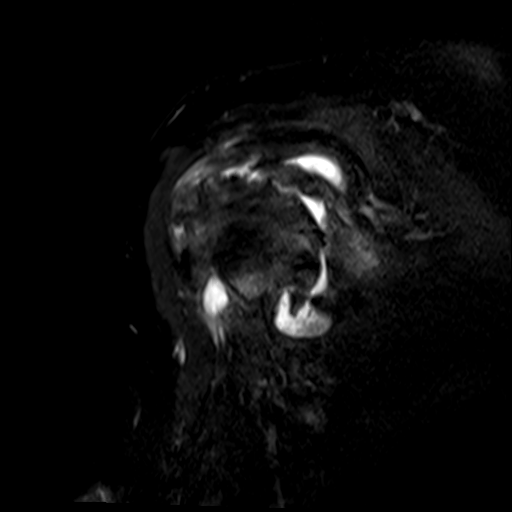
[im 24/24]
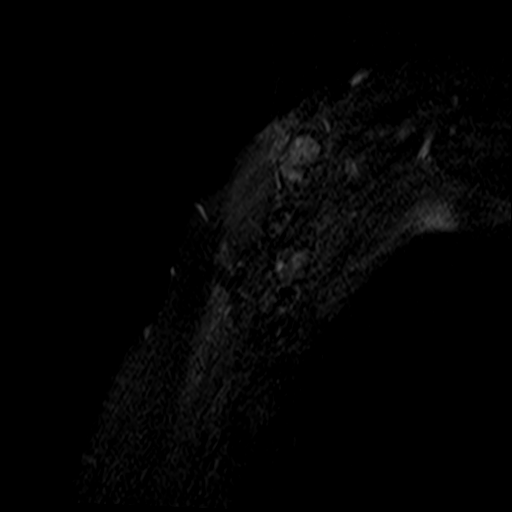

[Series 8: PD · sagittal · right · 4.0mm · 0.31mm/px · 6 of 24 slices shown]
[im 1/24]
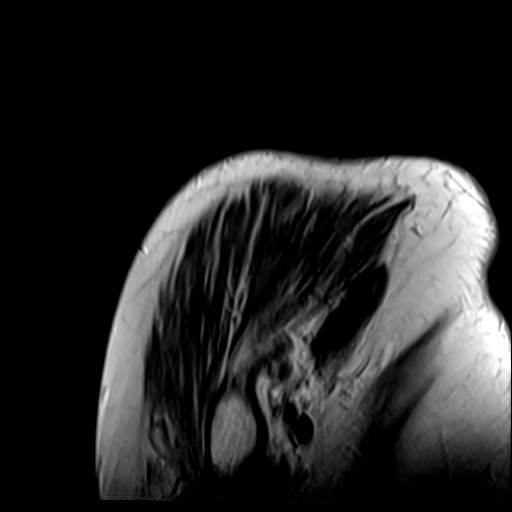
[im 5/24]
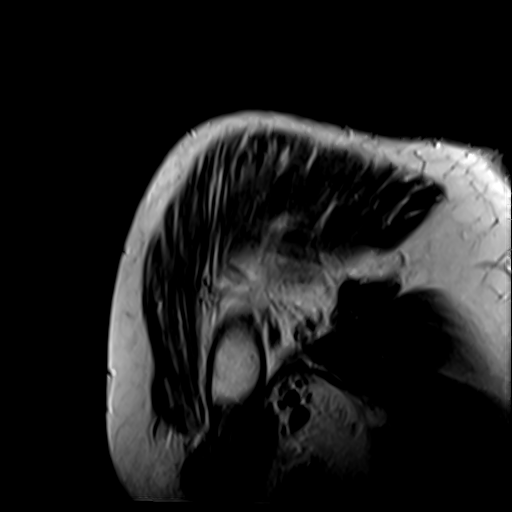
[im 10/24]
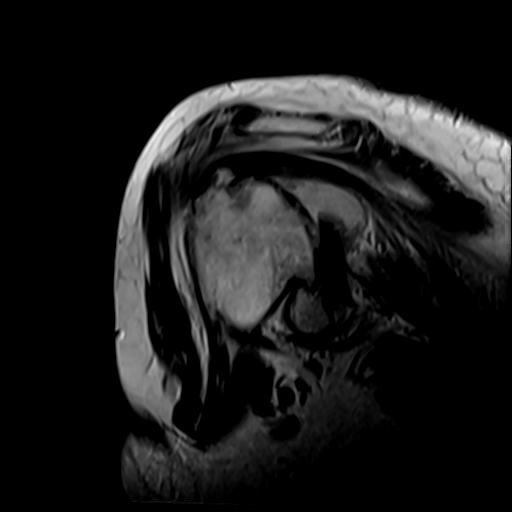
[im 14/24]
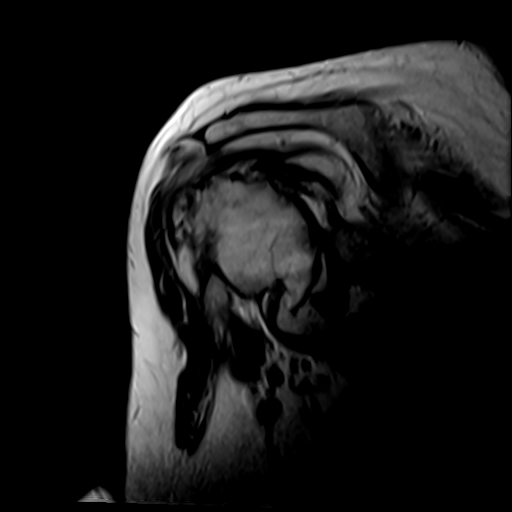
[im 19/24]
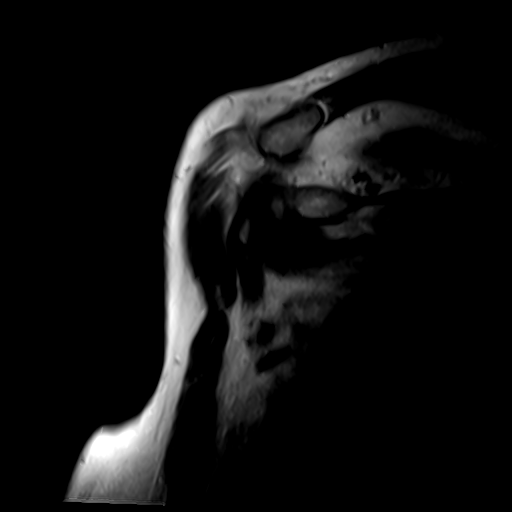
[im 24/24]
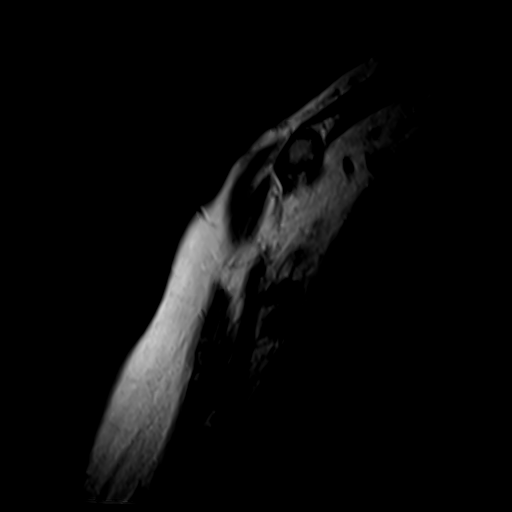

[Series 10: T2 fat-sat · oblique · right · 3.0mm · 0.29mm/px · 3 of 30 slices shown (3 of 3)]
[im 5/30]
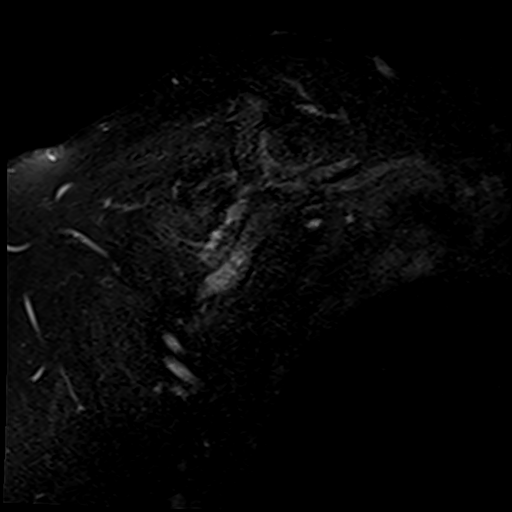
[im 15/30]
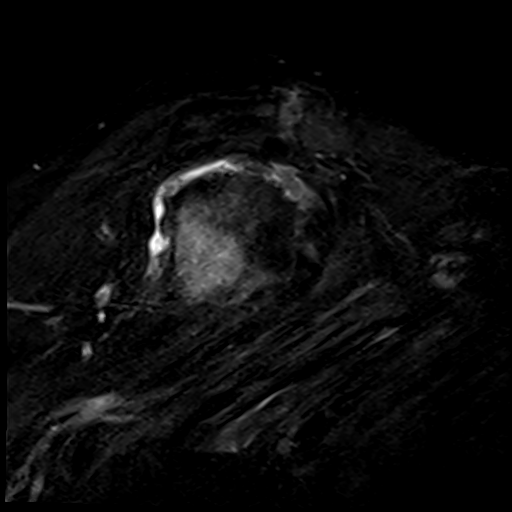
[im 25/30]
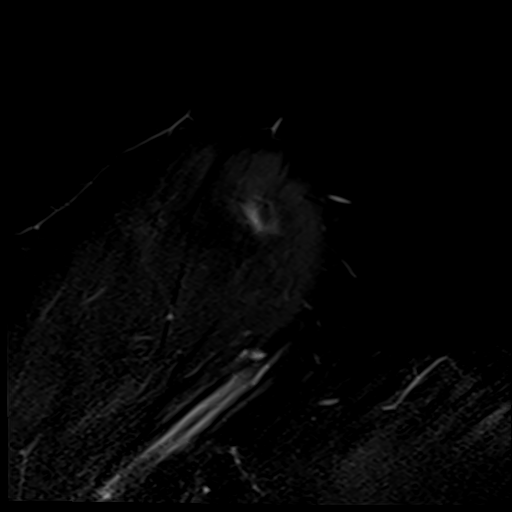

[19 of 40 positions shown; findings below may reference images not displayed]

FINDINGS: Rotator cuff: Severe tendinosis of the supraspinatus tendon with a
high-grade partial-thickness articular surface tear anteriorly and a
small full-thickness component. Severe tendinosis of the
infraspinatus tendon. Teres minor tendon is intact. Mild tendinosis
of the subscapularis tendon.

Muscles: Mild atrophy of the supraspinatus, infraspinatus and
subscapularis muscles.

Biceps Long Head: Complete tear of the proximal long head of the
biceps tendon.

Acromioclavicular Joint: Moderate arthropathy of the
acromioclavicular joint. Trace subacromial/subdeltoid bursal fluid.

Glenohumeral Joint: Large joint effusion with synovitis. Extensive
full-thickness cartilage loss of the glenohumeral joint with
subchondral reactive marrow edema and marginal osteophytes.

Labrum: Diffuse labral degeneration.

Bones: No fracture or dislocation. No aggressive osseous lesion.
Subchondral marrow edema in the posterior humeral head extends into
the humeral neck.

Other: No fluid collection or hematoma.
IMPRESSION: 1. Severe tendinosis of the supraspinatus tendon with a high-grade
partial-thickness articular surface tear anteriorly and a small
full-thickness component.
2. Severe tendinosis of the infraspinatus tendon.
3. Mild tendinosis of the subscapularis tendon.
4. Complete tear of the proximal long head of the biceps tendon.
5. Severe osteoarthritis of the right glenohumeral joint. Large
joint effusion with synovitis.

## 2023-10-17 ENCOUNTER — Other Ambulatory Visit (HOSPITAL_BASED_OUTPATIENT_CLINIC_OR_DEPARTMENT_OTHER): Payer: Self-pay | Admitting: Family

## 2023-10-18 ENCOUNTER — Ambulatory Visit

## 2023-10-18 VITALS — Ht 59.0 in | Wt 193.0 lb

## 2023-10-18 DIAGNOSIS — Z1211 Encounter for screening for malignant neoplasm of colon: Secondary | ICD-10-CM

## 2023-10-18 MED ORDER — NA SULFATE-K SULFATE-MG SULF 17.5-3.13-1.6 GM/177ML PO SOLN
1.0000 | Freq: Once | ORAL | 0 refills | Status: AC
Start: 2023-10-18 — End: 2023-10-18

## 2023-10-18 NOTE — Progress Notes (Signed)
 No egg or soy allergy known to patient  No issues known to pt with past sedation with any surgeries or procedures Patient denies ever being told they had issues or difficulty with intubation  No FH of Malignant Hyperthermia Pt is not on diet pills Pt is not on  home 02  Pt is not on blood thinners  Pt denies issues with constipation  No A fib or A flutter Have any cardiac testing pending-- no  LOA: independent  Prep: suprep   Patient's chart reviewed by Cathlyn Parsons CNRA prior to previsit and patient appropriate for the [facility].  Previsit completed and red dot placed by patient's name on their procedure day (on provider's schedule).     PV completed with patient. Prep instructions sent to home address.

## 2023-10-30 ENCOUNTER — Emergency Department (HOSPITAL_COMMUNITY)

## 2023-10-30 ENCOUNTER — Observation Stay (HOSPITAL_COMMUNITY)
Admission: EM | Admit: 2023-10-30 | Discharge: 2023-11-03 | Disposition: A | Discharge status: Home / Self Care | Attending: Internal Medicine | Admitting: Internal Medicine

## 2023-10-30 ENCOUNTER — Encounter (HOSPITAL_COMMUNITY): Payer: Self-pay

## 2023-10-30 ENCOUNTER — Other Ambulatory Visit: Payer: Self-pay

## 2023-10-30 DIAGNOSIS — M069 Rheumatoid arthritis, unspecified: Secondary | ICD-10-CM | POA: Diagnosis present

## 2023-10-30 DIAGNOSIS — R112 Nausea with vomiting, unspecified: Secondary | ICD-10-CM | POA: Diagnosis present

## 2023-10-30 DIAGNOSIS — Z87891 Personal history of nicotine dependence: Secondary | ICD-10-CM | POA: Diagnosis not present

## 2023-10-30 DIAGNOSIS — Z79899 Other long term (current) drug therapy: Secondary | ICD-10-CM | POA: Diagnosis not present

## 2023-10-30 DIAGNOSIS — R197 Diarrhea, unspecified: Secondary | ICD-10-CM | POA: Insufficient documentation

## 2023-10-30 DIAGNOSIS — F1090 Alcohol use, unspecified, uncomplicated: Secondary | ICD-10-CM | POA: Diagnosis not present

## 2023-10-30 DIAGNOSIS — R2689 Other abnormalities of gait and mobility: Secondary | ICD-10-CM | POA: Diagnosis not present

## 2023-10-30 DIAGNOSIS — R29898 Other symptoms and signs involving the musculoskeletal system: Principal | ICD-10-CM

## 2023-10-30 DIAGNOSIS — R531 Weakness: Secondary | ICD-10-CM | POA: Diagnosis present

## 2023-10-30 DIAGNOSIS — Z7901 Long term (current) use of anticoagulants: Secondary | ICD-10-CM | POA: Diagnosis not present

## 2023-10-30 DIAGNOSIS — I10 Essential (primary) hypertension: Secondary | ICD-10-CM | POA: Diagnosis present

## 2023-10-30 DIAGNOSIS — E876 Hypokalemia: Secondary | ICD-10-CM | POA: Diagnosis present

## 2023-10-30 DIAGNOSIS — N289 Disorder of kidney and ureter, unspecified: Secondary | ICD-10-CM | POA: Diagnosis not present

## 2023-10-30 DIAGNOSIS — E785 Hyperlipidemia, unspecified: Secondary | ICD-10-CM | POA: Diagnosis present

## 2023-10-30 DIAGNOSIS — N179 Acute kidney failure, unspecified: Secondary | ICD-10-CM | POA: Diagnosis not present

## 2023-10-30 LAB — CBC WITH DIFFERENTIAL/PLATELET
Abs Immature Granulocytes: 0.01 10*3/uL (ref 0.00–0.07)
Basophils Absolute: 0 10*3/uL (ref 0.0–0.1)
Basophils Relative: 1 %
Eosinophils Absolute: 0.1 10*3/uL (ref 0.0–0.5)
Eosinophils Relative: 1 %
HCT: 37.7 % (ref 36.0–46.0)
Hemoglobin: 13.8 g/dL (ref 12.0–15.0)
Immature Granulocytes: 0 %
Lymphocytes Relative: 33 %
Lymphs Abs: 2 10*3/uL (ref 0.7–4.0)
MCH: 31.9 pg (ref 26.0–34.0)
MCHC: 36.6 g/dL — ABNORMAL HIGH (ref 30.0–36.0)
MCV: 87.1 fL (ref 80.0–100.0)
Monocytes Absolute: 0.4 10*3/uL (ref 0.1–1.0)
Monocytes Relative: 7 %
Neutro Abs: 3.6 10*3/uL (ref 1.7–7.7)
Neutrophils Relative %: 58 %
Platelets: 278 10*3/uL (ref 150–400)
RBC: 4.33 MIL/uL (ref 3.87–5.11)
RDW: 13 % (ref 11.5–15.5)
WBC: 6.1 10*3/uL (ref 4.0–10.5)
nRBC: 0 % (ref 0.0–0.2)

## 2023-10-30 LAB — RESP PANEL BY RT-PCR (RSV, FLU A&B, COVID)  RVPGX2
Influenza A by PCR: NEGATIVE
Influenza B by PCR: NEGATIVE
Resp Syncytial Virus by PCR: NEGATIVE
SARS Coronavirus 2 by RT PCR: NEGATIVE

## 2023-10-30 LAB — BASIC METABOLIC PANEL WITH GFR
Anion gap: 11 (ref 5–15)
BUN: 8 mg/dL (ref 8–23)
CO2: 24 mmol/L (ref 22–32)
Calcium: 9.2 mg/dL (ref 8.9–10.3)
Chloride: 103 mmol/L (ref 98–111)
Creatinine, Ser: 1.14 mg/dL — ABNORMAL HIGH (ref 0.44–1.00)
GFR, Estimated: 50 mL/min — ABNORMAL LOW (ref >60)
Glucose, Bld: 118 mg/dL — ABNORMAL HIGH (ref 70–99)
Potassium: 3 mmol/L — ABNORMAL LOW (ref 3.5–5.1)
Sodium: 138 mmol/L (ref 135–145)

## 2023-10-30 LAB — MAGNESIUM: Magnesium: 1.9 mg/dL (ref 1.7–2.4)

## 2023-10-30 LAB — TROPONIN I (HIGH SENSITIVITY)
Troponin I (High Sensitivity): 6 ng/L (ref <18)
Troponin I (High Sensitivity): 6 ng/L (ref <18)

## 2023-10-30 NOTE — ED Triage Notes (Signed)
 Pt coming from home, left sided deficits from previous stroke. Increased trouble walking for 2 months. Psych hx. C/O bilateral leg pain. VSS.

## 2023-10-30 NOTE — ED Provider Triage Note (Signed)
 Emergency Medicine Provider Triage Evaluation Note  [name] , a 78 y.o. female  was evaluated in triage.  Pt complains of chest pain that radiates into the left arm as well as weakness which has been progressively worsening over the past couple months.  She also would like CT of her head due to trauma she suffered within the past year but had never evaluated.  Review of Systems  Positive: As above Negative: As above  Physical Exam  BP 123/89 (BP Location: Left Arm)   Pulse 91   Temp 98.2 F (36.8 C)   Resp 18   Ht 5' 1 (1.549 m)   Wt 86.6 kg   SpO2 97%   BMI 36.09 kg/m  Gen:   Awake, no distress   Resp:  Normal effort  MSK:   Moves extremities without difficulty  Other:    Medical Decision Making  Medically screening exam initiated at 6:27 PM.  Appropriate orders placed.  LAYNE LEBON was informed that the remainder of the evaluation will be completed by another provider, this initial triage assessment does not replace that evaluation, and the importance of remaining in the ED until their evaluation is complete.    Hildegard Loge, PA-C 10/30/23 1827

## 2023-10-31 ENCOUNTER — Emergency Department (HOSPITAL_COMMUNITY)

## 2023-10-31 ENCOUNTER — Observation Stay (HOSPITAL_COMMUNITY)

## 2023-10-31 DIAGNOSIS — N289 Disorder of kidney and ureter, unspecified: Secondary | ICD-10-CM | POA: Diagnosis not present

## 2023-10-31 DIAGNOSIS — R531 Weakness: Secondary | ICD-10-CM | POA: Diagnosis not present

## 2023-10-31 DIAGNOSIS — R112 Nausea with vomiting, unspecified: Secondary | ICD-10-CM | POA: Diagnosis not present

## 2023-10-31 DIAGNOSIS — I10 Essential (primary) hypertension: Secondary | ICD-10-CM

## 2023-10-31 DIAGNOSIS — E785 Hyperlipidemia, unspecified: Secondary | ICD-10-CM

## 2023-10-31 DIAGNOSIS — R197 Diarrhea, unspecified: Secondary | ICD-10-CM

## 2023-10-31 DIAGNOSIS — E876 Hypokalemia: Secondary | ICD-10-CM

## 2023-10-31 DIAGNOSIS — M069 Rheumatoid arthritis, unspecified: Secondary | ICD-10-CM

## 2023-10-31 LAB — SEDIMENTATION RATE: Sed Rate: 20 mm/h (ref 0–22)

## 2023-10-31 LAB — URINALYSIS, ROUTINE W REFLEX MICROSCOPIC
Bilirubin Urine: NEGATIVE
Glucose, UA: NEGATIVE mg/dL
Hgb urine dipstick: NEGATIVE
Ketones, ur: NEGATIVE mg/dL
Leukocytes,Ua: NEGATIVE
Nitrite: NEGATIVE
Protein, ur: NEGATIVE mg/dL
Specific Gravity, Urine: 1.013 (ref 1.005–1.030)
pH: 5 (ref 5.0–8.0)

## 2023-10-31 LAB — CK: Total CK: 116 U/L (ref 38–234)

## 2023-10-31 LAB — TSH: TSH: 1.247 u[IU]/mL (ref 0.350–4.500)

## 2023-10-31 LAB — C-REACTIVE PROTEIN: CRP: 0.6 mg/dL (ref <1.0)

## 2023-10-31 MED ORDER — ACETAMINOPHEN 325 MG PO TABS
650.0000 mg | ORAL_TABLET | Freq: Four times a day (QID) | ORAL | Status: DC | PRN
  Administered 2023-11-01 (×2): 650 mg via ORAL
  Filled 2023-10-31 (×2): qty 2

## 2023-10-31 MED ORDER — ALBUTEROL SULFATE (2.5 MG/3ML) 0.083% IN NEBU: 2.5000 mg | INHALATION_SOLUTION | Freq: Four times a day (QID) | RESPIRATORY_TRACT | Status: DC | PRN

## 2023-10-31 MED ORDER — ONDANSETRON HCL 4 MG PO TABS
4.0000 mg | ORAL_TABLET | Freq: Four times a day (QID) | ORAL | Status: DC | PRN
Start: 2023-10-31 — End: 2023-11-03

## 2023-10-31 MED ORDER — ACETAMINOPHEN 650 MG RE SUPP: 650.0000 mg | Freq: Four times a day (QID) | RECTAL | Status: DC | PRN

## 2023-10-31 MED ORDER — POTASSIUM CHLORIDE CRYS ER 20 MEQ PO TBCR
40.0000 meq | EXTENDED_RELEASE_TABLET | Freq: Once | ORAL | Status: AC
  Administered 2023-10-31: 40 meq via ORAL
  Filled 2023-10-31: qty 2

## 2023-10-31 MED ORDER — ENOXAPARIN SODIUM 40 MG/0.4ML IJ SOSY
40.0000 mg | PREFILLED_SYRINGE | INTRAMUSCULAR | Status: DC
  Administered 2023-10-31 – 2023-11-02 (×3): 40 mg via SUBCUTANEOUS
  Filled 2023-10-31 (×3): qty 0.4

## 2023-10-31 MED ORDER — ONDANSETRON HCL 4 MG/2ML IJ SOLN: 4.0000 mg | Freq: Four times a day (QID) | INTRAMUSCULAR | Status: DC | PRN

## 2023-10-31 MED ORDER — SODIUM CHLORIDE 0.9 % IV SOLN: INTRAVENOUS | Status: AC

## 2023-10-31 MED ORDER — SODIUM CHLORIDE 0.9% FLUSH
3.0000 mL | Freq: Two times a day (BID) | INTRAVENOUS | Status: DC
  Administered 2023-10-31 – 2023-11-02 (×6): 3 mL via INTRAVENOUS

## 2023-10-31 NOTE — Plan of Care (Signed)
   Problem: Education: Goal: Knowledge of General Education information will improve Description Including pain rating scale, medication(s)/side effects and non-pharmacologic comfort measures Outcome: Progressing

## 2023-10-31 NOTE — ED Notes (Signed)
 Nt called CCMD@7 :44am

## 2023-10-31 NOTE — ED Notes (Signed)
 Pt to MRI

## 2023-10-31 NOTE — H&P (Signed)
 History and Physical    Patient: [name] FMW:[id number] DOB: [birth year] DOA: 10/30/2023 DOS: the patient was seen and examined on 10/31/2023 PCP: Rosalea Rosina SAILOR, PA  Patient coming from: Home  Chief Complaint:  Chief Complaint  Patient presents with   Weakness   HPI: [name] is a 78 y.o. female with medical history significant of hypertension, hyperlipidemia, CAD, CVA with residual left-sided deficits, asthma, diabetes mellitus type 2, rheumatoid arthritis, depression, GERD, and obesity presents with complaints of progressive weakness.  She has been experiencing stiffness and inability to move her legs for the past two months, with significant worsening over the last two weeks. She uses a walker or cane for mobility and has not had any recent falls, although she did fall about six months ago.  She experiences weakness in both her arms and legs. She has a history of rheumatoid arthritis and is currently managing pain with Tylenol . She is not on any steroids.  She feels cold most of the time and experiences frequent nausea, vomiting, and diarrhea, particularly in the mornings. She describes feeling 'like I'm pregnant' and notes that eating often leads to vomiting or diarrhea within 20-30 minutes.  She lives alone and has no close family nearby to assist her, mentioning family conflicts over financial matters.   In the emergency department patient was noted to be afebrile with respirations 16-24, blood pressures elevated up to 155/104, and other vital signs relatively maintained.  Labs significant for potassium 3, BUN 8, creatinine 1.14, and CK 116.  CT scan of the head did not note any acute abnormality.  Chest x-ray noted cardiomegaly without active disease.  Urinalysis showed no acute abnormality.SABRA  MRI of the thoracic spine noted no acute abnormality with dextroscoliosis with multilevel thoracic spondylolysis without significant spinal stenosis.  MRI of the lumbar spine  noted noted no acute abnormality or evidence of cord compression or high-grade spinal stenosis but did note left foraminal to extraforaminal disc protrusion at L5-L3 potentially affecting the exiting left L2 nerve root and mild to moderate stenosis stenosis L3-L5.  Patient had been given potassium chloride 40 mEq p.o.  TRH consulted to admit.  Review of Systems: As mentioned in the history of present illness. All other systems reviewed and are negative. Past Medical History:  Diagnosis Date   Allergy    Arthritis    Asthma    CAD (coronary artery disease)    Chronic headaches    Depression    Diabetes mellitus, type II (HCC)    Diverticulosis    Endometriosis    GERD (gastroesophageal reflux disease)    Heart attack (HCC)    High cholesterol    Hypertension    Mallory-Weiss tear 2003   Mood swings    Rheumatoid arthritis (HCC)    Vitamin D deficiency    Past Surgical History:  Procedure Laterality Date   ABDOMINAL HYSTERECTOMY     EXPLORATORY LAPAROTOMY     I & D EXTREMITY Left 08/04/2017   Procedure: IRRIGATION AND DEBRIDEMENT HAND;  Surgeon: Murrell Drivers, MD;  Location: [facility] OR;  Service: Orthopedics;  Laterality: Left;   LEFT HEART CATHETERIZATION WITH CORONARY ANGIOGRAM N/A 04/10/2013   Procedure: LEFT HEART CATHETERIZATION WITH CORONARY ANGIOGRAM;  Surgeon: Salena GORMAN Negri, MD;  Location: [facility] CATH LAB;  Service: Cardiovascular;  Laterality: N/A;   Social History:  reports that she has quit smoking. Her smoking use included cigarettes. She has never used smokeless tobacco. She reports current alcohol use. She reports  that she does not use drugs.  No Known Allergies  Family History  Problem Relation Age of Onset   Cancer Mother    Heart disease Mother    Cancer Father    Heart disease Father    Colon cancer Neg Hx    Rectal cancer Neg Hx    Stomach cancer Neg Hx     Prior to Admission medications   Medication Sig Start Date End Date Taking? Authorizing Provider   albuterol  (VENTOLIN  HFA) 108 (90 Base) MCG/ACT inhaler Inhale 2 puffs into the lungs every 6 (six) hours as needed for wheezing or shortness of breath. 03/09/23   Lorin Norris, MD  amLODipine  (NORVASC ) 5 MG tablet Take 1.5 tablets (7.5 mg total) by mouth daily. 09/11/23   Wyn Jackee VEAR Mickey., NP  aspirin  EC 81 MG tablet Take 1 tablet (81 mg total) by mouth daily. Swallow whole. 07/14/22   Walker, Caitlin S, NP  benztropine (COGENTIN) 0.5 MG tablet Take 0.5 mg by mouth daily. 10/12/23   [provider]  BREYNA  160-4.5 MCG/ACT inhaler Inhale 2 puffs into the lungs. 10/30/23   [provider]  Budeson-Glycopyrrol-Formoterol  (BREZTRI  AEROSPHERE) 160-9-4.8 MCG/ACT AERO Inhale 2 puffs into the lungs in the morning and at bedtime. 03/09/23   Lorin Norris, MD  buPROPion (WELLBUTRIN XL) 150 MG 24 hr tablet Take 150 mg by mouth every morning. 03/22/22   [provider]  esomeprazole  (NEXIUM ) 40 MG capsule Take 1 capsule (40 mg total) by mouth daily at 12 noon. 07/31/22   Walker, Caitlin S, NP  fluticasone  (FLONASE ) 50 MCG/ACT nasal spray Place 2 sprays into both nostrils daily. 01/12/23   Lomasney, Evelyn, MD  furosemide (LASIX) 40 MG tablet Take 1 tablet by mouth daily.    [provider]  gabapentin (NEURONTIN) 400 MG capsule Take 400 mg by mouth 3 (three) times daily. 05/07/22   [provider]  meclizine  (ANTIVERT ) 12.5 MG tablet Take 1 tablet (12.5 mg total) by mouth 3 (three) times daily as needed for dizziness. 11/12/20   Laurice Maude BROCKS, MD  metoprolol  succinate (TOPROL -XL) 25 MG 24 hr tablet Take 1 tablet (25 mg total) by mouth daily. 09/11/23   Wyn Jackee VEAR Mickey., NP  metoprolol  succinate (TOPROL -XL) 50 MG 24 hr tablet Take 50 mg by mouth daily. 10/30/23   [provider]  montelukast  (SINGULAIR ) 10 MG tablet Take 1 tablet (10 mg total) by mouth at bedtime. 01/12/23   Lorin Norris, MD  nitroGLYCERIN  (NITROSTAT ) 0.3 MG SL tablet Place 0.3 mg under the  tongue every 5 (five) minutes as needed for chest pain. 12/17/19   [provider]  omeprazole  (PRILOSEC) 20 MG capsule Take 20 mg by mouth daily. 10/30/23   [provider]  predniSONE  (DELTASONE ) 10 MG tablet Prednisone  10mg  : Take 2 tablets twice a day for 3 more days, Then take 2 tablets once a day for 1 day., then take 1 tablet once a day for 1 day. Patient not taking: Reported on 10/18/2023 03/09/23   Lorin Norris, MD  rosuvastatin  (CRESTOR ) 10 MG tablet Take 1 tablet (10 mg total) by mouth daily. 09/11/23 09/05/24  Wyn Jackee VEAR Mickey., NP  Vitamin D, Ergocalciferol, (DRISDOL) 1.25 MG (50000 UNIT) CAPS capsule Take 50,000 Units by mouth once a week. 05/07/22   [provider]    Physical Exam: Vitals:   10/31/23 0615 10/31/23 0630 10/31/23 0645 10/31/23 0659  BP:  126/72    Pulse: 74 79 70  Resp:  16    Temp:    98.4 F (36.9 C)  TempSrc:    Oral  SpO2: 99% 100% 98%   Weight:      Height:        Constitutional: Elderly female who appears to be in no acute distress Eyes: PERRL, lids and conjunctivae normal ENMT: Mucous membranes are moist. Posterior pharynx clear of any exudate or lesions.Normal dentition.  Neck: normal, supple  Respiratory: clear to auscultation bilaterally, no wheezing, no crackles. Normal respiratory effort. No accessory muscle use.  Cardiovascular: Regular rate and rhythm, no murmurs / rubs / gallops.  Lower extremity edema. 2+ pedal pulses.    Abdomen: no tenderness, no masses palpated. No hepatosplenomegaly. Bowel sounds positive.  Musculoskeletal: no clubbing / cyanosis. No joint deformity upper and lower extremities. Good ROM, no contractures. Normal muscle tone.  Skin: no rashes, lesions, ulcers. No induration Neurologic: CN 2-12 grossly intact.  Strength 3/5 in the bilateral lower extremities Psychiatric: Normal judgment and insight. Alert and oriented x 3. Normal mood.   Data Reviewed:  EKG revealed normal sinus rhythm at 86  bpm.  Reviewed labs, imaging, and pertinent records as documented. Assessment and Plan:  Generalized weakness Patient presents with complaints of what sounds like generalized weakness and stiffness.   CT scan of the head did not reveal any acute abnormality.  Has a history of rheumatoid arthritis, but not on any medications for treatment.  Question of symptoms related to dehydration with reports of nausea and vomiting. - Admit to a medical telemetry bed - Neurochecks - Add on ESR, CRP, sedimentation rate, and TSH - PT/OT to evaluate treat - Consider need formal consult to neurology if symptoms do not appear to improve.  Nausea, vomiting, and diarrhea Patient reports that she has been having persistent nausea, vomiting, and diarrhea anytime that she eats.  Unclear cause of symptoms noted at this time.. - Monitor I&Os - Check abdominal x-ray - Antiemetics as needed  Hypokalemia Acute.  Initial potassium noted to be 3.  Likely related to reports of nausea, vomiting, and diarrhea.  Patient was given potassium chloride 40 mEq p.o. - Continue to monitor and replace as needed  Renal insufficiency Acute.  Creatinine noted to be mildly elevated at 1.14 with BUN 8.  Baseline creatinine previously noted to be around 0.7-0.9.  Urinalysis did not show any acute abnormality.  Patient is on furosemide. - Normal saline IV fluids at 100 mL/h for 5 hours. - Held furosemide - Recheck kidney function in a.m.  Essential hypertension Blood pressures initially elevated up to 155/104 - Continue metoprolol  and amlodipine . - Held furosemide due to concern for patient being dehydrated possibly.  Rheumatoid arthritis  Patient is not on any medications for treatment.  Hyperlipidemia - Continue Crestor   DVT prophylaxis: Lovenox Advance Care Planning:   Code Status: Full Code   Consults: None  Family Communication: No family requested to be updated at this time.  Severity of Illness: The appropriate  patient status for this patient is OBSERVATION. Observation status is judged to be reasonable and necessary in order to provide the required intensity of service to ensure the patient's safety. The patient's presenting symptoms, physical exam findings, and initial radiographic and laboratory data in the context of their medical condition is felt to place them at decreased risk for further clinical deterioration. Furthermore, it is anticipated that the patient will be medically stable for discharge from the hospital within 2 midnights of admission.   Author: Maximino LABOR  Claudene, MD 10/31/2023 7:21 AM  For on call review [email or web address].

## 2023-10-31 NOTE — ED Notes (Signed)
 Pt to xray

## 2023-10-31 NOTE — ED Notes (Signed)
 Pt unable to ambulate, able to stand with max assist, but unable to pivot.

## 2023-10-31 NOTE — ED Provider Notes (Signed)
 [place] EMERGENCY DEPARTMENT AT [facility] Provider Note   CSN: [id number] Arrival date & time: 10/30/23  1754     Patient presents with: Weakness   [name] is a 78 y.o. female.   The history is provided by the patient.  Weakness She has history of hypertension, diabetes, hyperlipidemia, coronary artery disease, GERD, asthma, rheumatoid arthritis and comes in because of progressive weakness over the last 6-8 months.  She states for the last 4 days, she cannot get herself up from the floor if she falls, and cannot even maintain a sitting position because of weakness.  She does have some tingling in her feet which is unchanged from baseline.  She has chronic diarrhea which is unchanged.  There has been no incontinence and no urinary incontinence or retention.  She also had complaints of chest pain which have resolved.  She had requested CT of her head because she had head trauma in the past year and was just worried about it but has no specific symptoms related to that.  She denies fever, chills, sweats.   Prior to Admission medications   Medication Sig Start Date End Date Taking? Authorizing Provider  albuterol  (VENTOLIN  HFA) 108 (90 Base) MCG/ACT inhaler Inhale 2 puffs into the lungs every 6 (six) hours as needed for wheezing or shortness of breath. 03/09/23   Lorin Norris, MD  amLODipine  (NORVASC ) 5 MG tablet Take 1.5 tablets (7.5 mg total) by mouth daily. 09/11/23   Wyn Jackee VEAR Mickey., NP  aspirin  EC 81 MG tablet Take 1 tablet (81 mg total) by mouth daily. Swallow whole. 07/14/22   Walker, Caitlin S, NP  benztropine (COGENTIN) 0.5 MG tablet Take 0.5 mg by mouth daily. 10/12/23   [provider]  Budeson-Glycopyrrol-Formoterol  (BREZTRI  AEROSPHERE) 160-9-4.8 MCG/ACT AERO Inhale 2 puffs into the lungs in the morning and at bedtime. 03/09/23   Lorin Norris, MD  buPROPion (WELLBUTRIN XL) 150 MG 24 hr tablet Take 150 mg by mouth every morning. 03/22/22   [provider]  esomeprazole  (NEXIUM ) 40 MG capsule Take 1 capsule (40 mg total) by mouth daily at 12 noon. 07/31/22   Walker, Caitlin S, NP  fluticasone  (FLONASE ) 50 MCG/ACT nasal spray Place 2 sprays into both nostrils daily. 01/12/23   Lomasney, Evelyn, MD  furosemide (LASIX) 40 MG tablet Take 1 tablet by mouth daily.    [provider]  gabapentin (NEURONTIN) 400 MG capsule Take 400 mg by mouth 3 (three) times daily. 05/07/22   [provider]  meclizine  (ANTIVERT ) 12.5 MG tablet Take 1 tablet (12.5 mg total) by mouth 3 (three) times daily as needed for dizziness. 11/12/20   Laurice Maude BROCKS, MD  metoprolol  succinate (TOPROL -XL) 25 MG 24 hr tablet Take 1 tablet (25 mg total) by mouth daily. 09/11/23   Wyn Jackee VEAR Mickey., NP  montelukast  (SINGULAIR ) 10 MG tablet Take 1 tablet (10 mg total) by mouth at bedtime. 01/12/23   Lorin Norris, MD  nitroGLYCERIN  (NITROSTAT ) 0.3 MG SL tablet Place 0.3 mg under the tongue every 5 (five) minutes as needed for chest pain. 12/17/19   [provider]  predniSONE  (DELTASONE ) 10 MG tablet Prednisone  10mg  : Take 2 tablets twice a day for 3 more days, Then take 2 tablets once a day for 1 day., then take 1 tablet once a day for 1 day. Patient not taking: Reported on 10/18/2023 03/09/23   Lorin Norris, MD  rosuvastatin  (CRESTOR ) 10 MG tablet Take 1 tablet (10  mg total) by mouth daily. 09/11/23 09/05/24  Wyn Jackee VEAR Mickey., NP  Vitamin D, Ergocalciferol, (DRISDOL) 1.25 MG (50000 UNIT) CAPS capsule Take 50,000 Units by mouth once a week. 05/07/22   [provider]    Allergies: Patient has no known allergies.    Review of Systems  Neurological:  Positive for weakness.  All other systems reviewed and are negative.   Updated Vital Signs BP (!) 155/104   Pulse 100   Temp 98.6 F (37 C) (Oral)   Resp (!) 22   Ht 5' 1 (1.549 m)   Wt 86.6 kg   SpO2 100%   BMI 36.09 kg/m   Physical Exam Vitals and nursing note reviewed.   78  year old female, resting comfortably and in no acute distress. Vital signs are significant for elevated blood pressure and mildly elevated respiratory rate. Oxygen saturation is 100%, which is normal. Head is normocephalic and atraumatic. PERRLA, EOMI. . Neck is nontender and supple. Back is nontender. Lungs are clear without rales, wheezes, or rhonchi. Chest is nontender. Heart has regular rate and rhythm without murmur. Abdomen is soft, flat, nontender. Extremities have 1+ edema.  Capillary refill is prompt, dorsalis pedis pulses are present bilaterally. Skin is warm and dry without rash. Neurologic: Awake and alert and oriented, cranial nerves are intact.  Strength is 5/5 in her arms, also in both feet to plantarflexion and dorsiflexion.  Mild to moderate symmetric weakness is present in hip flexion, knee flexion, knee extension with hip flexion strength 3/5 and knee flexion and extension 4/5.  There is mild decreased pinprick sensation in both lower legs and the abdomen with normal sensation obtained at the mid chest.  (all labs ordered are listed, but only abnormal results are displayed) Labs Reviewed  CBC WITH DIFFERENTIAL/PLATELET - Abnormal; Notable for the following components:      Result Value   MCHC 36.6 (*)    All other components within normal limits  BASIC METABOLIC PANEL WITH GFR - Abnormal; Notable for the following components:   Potassium 3.0 (*)    Glucose, Bld 118 (*)    Creatinine, Ser 1.14 (*)    GFR, Estimated 50 (*)    All other components within normal limits  RESP PANEL BY RT-PCR (RSV, FLU A&B, COVID)  RVPGX2  MAGNESIUM   URINALYSIS, ROUTINE W REFLEX MICROSCOPIC  CK  TROPONIN I (HIGH SENSITIVITY)  TROPONIN I (HIGH SENSITIVITY)    EKG: EKG Interpretation Date/Time:  Tuesday October 30 2023 18:45:49 EDT Ventricular Rate:  86 PR Interval:  140 QRS Duration:  66 QT Interval:  372 QTC Calculation: 445 R Axis:   32  Text Interpretation: Normal sinus rhythm  Possible Inferior infarct , age undetermined Abnormal ECG When compared with ECG of 13-Nov-2022 10:38, No significant change was found Confirmed by Raford Lenis ([id number]) on 10/31/2023 1:20:38 AM  Radiology: DG Chest 1 View Result Date: 10/30/2023 CLINICAL DATA:  Chest pain EXAM: CHEST  1 VIEW COMPARISON:  Chest x-ray 07/13/2021 FINDINGS: The heart is enlarged. There is no focal lung infiltrate, pleural effusion or pneumothorax. Aorta is ectatic. No acute fractures are seen. There are degenerative changes of the right shoulder. IMPRESSION: Cardiomegaly. No active disease. Electronically Signed   By: Greig Pique M.D.   On: 10/30/2023 19:56   CT Head Wo Contrast Result Date: 10/30/2023 CLINICAL DATA:  Poly trauma, history of prior stroke with left-sided deficits, difficulty walking. EXAM: CT HEAD WITHOUT CONTRAST TECHNIQUE: Contiguous axial images were obtained from the  base of the skull through the vertex without intravenous contrast. RADIATION DOSE REDUCTION: This exam was performed according to the departmental dose-optimization program which includes automated exposure control, adjustment of the mA and/or kV according to patient size and/or use of iterative reconstruction technique. COMPARISON:  MRI head 11/15/2020. FINDINGS: Brain: No acute intracranial hemorrhage. No CT evidence of acute infarct. No edema, mass effect, or midline shift. The basilar cisterns are patent. Ventricles: The ventricles are normal. Vascular: No hyperdense vessel or unexpected calcification. Skull: No acute or aggressive finding. Orbits: Orbits are symmetric. Sinuses: The visualized paranasal sinuses are clear. Other: Trace fluid in the left mastoid air cells. IMPRESSION: No CT evidence of acute intracranial abnormality. Electronically Signed   By: Donnice Mania M.D.   On: 10/30/2023 19:12     Procedures  Cardiac monitor shows normal sinus rhythm, per my interpretation. Medications Ordered in the ED  potassium chloride SA  (KLOR-CON M) CR tablet 40 mEq (has no administration in time range)                HEART Score: 4                    Medical Decision Making Amount and/or Complexity of Data Reviewed Labs: ordered. Radiology: ordered.  Risk Prescription drug management.   Progressive weakness with decreased sensation concerning for cauda equina versus spinal cord compression.  Remote head injury without evidence of sequelae.  Atypical chest pain.  I have reviewed her laboratory tests, and my interpretation is normal CBC, hypokalemia renal insufficiency with creatinine having increased slightly compared with 07/03/2022, elevated random glucose level which will need to be followed as an outpatient, normal magnesium , normal troponin x 2 negative PCR for influenza and COVID-19 and RSV.  I do note that she is taking a statin, I have ordered CK to evaluate for possible rhabdomyolysis.  I have reviewed her electrocardiogram, my interpretation is possible old inferior wall MI with no acute ST or T changes and unchanged from prior.  Chest x-ray shows mild cardiomegaly but no acute cardiopulmonary process, CT of head shows no acute intracranial abnormality.  Have independently viewed all the images, and agree with the radiologist's interpretation.  I have ordered MRI scans of thoracic and lumbar spine.  I have ordered oral potassium.  Heart score is 4 which puts her at moderate risk for major adverse cardiac events in the next 6 weeks.  MRI scans do not show anything which would explain her difficulty with walking.  Thoracic spine had no acute abnormality with normal appearance of thoracic spinal cord, lumbar spine had no acute abnormality and no evidence of spinal stenosis although there was a left foraminal disc protrusion at L2-3 and degenerative changes at L3-4 and L4-5 with mild spinal stenosis.  I have independently viewed the images, and agree with the radiologist's interpretation.  Attempt was made to ambulate the  patient and she is completely unable to support herself.  She does live by herself and is not able to go home.  Cause for weakness is unclear.  I feel she needs to be admitted for further evaluation and possible neurology consultation.  I discussed case with Dr. Claudene of [facility] hospitalist, who agrees to admit the patient.  Final diagnoses:  Weakness of both lower extremities  Hypokalemia    ED Discharge Orders     None          Raford Lenis, MD 10/31/23 [phone]

## 2023-11-01 DIAGNOSIS — R112 Nausea with vomiting, unspecified: Secondary | ICD-10-CM | POA: Diagnosis not present

## 2023-11-01 DIAGNOSIS — E876 Hypokalemia: Secondary | ICD-10-CM | POA: Diagnosis not present

## 2023-11-01 DIAGNOSIS — N289 Disorder of kidney and ureter, unspecified: Secondary | ICD-10-CM | POA: Diagnosis not present

## 2023-11-01 DIAGNOSIS — R531 Weakness: Secondary | ICD-10-CM | POA: Diagnosis not present

## 2023-11-01 LAB — CBC
HCT: 32.7 % — ABNORMAL LOW (ref 36.0–46.0)
Hemoglobin: 11.7 g/dL — ABNORMAL LOW (ref 12.0–15.0)
MCH: 31.3 pg (ref 26.0–34.0)
MCHC: 35.8 g/dL (ref 30.0–36.0)
MCV: 87.4 fL (ref 80.0–100.0)
Platelets: 231 10*3/uL (ref 150–400)
RBC: 3.74 MIL/uL — ABNORMAL LOW (ref 3.87–5.11)
RDW: 13.1 % (ref 11.5–15.5)
WBC: 5.1 10*3/uL (ref 4.0–10.5)
nRBC: 0 % (ref 0.0–0.2)

## 2023-11-01 LAB — BASIC METABOLIC PANEL WITH GFR
Anion gap: 5 (ref 5–15)
BUN: 10 mg/dL (ref 8–23)
CO2: 26 mmol/L (ref 22–32)
Calcium: 8.5 mg/dL — ABNORMAL LOW (ref 8.9–10.3)
Chloride: 108 mmol/L (ref 98–111)
Creatinine, Ser: 0.9 mg/dL (ref 0.44–1.00)
GFR, Estimated: >60 mL/min (ref >60)
Glucose, Bld: 115 mg/dL — ABNORMAL HIGH (ref 70–99)
Potassium: 3.6 mmol/L (ref 3.5–5.1)
Sodium: 139 mmol/L (ref 135–145)

## 2023-11-01 NOTE — TOC Initial Note (Signed)
 Transition of Care [facility]) - Initial/Assessment Note    Patient Details  Name: [name] MRN: [id number] Date of Birth: [birth year]  [facility]) CM/SW Contact:    Muadh Creasy A [place], LCSW Phone Number: 11/01/2023, 12:53 PM  Clinical Narrative:                  CSW met with pt at bedside, pt from home, has CNA assist 7 days/week but lives alone.  Pt stated that she was agreeable to SNF at discharge. SNF workup completed. Bed offers pending.   TOC will continue to follow.   Expected Discharge Plan: Skilled Nursing Facility Barriers to Discharge: Continued Medical Work up, English as a second language teacher, SNF Pending bed offer   Patient Goals and CMS Choice            Expected Discharge Plan and Services                                              Prior Living Arrangements/Services   Lives with:: Self          Need for Family Participation in Patient Care: Yes (Comment) Care giver support system in place?: Yes (comment) (Pt has cna 7 days/week) Current home services: Homehealth aide    Activities of Daily Living   ADL Screening (condition at time of admission) Independently performs ADLs?: Yes (appropriate for developmental age) Is the patient deaf or have difficulty hearing?: No Does the patient have difficulty seeing, even when wearing glasses/contacts?: Yes Does the patient have difficulty concentrating, remembering, or making decisions?: Yes  Permission Sought/Granted                  Emotional Assessment Appearance:: Appears stated age Attitude/Demeanor/Rapport:  (normal) Affect (typically observed): Appropriate Orientation: : Oriented to Self, Oriented to Place, Oriented to  Time, Oriented to Situation Alcohol / Substance Use: Not Applicable Psych Involvement: No (comment)  Admission diagnosis:  Hypokalemia [E87.6] Lower extremity weakness [R29.898] Weakness of both lower extremities [R29.898] Patient Active Problem List    Diagnosis Date Noted   Generalized weakness 10/31/2023   Nausea vomiting and diarrhea 10/31/2023   Hypokalemia 10/31/2023   Renal insufficiency 10/31/2023   Essential hypertension 10/31/2023   Rheumatoid arthritis (HCC) 10/31/2023   Hyperlipidemia 10/31/2023   Moderate persistent asthma with acute exacerbation 12/05/2017   Perennial allergic rhinitis 09/20/2017   MDD (major depressive disorder) 07/31/2017   Insomnia 02/23/2015   OSA (obstructive sleep apnea) 02/23/2015   Asthma 02/23/2015   Chest pain on exertion 04/10/2013   Menopausal hot flushes 01/22/2013   Frequency of urination 01/22/2013   PCP:  Rosalea Rosina SAILOR, PA Pharmacy:   [facility] [address], [place] - [address] [place] [address] Phone: [phone] Fax: [phone]  [facility] - [place], [place] - [address] [place] [address] Phone: [phone] Fax: [phone]  [facility] [address], [place] - [address] [place] [address] Phone: [phone] Fax: [phone]     Social Drivers of Health (SDOH) Social History: SDOH Screenings   Food Insecurity: No Food Insecurity (10/31/2023)  Housing: Low Risk  (10/31/2023)  Transportation Needs: No Transportation Needs (10/31/2023)  Utilities: Not At Risk (10/31/2023)  Social Connections: Socially Isolated (10/31/2023)  Tobacco Use: Medium  Risk (10/30/2023)   SDOH Interventions:     Readmission Risk Interventions     No data to display

## 2023-11-01 NOTE — NC FL2 (Signed)
   MEDICAID [id number] LEVEL OF CARE FORM     IDENTIFICATION  Patient Name: [name] Birthdate: Feb 11, 1946 Sex: female Admission Date (Current Location): 10/30/2023  [facility] and [place] Number:  Producer, television/film/video and Address:  [facility] [place], [address], [place], [place] [address]      Provider Number: [id number]  Attending Physician Name and Address:  Arlon Carliss ORN, DO  Relative Name and Phone Number:  karoline israel (Niece)  [phone]    Current Level of Care: Hospital Recommended Level of Care: Skilled Nursing Facility Prior Approval Number:    Date Approved/Denied:   PASRR Number: [id number]  Discharge Plan: SNF    Current Diagnoses: Patient Active Problem List   Diagnosis Date Noted   Generalized weakness 10/31/2023   Nausea vomiting and diarrhea 10/31/2023   Hypokalemia 10/31/2023   Renal insufficiency 10/31/2023   Essential hypertension 10/31/2023   Rheumatoid arthritis (HCC) 10/31/2023   Hyperlipidemia 10/31/2023   Moderate persistent asthma with acute exacerbation 12/05/2017   Perennial allergic rhinitis 09/20/2017   MDD (major depressive disorder) 07/31/2017   Insomnia 02/23/2015   OSA (obstructive sleep apnea) 02/23/2015   Asthma 02/23/2015   Chest pain on exertion 04/10/2013   Menopausal hot flushes 01/22/2013   Frequency of urination 01/22/2013    Orientation RESPIRATION BLADDER Height & Weight     Self, Time, Situation, Place  Normal Incontinent Weight: 191 lb (86.6 kg) Height:  5' 1 (154.9 cm)  BEHAVIORAL SYMPTOMS/MOOD NEUROLOGICAL BOWEL NUTRITION STATUS      Continent Diet (see DC summary)  AMBULATORY STATUS COMMUNICATION OF NEEDS Skin     Verbally Normal                       Personal Care Assistance Level of Assistance  Bathing, Feeding, Dressing Bathing Assistance: Limited assistance Feeding assistance: Limited assistance Dressing Assistance: Limited assistance     Functional  Limitations Info  Sight, Hearing, Speech Sight Info: Adequate Hearing Info: Adequate Speech Info: Adequate    SPECIAL CARE FACTORS FREQUENCY  PT (By licensed PT), OT (By licensed OT)     PT Frequency: 5x/week OT Frequency: 5x/week            Contractures Contractures Info: Not present    Additional Factors Info  Code Status, Allergies Code Status Info: FULL Allergies Info: NKA           Current Medications (11/01/2023):  This is the current hospital active medication list Current Facility-Administered Medications  Medication Dose Route Frequency Provider Last Rate Last Admin   acetaminophen  (TYLENOL ) tablet 650 mg  650 mg Oral Q6H PRN Smith, Rondell A, MD       Or   acetaminophen  (TYLENOL ) suppository 650 mg  650 mg Rectal Q6H PRN Claudene Reeves A, MD       albuterol  (PROVENTIL ) (2.5 MG/3ML) 0.083% nebulizer solution 2.5 mg  2.5 mg Nebulization Q6H PRN Smith, Rondell A, MD       enoxaparin (LOVENOX) injection 40 mg  40 mg Subcutaneous Q24H Smith, Rondell A, MD   40 mg at 11/01/23 0844   ondansetron  (ZOFRAN ) tablet 4 mg  4 mg Oral Q6H PRN Smith, Rondell A, MD       Or   ondansetron  (ZOFRAN ) injection 4 mg  4 mg Intravenous Q6H PRN Smith, Rondell A, MD       sodium chloride  flush (NS) 0.9 % injection 3 mL  3 mL Intravenous Q12H Claudene Reeves LABOR, MD  3 mL at 11/01/23 0844     Discharge Medications: Please see discharge summary for a list of discharge medications.  Relevant Imaging Results:  Relevant Lab Results:   Additional Information [id number]  Shela Esses A [place], LCSW

## 2023-11-01 NOTE — Hospital Course (Signed)
 78 y.o. female with medical history significant of hypertension, hyperlipidemia, CAD, CVA with residual left-sided deficits, asthma, diabetes mellitus type 2, rheumatoid arthritis, depression, GERD, and obesity presents with complaints of progressive weakness.    Assessment and Plan:   Physical debilitation muscle weakness - Patient with progressive weakness.  Stated that she was unable to get up out of bed yesterday.  CT scan not noting any acute abnormality.  MRI noting no acute abnormality in the spine with no evidence of cord compression or high-grade stenosis.  Does mention Left foraminal to extraforaminal disc protrusion at L2-3, potentially affecting the exiting left L2 nerve root.  Possibility of weakness could be exacerbated by dehydration or underlying chronic debilitation.  Will order PT/OT.   Persistent nausea and vomiting with diarrhea - Reportedly having symptoms anytime she eats.  Unclear etiology.  Reported symptoms this morning.  Antiemetics on board.  Abdominal x-ray unremarkable.   Mild hypokalemia - Replenishment initiated.  Will recheck BMP in AM.   Acute kidney injury - Creatinine 1.14 with previous baseline 0.7.  Showing improvement after IV fluid hydration.  Likely secondary to prerenal/dehydration.   Essential hypertension - Metoprolol , amlodipine  on board.  Holding Lasix for now.   RA - May be contributing to patient's weakness.  No flare at this time.

## 2023-11-01 NOTE — Evaluation (Signed)
 Physical Therapy Evaluation Patient Details Name: [name] MRN: [id number] DOB: January 30, 1946 Today's Date: 11/01/2023  History of Present Illness  78 yo female admitted 10/30/23 with progressive weakness for 2 months, unable to even sit up. PMhx; chronic diarrhea, HTN, T2DM, HLD, CAD, asthma, RA  Clinical Impression  Pt pleasant and reports weakness for 7 years with decline the last year more noticeable. Pt has CNA 7 days a week for assist and walk in home with RW. Pt with decreased ability to power up with transfers, perform gait and functional mobility. Pt with functional strength of LB but has difficulty engaging and coordinating with transfers and will benefit from acute therapy to maximize function and independence. Patient will benefit from continued inpatient follow up therapy, <3 hours/day         If plan is discharge home, recommend the following: A little help with walking and/or transfers;A little help with bathing/dressing/bathroom;Assistance with cooking/housework;Assist for transportation;Help with stairs or ramp for entrance   Can travel by private vehicle   Yes    Equipment Recommendations BSC/3in1  Recommendations for Other Services  OT consult    Functional Status Assessment Patient has had a recent decline in their functional status and/or demonstrates limited ability to make significant improvements in function in a reasonable and predictable amount of time     Precautions / Restrictions Precautions Precautions: Fall Recall of Precautions/Restrictions: Intact      Mobility  Bed Mobility Overal bed mobility: Needs Assistance Bed Mobility: Supine to Sit     Supine to sit: HOB elevated, Used rails, Supervision     General bed mobility comments: HOB 35 degrees, increased time and effort to scoot to EOB    Transfers Overall transfer level: Needs assistance   Transfers: Sit to/from Stand Sit to Stand: Min assist           General transfer  comment: min assist for anterior translation to stand from EOB and BSC, posterior bias. cues for hand placement and safety    Ambulation/Gait Ambulation/Gait assistance: Min assist Gait Distance (Feet): 15 Feet Assistive device: Rolling walker (2 wheels) Gait Pattern/deviations: Step-through pattern, Decreased stride length, Trunk flexed   Gait velocity interpretation: <1.8 ft/sec, indicate of risk for recurrent falls   General Gait Details: cues for posture and proximity to RW. x 2 trials with seated rest  Stairs            Wheelchair Mobility     Tilt Bed    Modified Rankin (Stroke Patients Only)       Balance Overall balance assessment: Needs assistance, History of Falls   Sitting balance-Leahy Scale: Fair     Standing balance support: Bilateral upper extremity supported, Reliant on assistive device for balance, During functional activity Standing balance-Leahy Scale: Poor Standing balance comment: RW in standing                             Pertinent Vitals/Pain Pain Assessment Pain Assessment: 0-10 Pain Score: 3  Pain Location: knees Pain Descriptors / Indicators: Aching Pain Intervention(s): Limited activity within patient's tolerance, Monitored during session, Repositioned    Home Living Family/patient expects to be discharged to:: Private residence Living Arrangements: Alone Available Help at Discharge: Personal care attendant Type of Home: Apartment Home Access: Stairs to enter Entrance Stairs-Rails: Right Entrance Stairs-Number of Steps: 3   Home Layout: One level Home Equipment: Agricultural consultant (2 wheels);Cane - single point;Shower seat  Prior Function Prior Level of Function : Needs assist             Mobility Comments: pt reports she walks in apartment with RW, doesn't leave without assist, 4 falls in the last year ADLs Comments: normally walks to bathroom, CNA daily for IADLs and assist with bathing      Extremity/Trunk Assessment   Upper Extremity Assessment Upper Extremity Assessment: LUE deficits/detail;Generalized weakness LUE Deficits / Details: LUE deformity pt reports as accident as infant with weakness grossly 2/5    Lower Extremity Assessment Lower Extremity Assessment: LLE deficits/detail LLE Deficits / Details: 4/5 hip flexion and knee extension    Cervical / Trunk Assessment Cervical / Trunk Assessment: Normal  Communication   Communication Communication: No apparent difficulties    Cognition Arousal: Alert Behavior During Therapy: WFL for tasks assessed/performed   PT - Cognitive impairments: No apparent impairments                         Following commands: Intact       Cueing Cueing Techniques: Verbal cues     General Comments      Exercises     Assessment/Plan    PT Assessment Patient needs continued PT services  PT Problem List Decreased strength;Decreased activity tolerance;Decreased balance;Decreased mobility;Decreased knowledge of use of DME;Decreased safety awareness       PT Treatment Interventions DME instruction;Gait training;Functional mobility training;Therapeutic activities;Therapeutic exercise;Balance training;Patient/family education    PT Goals (Current goals can be found in the Care Plan section)  Acute Rehab PT Goals Patient Stated Goal: be able to move better PT Goal Formulation: With patient Time For Goal Achievement: 11/15/23 Potential to Achieve Goals: Good    Frequency Min 2X/week     Co-evaluation               AM-PAC PT 6 Clicks Mobility  Outcome Measure Help needed turning from your back to your side while in a flat bed without using bedrails?: A Little Help needed moving from lying on your back to sitting on the side of a flat bed without using bedrails?: A Little Help needed moving to and from a bed to a chair (including a wheelchair)?: A Little Help needed standing up from a chair using  your arms (e.g., wheelchair or bedside chair)?: A Little Help needed to walk in hospital room?: A Little Help needed climbing 3-5 steps with a railing? : A Lot 6 Click Score: 17    End of Session Equipment Utilized During Treatment: Gait belt Activity Tolerance: Patient tolerated treatment well Patient left: in chair;with call bell/phone within reach;with chair alarm set Nurse Communication: Mobility status PT Visit Diagnosis: Other abnormalities of gait and mobility (R26.89);Difficulty in walking, not elsewhere classified (R26.2);Muscle weakness (generalized) (M62.81)    Time: 9086-9060 PT Time Calculation (min) (ACUTE ONLY): 26 min   Charges:   PT Evaluation $PT Eval Moderate Complexity: 1 Mod PT Treatments $Therapeutic Activity: 8-22 mins PT General Charges $$ ACUTE PT VISIT: 1 Visit         Lenoard SQUIBB, PT Acute Rehabilitation Services Office: [phone]   Lenoard NOVAK [name] 11/01/2023, 9:54 AM

## 2023-11-01 NOTE — Plan of Care (Signed)

## 2023-11-01 NOTE — Progress Notes (Signed)
 Progress Note   Patient: [name] FMW:[id number] DOB: 07/23/1945 DOA: 10/30/2023  DOS: the patient was seen and examined on 11/01/2023   Brief hospital course:  78 y.o. female with medical history significant of hypertension, hyperlipidemia, CAD, CVA with residual left-sided deficits, asthma, diabetes mellitus type 2, rheumatoid arthritis, depression, GERD, and obesity presents with complaints of progressive weakness.   Assessment and Plan:  Physical debilitation muscle weakness - Patient with progressive weakness.  Stated that she was unable to get up out of bed yesterday.  CT scan not noting any acute abnormality.  MRI noting no acute abnormality in the spine with no evidence of cord compression or high-grade stenosis.  Does mention Left foraminal to extraforaminal disc protrusion at L2-3, potentially affecting the exiting left L2 nerve root.  Possibility of weakness could be exacerbated by dehydration or underlying chronic debilitation.  Will order PT/OT.  Persistent nausea and vomiting with diarrhea - Reportedly having symptoms anytime she eats.  Unclear etiology.  Reported symptoms this morning.  Antiemetics on board.  Abdominal x-ray unremarkable.  Mild hypokalemia - Replenishment initiated.  Will recheck BMP in AM.  Acute kidney injury - Creatinine 1.14 with previous baseline 0.7.  Showing improvement after IV fluid hydration.  Likely secondary to prerenal/dehydration.  Essential hypertension - Metoprolol , amlodipine  on board.  Holding Lasix for now.  RA - May be contributing to patient's weakness.  No flare at this time.  Subjective: Patient resting comfortably this morning.  States she feels improved from presentation.  Still feeling weak but eager to work with physical therapy.  Denies any fever, chills, chest pain, nausea, vomiting, abdominal pain.  Physical Exam:  Vitals:   10/31/23 2109 11/01/23 0028 11/01/23 0331 11/01/23 0700  BP: (!) 114/59 (!) 123/54 (!)  119/52 117/68  Pulse: 80 77 75 77  Resp: 20 20 20 18   Temp: 98.9 F (37.2 C) 98.8 F (37.1 C) 98.4 F (36.9 C) 98.9 F (37.2 C)  TempSrc: Oral Oral Oral Oral  SpO2: 96% 98% 100% 93%  Weight:      Height:        GENERAL:  Alert, pleasant, no acute distress  HEENT:  EOMI CARDIOVASCULAR:  RRR, no murmurs appreciated RESPIRATORY:  Clear to auscultation, no wheezing, rales, or rhonchi GASTROINTESTINAL:  Soft, nontender, nondistended EXTREMITIES:  No LE edema bilaterally NEURO:  No new focal deficits appreciated SKIN:  No rashes noted PSYCH:  Appropriate mood and affect     Data Reviewed:  No new imaging to review  Previous records (including but not limited to H&P, progress notes, nursing notes, TOC management) were reviewed in assessment of this patient.  Labs: CBC: Recent Labs  Lab 10/30/23 1827 11/01/23 0213  WBC 6.1 5.1  NEUTROABS 3.6  --   HGB 13.8 11.7*  HCT 37.7 32.7*  MCV 87.1 87.4  PLT 278 231   Basic Metabolic Panel: Recent Labs  Lab 10/30/23 1827 11/01/23 0213  NA 138 139  K 3.0* 3.6  CL 103 108  CO2 24 26  GLUCOSE 118* 115*  BUN 8 10  CREATININE 1.14* 0.90  CALCIUM  9.2 8.5*  MG 1.9  --    Liver Function Tests: No results for input(s): AST, ALT, ALKPHOS, BILITOT, PROT, ALBUMIN in the last 168 hours. CBG: No results for input(s): GLUCAP in the last 168 hours.  Scheduled Meds:  enoxaparin (LOVENOX) injection  40 mg Subcutaneous Q24H   sodium chloride  flush  3 mL Intravenous Q12H   Continuous Infusions: PRN Meds:.acetaminophen  **  OR** acetaminophen , albuterol , ondansetron  **OR** ondansetron  (ZOFRAN ) IV  Family Communication: None at bedside  Disposition: Status is: Observation The patient remains OBS appropriate and will d/c before 2 midnights.     Time spent: 36 minutes  Length of inpatient stay: 0 days  Author: Carliss LELON Canales, DO 11/01/2023 10:44 AM  For on call review [email or web address].

## 2023-11-01 NOTE — Care Management Obs Status (Signed)
 MEDICARE OBSERVATION STATUS NOTIFICATION   Patient Details  Name: [name] MRN: [id number] Date of Birth: August 07, 1945   Medicare Observation Status Notification Given:  Yes  Letter signed and copy given  Claretta Deed 11/01/2023, 10:05 AM

## 2023-11-01 NOTE — Evaluation (Signed)
 Occupational Therapy Evaluation Patient Details Name: [name] MRN: [id number] DOB: 1945/08/13 Today's Date: 11/01/2023   History of Present Illness   78 yo female admitted 10/30/23 with progressive weakness for 2 months, unable to even sit up. PMhx; chronic diarrhea, HTN, T2DM, HLD, CAD, asthma, RA     Clinical Impressions Pt presents with decline in function and safety with ADLs and ADL mobility with impaired strength, balance, endurance, hs of L UE/hand deformity; hx of falls. PTA pt lives alone and has CNA 7 days a week for assist with bathing, LB dressing and to and walk in home with RW. Pt reports that she was Mod I with UB dressing, toilet transfers, toileting and heats meals in microwave. Pt currently requires mod A with UB ADLs, max - total A with LB ADLs, max-total A with toileting and mod/min A with mobility/transfers using RW. Pt would benefit from acute OT services to maximize level of function and safety     If plan is discharge home, recommend the following:   A lot of help with bathing/dressing/bathroom;A little help with walking and/or transfers;Assistance with cooking/housework;Assist for transportation     Functional Status Assessment   Patient has had a recent decline in their functional status and demonstrates the ability to make significant improvements in function in a reasonable and predictable amount of time.     Equipment Recommendations   Tub/shower bench     Recommendations for Other Services         Precautions/Restrictions   Precautions Precautions: Fall Recall of Precautions/Restrictions: Intact Restrictions Weight Bearing Restrictions Per Provider Order: No     Mobility Bed Mobility               General bed mobility comments: pt in chair upon arrival    Transfers Overall transfer level: Needs assistance   Transfers: Sit to/from Stand Sit to Stand: Mod assist, Min assist           General transfer comment:  mod A sit - stand from recliner, min A from [facility], cues for hand placement      Balance Overall balance assessment: Needs assistance, History of Falls Sitting-balance support: No upper extremity supported, Feet supported Sitting balance-Leahy Scale: Fair     Standing balance support: Bilateral upper extremity supported, Reliant on assistive device for balance, During functional activity Standing balance-Leahy Scale: Poor                             ADL either performed or assessed with clinical judgement   ADL Overall ADL's : Needs assistance/impaired Eating/Feeding: Set up;Independent;Sitting Eating/Feeding Details (indicate cue type and reason): set up due to L hand/UE deformity Grooming: Wash/dry hands;Wash/dry face;Contact guard assist;Sitting   Upper Body Bathing: Moderate assistance;Sitting   Lower Body Bathing: Maximal assistance   Upper Body Dressing : Moderate assistance;Sitting   Lower Body Dressing: Total assistance   Toilet Transfer: Moderate assistance;Minimal assistance;Rolling walker (2 wheels);Cueing for safety;Stand-pivot;BSC/3in1   Toileting- Clothing Manipulation and Hygiene: Maximal assistance;Sit to/from stand       Functional mobility during ADLs: Moderate assistance;Minimal assistance;Cueing for safety;Rolling walker (2 wheels) General ADL Comments: pt has PCA 7 days/wk for assist with bathing and LB dressing at baseline     Vision Baseline Vision/History: 1 Wears glasses Ability to See in Adequate Light: 0 Adequate Patient Visual Report: No change from baseline       Perception  Praxis         Pertinent Vitals/Pain Pain Assessment Pain Assessment: 0-10 Pain Score: 5  Pain Location: B knees Pain Descriptors / Indicators: Aching, Discomfort Pain Intervention(s): Limited activity within patient's tolerance, Monitored during session, Repositioned     Extremity/Trunk Assessment Upper Extremity Assessment Upper Extremity  Assessment: Generalized weakness LUE Deficits / Details: L hand/UE deformity; pt reports as accident as infant with weakness grossly 2+/5   Lower Extremity Assessment Lower Extremity Assessment: Defer to PT evaluation LLE Deficits / Details: 4/5 hip flexion and knee extension   Cervical / Trunk Assessment Cervical / Trunk Assessment: Normal   Communication Communication Communication: No apparent difficulties   Cognition Arousal: Alert Behavior During Therapy: WFL for tasks assessed/performed                                 Following commands: Intact       Cueing  General Comments   Cueing Techniques: Verbal cues      Exercises     Shoulder Instructions      Home Living Family/patient expects to be discharged to:: Private residence Living Arrangements: Alone Available Help at Discharge: Personal care attendant Type of Home: Apartment Home Access: Stairs to enter Entrance Stairs-Number of Steps: 3 Entrance Stairs-Rails: Right Home Layout: One level     Bathroom Shower/Tub: Chief Strategy Officer: Standard     Home Equipment: Agricultural consultant (2 wheels);Cane - single point;Shower seat          Prior Functioning/Environment Prior Level of Function : Needs assist             Mobility Comments: pt reports she walks in apartment with RW, doesn't leave without assist, 4 falls in the last year ADLs Comments: normally walks to bathroom, CNA daily for IADLs and assist with bathing, some    OT Problem List: Decreased strength;Decreased range of motion;Decreased coordination;Decreased activity tolerance;Impaired balance (sitting and/or standing);Pain;Impaired UE functional use   OT Treatment/Interventions: Self-care/ADL training;Therapeutic exercise;Patient/family education;Balance training;Therapeutic activities;DME and/or AE instruction      OT Goals(Current goals can be found in the care plan section)   Acute Rehab OT  Goals Patient Stated Goal: go to rehab, the home OT Goal Formulation: With patient Time For Goal Achievement: 11/15/23 Potential to Achieve Goals: Good ADL Goals Pt Will Perform Grooming: with supervision;with set-up;sitting Pt Will Perform Upper Body Bathing: with min assist;with contact guard assist;sitting Pt Will Perform Upper Body Dressing: with min assist;with contact guard assist;sitting Pt Will Transfer to Toilet: with min assist;with contact guard assist;stand pivot transfer Pt Will Perform Toileting - Clothing Manipulation and hygiene: with mod assist;with min assist;sitting/lateral leans;sit to/from stand   OT Frequency:  Min 2X/week    Co-evaluation              AM-PAC OT 6 Clicks Daily Activity     Outcome Measure Help from another person eating meals?: A Little Help from another person taking care of personal grooming?: A Little Help from another person toileting, which includes using toliet, bedpan, or urinal?: A Lot Help from another person bathing (including washing, rinsing, drying)?: A Lot Help from another person to put on and taking off regular upper body clothing?: A Lot Help from another person to put on and taking off regular lower body clothing?: Total 6 Click Score: 13   End of Session Equipment Utilized During Treatment: Gait belt;Rolling walker (2 wheels);Other (comment) (  BSC) Nurse Communication: Mobility status  Activity Tolerance: Patient limited by fatigue Patient left: in chair;with call bell/phone within reach;with chair alarm set  OT Visit Diagnosis: Unsteadiness on feet (R26.81);Other abnormalities of gait and mobility (R26.89);Pain;Muscle weakness (generalized) (M62.81);History of falling (Z91.81) Pain - Right/Left:  (bilaterally) Pain - part of body: Knee                Time: 9057-8990 OT Time Calculation (min): 27 min Charges:  OT General Charges $OT Visit: 1 Visit OT Evaluation $OT Eval Moderate Complexity: 1 Mod OT  Treatments $Therapeutic Activity: 8-22 mins    Jacques Karna Loose 11/01/2023, 1:15 PM

## 2023-11-01 NOTE — Plan of Care (Signed)

## 2023-11-02 DIAGNOSIS — N289 Disorder of kidney and ureter, unspecified: Secondary | ICD-10-CM | POA: Diagnosis not present

## 2023-11-02 DIAGNOSIS — R531 Weakness: Secondary | ICD-10-CM | POA: Diagnosis not present

## 2023-11-02 DIAGNOSIS — R112 Nausea with vomiting, unspecified: Secondary | ICD-10-CM | POA: Diagnosis not present

## 2023-11-02 DIAGNOSIS — E876 Hypokalemia: Secondary | ICD-10-CM | POA: Diagnosis not present

## 2023-11-02 LAB — BASIC METABOLIC PANEL WITH GFR
Anion gap: 9 (ref 5–15)
BUN: 9 mg/dL (ref 8–23)
CO2: 26 mmol/L (ref 22–32)
Calcium: 8.8 mg/dL — ABNORMAL LOW (ref 8.9–10.3)
Chloride: 106 mmol/L (ref 98–111)
Creatinine, Ser: 0.92 mg/dL (ref 0.44–1.00)
GFR, Estimated: >60 mL/min (ref >60)
Glucose, Bld: 107 mg/dL — ABNORMAL HIGH (ref 70–99)
Potassium: 4.2 mmol/L (ref 3.5–5.1)
Sodium: 141 mmol/L (ref 135–145)

## 2023-11-02 LAB — CBC
HCT: 34.6 % — ABNORMAL LOW (ref 36.0–46.0)
Hemoglobin: 12.2 g/dL (ref 12.0–15.0)
MCH: 31.3 pg (ref 26.0–34.0)
MCHC: 35.3 g/dL (ref 30.0–36.0)
MCV: 88.7 fL (ref 80.0–100.0)
Platelets: 240 10*3/uL (ref 150–400)
RBC: 3.9 MIL/uL (ref 3.87–5.11)
RDW: 13 % (ref 11.5–15.5)
WBC: 4.6 10*3/uL (ref 4.0–10.5)
nRBC: 0 % (ref 0.0–0.2)

## 2023-11-02 LAB — MAGNESIUM: Magnesium: 2 mg/dL (ref 1.7–2.4)

## 2023-11-02 NOTE — Progress Notes (Signed)
 Progress Note   Patient: [name] FMW:[id number] DOB: Sep 08, 1945 DOA: 10/30/2023  DOS: the patient was seen and examined on 11/02/2023   Brief hospital course:  78 y.o. female with medical history significant of hypertension, hyperlipidemia, CAD, CVA with residual left-sided deficits, asthma, diabetes mellitus type 2, rheumatoid arthritis, depression, GERD, and obesity presents with complaints of progressive weakness.    Assessment and Plan:   Physical debilitation muscle weakness - Patient with progressive weakness.  Stated that she was unable to get up out of bed yesterday.  CT scan not noting any acute abnormality.  MRI noting no acute abnormality in the spine with no evidence of cord compression or high-grade stenosis.  Does mention Left foraminal to extraforaminal disc protrusion at L2-3, potentially affecting the exiting left L2 nerve root.  Possibility of weakness could be exacerbated by dehydration or underlying chronic debilitation.  PT eval recommending SNF.  TOC working on disposition.  Bed available, pending Auth.   Persistent nausea and vomiting with diarrhea - Reportedly having symptoms anytime she eats.  Unclear etiology.  Abdominal x-ray unremarkable.  Appears resolved.   Mild hypokalemia - Replenishment initiated.  Will recheck BMP in AM.   Acute kidney injury - On presentation, creatinine 1.14 with previous baseline 0.7.  Showing improvement after IV fluid hydration.  Likely secondary to prerenal/dehydration.  Appears resolved at this time.   Essential hypertension - Metoprolol , amlodipine  on board.  Holding Lasix for now.   RA - May be contributing to patient's weakness.  No flare at this time.   Subjective: Patient feeling improved this morning.  Able to attempt ambulating to the bathroom.  Feeling improved, in good spirits.  Denies any worsening fever, chills, chest pain, nausea, vomiting, abdominal pain.  Physical Exam:  Vitals:   11/01/23 2359  11/02/23 0349 11/02/23 0811 11/02/23 1140  BP:  135/67 (!) 153/76 (!) 147/86  Pulse: 67 60 100 68  Resp: 18 18 17 17   Temp: 98.3 F (36.8 C) 97.8 F (36.6 C) 98.9 F (37.2 C) 98 F (36.7 C)  TempSrc: Oral Oral    SpO2: 97% 98% 95% 97%  Weight:      Height:        GENERAL:  Alert, pleasant, no acute distress  HEENT:  EOMI CARDIOVASCULAR:  RRR, no murmurs appreciated RESPIRATORY:  Clear to auscultation, no wheezing, rales, or rhonchi GASTROINTESTINAL:  Soft, nontender, nondistended EXTREMITIES:  No LE edema bilaterally NEURO:  No new focal deficits appreciated SKIN:  No rashes noted PSYCH:  Appropriate mood and affect     Data Reviewed:  There are no new results to review at this time.  Previous records (including but not limited to H&P, progress notes, nursing notes, TOC management) were reviewed in assessment of this patient.  Labs: CBC: Recent Labs  Lab 10/30/23 1827 11/01/23 0213 11/02/23 0230  WBC 6.1 5.1 4.6  NEUTROABS 3.6  --   --   HGB 13.8 11.7* 12.2  HCT 37.7 32.7* 34.6*  MCV 87.1 87.4 88.7  PLT 278 231 240   Basic Metabolic Panel: Recent Labs  Lab 10/30/23 1827 11/01/23 0213 11/02/23 0230  NA 138 139 141  K 3.0* 3.6 4.2  CL 103 108 106  CO2 24 26 26   GLUCOSE 118* 115* 107*  BUN 8 10 9   CREATININE 1.14* 0.90 0.92  CALCIUM  9.2 8.5* 8.8*  MG 1.9  --  2.0   Liver Function Tests: No results for input(s): AST, ALT, ALKPHOS, BILITOT, PROT, ALBUMIN in  the last 168 hours. CBG: No results for input(s): GLUCAP in the last 168 hours.  Scheduled Meds:  enoxaparin  (LOVENOX ) injection  40 mg Subcutaneous Q24H   sodium chloride  flush  3 mL Intravenous Q12H   Continuous Infusions: PRN Meds:.acetaminophen  **OR** acetaminophen , albuterol , ondansetron  **OR** ondansetron  (ZOFRAN ) IV  Family Communication: None at bedside  Disposition: Status is: Observation The patient remains OBS appropriate and will d/c before 2  midnights.     Time spent: 34 minutes  Length of inpatient stay: 0 days  Author: Carliss LELON Canales, DO 11/02/2023 12:03 PM  For on call review [email or web address].

## 2023-11-02 NOTE — Plan of Care (Signed)

## 2023-11-02 NOTE — TOC Progression Note (Signed)
 Transition of Care [facility]) - Progression Note    Patient Details  Name: [name] MRN: [id number] Date of Birth: 1945/10/31  [facility]) CM/SW Contact  Sritha Chauncey A [place], LCSW Phone Number: 11/02/2023, 10:07 AM  Clinical Narrative:     CSW met with pt at bedside to provide bed offers with Medicare.gov ratings. She stated she wanted to attend [facility] for short term rehab. CSW started insurance authorization. Status pending, [name] ID: [id number]  [name] has bed available today.   TOC will continue to follow.    Expected Discharge Plan: Skilled Nursing Facility Barriers to Discharge: Continued Medical Work up, English as a second language teacher, SNF Pending bed offer  Expected Discharge Plan and Services                                               Social Determinants of Health (SDOH) Interventions SDOH Screenings   Food Insecurity: No Food Insecurity (10/31/2023)  Housing: Low Risk  (10/31/2023)  Transportation Needs: No Transportation Needs (10/31/2023)  Utilities: Not At Risk (10/31/2023)  Social Connections: Socially Isolated (10/31/2023)  Tobacco Use: Medium Risk (10/30/2023)    Readmission Risk Interventions     No data to display

## 2023-11-03 DIAGNOSIS — R197 Diarrhea, unspecified: Secondary | ICD-10-CM | POA: Diagnosis not present

## 2023-11-03 DIAGNOSIS — E876 Hypokalemia: Secondary | ICD-10-CM | POA: Diagnosis not present

## 2023-11-03 DIAGNOSIS — R531 Weakness: Secondary | ICD-10-CM | POA: Diagnosis not present

## 2023-11-03 DIAGNOSIS — R112 Nausea with vomiting, unspecified: Secondary | ICD-10-CM | POA: Diagnosis not present

## 2023-11-03 NOTE — TOC Transition Note (Signed)
 Transition of Care [facility]) - Discharge Note   Patient Details  Name: [name] MRN: [id number] Date of Birth: 29-May-1945  Transition of Care [facility]) CM/SW Contact:  Gwenn Julien [place], [place] Phone Number: 11/03/2023, 10:35 AM   Clinical Narrative:  [name] received for [facility] [phone], 6/27-7/1) and confirmed with [facility] in admissions they are prepared to admit pt to room 224 today. Pt aware of dc and reports agreeable. RN provided with number for report and PTAR arranged for transport. SW signing off at dc.   Julien Gwenn, MSW, LCSW [phone] (coverage)       Final next level of care: Skilled Nursing Facility Barriers to Discharge: Barriers Resolved   Patient Goals and CMS Choice   CMS Medicare.gov Compare Post Acute Care list provided to:: Patient Choice offered to / list presented to : Patient [place] ownership interest in [facility].provided to:: Patient    Discharge Placement              Patient chooses bed at: [facility] Patient to be transferred to facility by: PTAR Name of family member notified: Pt to update family Patient and family notified of of transfer: 11/03/23  Discharge Plan and Services Additional resources added to the After Visit Summary for                                       Social Drivers of Health (SDOH) Interventions SDOH Screenings   Food Insecurity: No Food Insecurity (10/31/2023)  Housing: Low Risk  (10/31/2023)  Transportation Needs: No Transportation Needs (10/31/2023)  Utilities: Not At Risk (10/31/2023)  Social Connections: Socially Isolated (10/31/2023)  Tobacco Use: Medium Risk (10/30/2023)     Readmission Risk Interventions     No data to display

## 2023-11-03 NOTE — Progress Notes (Signed)
 Called [facility] to give report. Nurse was busy and will call me back.

## 2023-11-03 NOTE — Discharge Summary (Signed)
 Physician Discharge Summary   Patient: [name] MRN: [id number] DOB: July 02, 1945  Admit date:     10/30/2023  Discharge date: 11/03/23  Discharge Physician: [name]   PCP: [name]   Recommendations at discharge:    Pt to be discharged to SNF.   If you experience worsening fever, chills, chest pain, shortness of breath, or other concerning symptoms, please call your PCP or go to the emergency department immediately.  Discharge Diagnoses: Principal Problem:   Generalized weakness Active Problems:   Nausea vomiting and diarrhea   Hypokalemia   Renal insufficiency   Essential hypertension   Rheumatoid arthritis (HCC)   Hyperlipidemia  Resolved Problems:   * No resolved hospital problems. *   Hospital Course:  78 y.o. female with medical history significant of hypertension, hyperlipidemia, CAD, CVA with residual left-sided deficits, asthma, diabetes mellitus type 2, rheumatoid arthritis, depression, GERD, and obesity presents with complaints of progressive weakness.    Assessment and Plan:   Physical debilitation muscle weakness - Patient with progressive weakness.  Stated that she was unable to get up out of bed yesterday.  CT scan not noting any acute abnormality.  MRI noting no acute abnormality in the spine with no evidence of cord compression or high-grade stenosis.  Does mention Left foraminal to extraforaminal disc protrusion at L2-3, potentially affecting the exiting left L2 nerve root.  Possibility of weakness could be exacerbated by dehydration or underlying chronic debilitation.  PT/OT recommending STR.   Persistent nausea and vomiting with diarrhea - Reportedly having symptoms anytime she eats.  Unclear etiology.  Appears resolved.  Mild hypokalemia - Resolved after replenishment   Acute kidney injury - Creatinine 1.14 with previous baseline 0.7.  Showing improvement after IV fluid hydration.  Likely secondary to prerenal/dehydration.   Resolved.   Essential hypertension - Can resume normal home antihypertensive regimen upon discharge.   RA - May be contributing to patient's weakness.  No flare at this time.   Consultants: None Procedures performed: None Disposition: Skilled nursing facility Diet recommendation:  Discharge Diet Orders (From admission, onward)     Start     Ordered   11/03/23 0000  Diet - low sodium heart healthy        11/03/23 1027           Cardiac diet  DISCHARGE MEDICATION: Allergies as of 11/03/2023   No Known Allergies      Medication List     STOP taking these medications    Breyna  160-4.5 MCG/ACT inhaler Generic drug: budesonide -formoterol    omeprazole  20 MG capsule Commonly known as: PRILOSEC       TAKE these medications    albuterol  108 (90 Base) MCG/ACT inhaler Commonly known as: VENTOLIN  HFA Inhale 2 puffs into the lungs every 6 (six) hours as needed for wheezing or shortness of breath.   amLODipine  5 MG tablet Commonly known as: NORVASC  Take 1.5 tablets (7.5 mg total) by mouth daily. What changed:  how much to take when to take this   aspirin  EC 81 MG tablet Take 1 tablet (81 mg total) by mouth daily. Swallow whole.   benztropine 0.5 MG tablet Commonly known as: COGENTIN Take 0.5 mg by mouth 2 (two) times daily.   Breztri  Aerosphere 160-9-4.8 MCG/ACT Aero inhaler Generic drug: budesonide -glycopyrrolate-formoterol  Inhale 2 puffs into the lungs in the morning and at bedtime. What changed: when to take this   buPROPion 150 MG 24 hr tablet Commonly known as: WELLBUTRIN XL  Take 150 mg by mouth at bedtime.   esomeprazole  40 MG capsule Commonly known as: NexIUM  Take 1 capsule (40 mg total) by mouth daily at 12 noon.   fluPHENAZine 5 MG tablet Commonly known as: PROLIXIN Take 5 mg by mouth 3 (three) times daily.   furosemide 40 MG tablet Commonly known as: LASIX Take 40 mg by mouth at bedtime.   gabapentin 400 MG capsule Commonly known as:  NEURONTIN Take 400-800 mg by mouth See admin instructions. Take 800mg  (2 capsules) by mouth every morning and an additional 400mg  (1 capsule) as needed.   guaifenesin 100 MG/5ML syrup Commonly known as: ROBITUSSIN Take 200 mg by mouth 3 (three) times daily as needed for cough.   Ingrezza 80 MG capsule Generic drug: valbenazine Take 80 mg by mouth 2 (two) times daily.   meclizine  12.5 MG tablet Commonly known as: ANTIVERT  Take 1 tablet (12.5 mg total) by mouth 3 (three) times daily as needed for dizziness. What changed: when to take this   metoprolol  succinate 50 MG 24 hr tablet Commonly known as: TOPROL -XL Take 50 mg by mouth daily.   montelukast  10 MG tablet Commonly known as: Singulair  Take 1 tablet (10 mg total) by mouth at bedtime.   nitroGLYCERIN  0.3 MG SL tablet Commonly known as: NITROSTAT  Place 0.3 mg under the tongue every 5 (five) minutes as needed for chest pain.   oxymetazoline 0.05 % nasal spray Commonly known as: AFRIN Place 2-3 sprays into both nostrils daily.   rosuvastatin  10 MG tablet Commonly known as: CRESTOR  Take 1 tablet (10 mg total) by mouth daily.   Vitamin D (Ergocalciferol) 1.25 MG (50000 UNIT) Caps capsule Commonly known as: DRISDOL Take 50,000 Units by mouth once a week.        Contact information for after-discharge care     Destination     [facility] [place], [place] .   Service: Skilled Nursing Contact information: [address] [place]  [phone]                     Discharge Exam: [name]   10/30/23 1757  Weight: 86.6 kg    GENERAL:  Alert, pleasant, no acute distress  HEENT:  EOMI CARDIOVASCULAR:  RRR, no murmurs appreciated RESPIRATORY:  Clear to auscultation, no wheezing, rales, or rhonchi GASTROINTESTINAL:  Soft, nontender, nondistended EXTREMITIES:  No LE edema bilaterally NEURO:  No new focal deficits appreciated SKIN:  No rashes noted PSYCH:  Appropriate  mood and affect     Condition at discharge: improving  The results of significant diagnostics from this hospitalization (including imaging, microbiology, ancillary and laboratory) are listed below for reference.   Imaging Studies: DG Abd 2 Views Result Date: 10/31/2023 CLINICAL DATA:  Nausea, vomiting and diarrhea. EXAM: ABDOMEN - 2 VIEW COMPARISON:  CT abdomen and pelvis 06/08/2022 FINDINGS: Nonobstructive bowel gas pattern. Mild curvature in the thoracolumbar spine. No large abdominopelvic calcifications. No evidence for free air on the upright view. IMPRESSION: No acute abnormality. Electronically Signed   By: Juliene Balder M.D.   On: 10/31/2023 11:56   MR LUMBAR SPINE WO CONTRAST Result Date: 10/31/2023 CLINICAL DATA:  Initial evaluation for acute low back pain, cauda equina syndrome suspected. EXAM: MRI LUMBAR SPINE WITHOUT CONTRAST TECHNIQUE: Multiplanar, multisequence MR imaging of the lumbar spine was performed. No intravenous contrast was administered. COMPARISON:  Comparison made with concomitant MRI of the thoracic spine performed on the same day. FINDINGS: Segmentation: Transitional features about the  lumbosacral junction with sacralization of the L5 vertebral body and rudimentary L5-S1 interspace. Vertebral body count is made from the dens on corresponding thoracic spine MRI. Alignment: Levoscoliosis. 2 mm facet mediated anterolisthesis of L3 on L4 and L4 on L5. Vertebrae: Chronic endplate Schmorl's node deformity with mild height loss noted at the superior endplate of L1. Vertebral body height otherwise maintained with no other acute or chronic fracture. Bone marrow signal intensity within normal limits. Prominent 3.3 cm hemangioma noted involving the L3 vertebral body. No worrisome osseous lesions. No abnormal marrow edema. Conus medullaris and cauda equina: Conus extends to the L1 level. Conus and cauda equina appear normal. Incidental note made of a tiny fatty filum terminale. Paraspinal  and other soft tissues: Paraspinous soft tissues within normal limits. 7 mm T2 hyperintense left renal cyst partially visualized, benign in appearance, no follow-up imaging recommended. Disc levels: L1-2: Disc desiccation with mild left eccentric disc bulge. Left-sided reactive endplate spurring. Mild facet and ligament flavum hypertrophy. No significant spinal stenosis. Mild bilateral L1 foraminal narrowing. L2-3: Mild left eccentric disc bulge. Left-sided reactive endplate spurring. Superimposed left foraminal to extraforaminal disc protrusion contacts the exiting left L2 nerve root (series 20, image 15). Mild to moderate facet and ligament flavum hypertrophy. Resultant mild spinal stenosis. Mild right with moderate left L2 foraminal stenosis. L3-4: 2 mm anterolisthesis. Disc desiccation with mild disc bulge. Moderate facet and ligament flavum hypertrophy with associated small joint effusions. Resultant mild narrowing of the lateral recesses bilaterally. Central canal remains patent. Mild right with mild to moderate left L3 foraminal stenosis. L4-5: Disc desiccation with diffuse disc bulge. Reactive endplate spurring. Moderate to advanced bilateral facet arthrosis. No significant spinal stenosis. Moderate left worse than right L4 foraminal stenosis. L5-S1: Transitional lumbosacral anatomy with sacralization of the L5 vertebral body. L5-S1 disc is rudimentary. No disc bulge or focal disc herniation. No stenosis or impingement. IMPRESSION: 1. No acute abnormality within the lumbar spine. No evidence for cord compression or high-grade spinal stenosis. 2. Left foraminal to extraforaminal disc protrusion at L2-3, potentially affecting the exiting left L2 nerve root. 3. Multifactorial degenerative changes at L3-4 and L4-5 with resultant mild spinal stenosis, with mild to moderate bilateral L3 and L4 foraminal narrowing as above. 4. 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 Careful  correlation with numbering system on this exam recommended prior to any potential future intervention. Electronically Signed   By: Morene Hoard M.D.   On: 10/31/2023 04:14   MR THORACIC SPINE WO CONTRAST Result Date: 10/31/2023 CLINICAL DATA:  Initial evaluation for acute trauma numbness or tingling. EXAM: MRI THORACIC SPINE WITHOUT CONTRAST TECHNIQUE: Multiplanar, multisequence MR imaging of the thoracic spine was performed. No intravenous contrast was administered. COMPARISON:  None Available. FINDINGS: Alignment: Dextroscoliosis. Alignment otherwise normal with preservation of the normal thoracic kyphosis. No significant listhesis. Vertebrae: Vertebral body height maintained without acute or chronic fracture. Bone marrow signal intensity overall within normal limits. Few scattered benign hemangiomata noted, most prominent of which present within the T6 vertebral body. No worrisome osseous lesions. No abnormal marrow edema. Cord:  Normal signal and morphology. Paraspinal and other soft tissues: Unremarkable. Disc levels: T1-2: Disc bulge with reactive endplate spurring. Superimposed left paracentral to foraminal disc protrusion. Mild facet hypertrophy. No significant spinal stenosis. Moderate left with mild right foraminal stenosis. T2-3: Disc bulge with endplate spurring. Mild right-sided facet hypertrophy. No spinal stenosis. Mild bilateral foraminal narrowing. T3-4: Disc desiccation with mild disc bulge. Reactive endplate spurring. No significant  canal or foraminal stenosis. T4-5: Mild disc bulge with reactive endplate spurring. Mild right-sided facet spurring. No spinal stenosis. Foramina remain patent. T5-6: Mild endplate spurring without significant disc bulge. Mild posterior element hypertrophy. No canal or foraminal stenosis. T6-7: Disc bulge with endplate spurring, asymmetric to the left. No significant spinal stenosis. Foramina remain patent. T7-8: Disc desiccation with diffuse disc bulge,  asymmetric to the left. Reactive endplate spurring. Mild left-sided facet hypertrophy. No significant spinal stenosis. Foramina remain patent. T8-9: Mild disc bulge with reactive endplate spurring. Mild posterior element hypertrophy. No spinal stenosis. Mild bilateral foraminal narrowing. T9-10: Mild disc bulge with endplate spurring. Mild bilateral facet hypertrophy. No significant spinal stenosis. Mild bilateral foraminal narrowing. T10-11: Mild diffuse disc bulge with reactive endplate spurring. Mild bilateral facet hypertrophy. No significant spinal stenosis. Mild left with moderate right foraminal stenosis. T11-12: Mild disc bulge with endplate spurring. Right worse than left facet hypertrophy. No spinal stenosis. Mild left with moderate right foraminal stenosis. T12-L1: Mild disc bulge with reactive endplate spurring. Chronic endplate Schmorl's node deformity at the superior endplate of L1. Mild right greater left facet hypertrophy. No significant spinal stenosis. Moderate right foraminal narrowing. Left neural foramen remains patent. IMPRESSION: 1. No acute abnormality within the thoracic spine. Normal MRI appearance of the thoracic spinal cord. 2. Dextroscoliosis with multilevel thoracic spondylosis without significant spinal stenosis. Mild to moderate multilevel foraminal narrowing as above. Electronically Signed   By: Morene Hoard M.D.   On: 10/31/2023 04:01   DG Chest 1 View Result Date: 10/30/2023 CLINICAL DATA:  Chest pain EXAM: CHEST  1 VIEW COMPARISON:  Chest x-ray 07/13/2021 FINDINGS: The heart is enlarged. There is no focal lung infiltrate, pleural effusion or pneumothorax. Aorta is ectatic. No acute fractures are seen. There are degenerative changes of the right shoulder. IMPRESSION: Cardiomegaly. No active disease. Electronically Signed   By: Greig Pique M.D.   On: 10/30/2023 19:56   CT Head Wo Contrast Result Date: 10/30/2023 CLINICAL DATA:  Poly trauma, history of prior stroke  with left-sided deficits, difficulty walking. EXAM: CT HEAD WITHOUT CONTRAST TECHNIQUE: Contiguous axial images were obtained from the base of the skull through the vertex without intravenous contrast. RADIATION DOSE REDUCTION: This exam was performed according to the departmental dose-optimization program which includes automated exposure control, adjustment of the mA and/or kV according to patient size and/or use of iterative reconstruction technique. COMPARISON:  MRI head 11/15/2020. FINDINGS: Brain: No acute intracranial hemorrhage. No CT evidence of acute infarct. No edema, mass effect, or midline shift. The basilar cisterns are patent. Ventricles: The ventricles are normal. Vascular: No hyperdense vessel or unexpected calcification. Skull: No acute or aggressive finding. Orbits: Orbits are symmetric. Sinuses: The visualized paranasal sinuses are clear. Other: Trace fluid in the left mastoid air cells. IMPRESSION: No CT evidence of acute intracranial abnormality. Electronically Signed   By: Donnice Mania M.D.   On: 10/30/2023 19:12    Microbiology: Results for orders placed or performed during the hospital encounter of 10/30/23  Resp panel by RT-PCR (RSV, Flu A&B, Covid) Anterior Nasal Swab     Status: None   Collection Time: 10/30/23  6:28 PM   Specimen: Anterior Nasal Swab  Result Value Ref Range Status   SARS Coronavirus 2 by RT PCR NEGATIVE NEGATIVE Final   Influenza A by PCR NEGATIVE NEGATIVE Final   Influenza B by PCR NEGATIVE NEGATIVE Final    Comment: (NOTE) The Xpert Xpress SARS-CoV-2/FLU/RSV plus assay is intended as an aid in the diagnosis  of influenza from Nasopharyngeal swab specimens and should not be used as a sole basis for treatment. Nasal washings and aspirates are unacceptable for Xpert Xpress SARS-CoV-2/FLU/RSV testing.  Fact Sheet for Patients: BloggerCourse.com  Fact Sheet for Healthcare  Providers: SeriousBroker.it  This test is not yet approved or cleared by the [place]  FDA and has been authorized for detection and/or diagnosis of SARS-CoV-2 by FDA under an Emergency Use Authorization (EUA). This EUA will remain in effect (meaning this test can be used) for the duration of the COVID-19 declaration under Section 564(b)(1) of the Act, 21 U.S.C. section 360bbb-3(b)(1), unless the authorization is terminated or revoked.     Resp Syncytial Virus by PCR NEGATIVE NEGATIVE Final    Comment: (NOTE) Fact Sheet for Patients: BloggerCourse.com  Fact Sheet for Healthcare Providers: SeriousBroker.it  This test is not yet approved or cleared by the [place]  FDA and has been authorized for detection and/or diagnosis of SARS-CoV-2 by FDA under an Emergency Use Authorization (EUA). This EUA will remain in effect (meaning this test can be used) for the duration of the COVID-19 declaration under Section 564(b)(1) of the Act, 21 U.S.C. section 360bbb-3(b)(1), unless the authorization is terminated or revoked.  Performed at [facility], [address] [place], [place] [address]     Labs: CBC: Recent Labs  Lab 10/30/23 1827 11/01/23 0213 11/02/23 0230  WBC 6.1 5.1 4.6  NEUTROABS 3.6  --   --   HGB 13.8 11.7* 12.2  HCT 37.7 32.7* 34.6*  MCV 87.1 87.4 88.7  PLT 278 231 240   Basic Metabolic Panel: Recent Labs  Lab 10/30/23 1827 11/01/23 0213 11/02/23 0230  NA 138 139 141  K 3.0* 3.6 4.2  CL 103 108 106  CO2 24 26 26   GLUCOSE 118* 115* 107*  BUN 8 10 9   CREATININE 1.14* 0.90 0.92  CALCIUM  9.2 8.5* 8.8*  MG 1.9  --  2.0   Liver Function Tests: No results for input(s): AST, ALT, ALKPHOS, BILITOT, PROT, ALBUMIN in the last 168 hours. CBG: No results for input(s): GLUCAP in the last 168 hours.  Discharge time spent: 24 minutes.  Length of inpatient stay:  0 days  Signed: Carliss LELON Canales, DO [facility] 11/03/2023

## 2023-11-03 NOTE — Progress Notes (Signed)
 Arlyne from [facility] called back to get report on patient.

## 2023-11-15 ENCOUNTER — Encounter: Admitting: Internal Medicine

## 2023-11-15 ENCOUNTER — Telehealth: Payer: Self-pay | Admitting: Internal Medicine

## 2023-11-15 DIAGNOSIS — E1142 Type 2 diabetes mellitus with diabetic polyneuropathy: Secondary | ICD-10-CM | POA: Insufficient documentation

## 2023-11-15 DIAGNOSIS — M19079 Primary osteoarthritis, unspecified ankle and foot: Secondary | ICD-10-CM | POA: Insufficient documentation

## 2023-11-15 DIAGNOSIS — Z8673 Personal history of transient ischemic attack (TIA), and cerebral infarction without residual deficits: Secondary | ICD-10-CM | POA: Insufficient documentation

## 2023-11-15 DIAGNOSIS — E119 Type 2 diabetes mellitus without complications: Secondary | ICD-10-CM | POA: Insufficient documentation

## 2023-11-15 NOTE — Telephone Encounter (Signed)
 Good Morning Dr. Federico,  I called this patient at 10:45 am today and could not leave a message the mailbox was full.  I will NO SHOW her. [employer]

## 2023-11-24 ENCOUNTER — Emergency Department (HOSPITAL_COMMUNITY)
Admission: EM | Admit: 2023-11-24 | Discharge: 2023-11-24 | Disposition: A | Discharge status: Home / Self Care | Attending: Emergency Medicine | Admitting: Emergency Medicine

## 2023-11-24 DIAGNOSIS — Z8673 Personal history of transient ischemic attack (TIA), and cerebral infarction without residual deficits: Secondary | ICD-10-CM | POA: Insufficient documentation

## 2023-11-24 DIAGNOSIS — M545 Low back pain, unspecified: Secondary | ICD-10-CM | POA: Insufficient documentation

## 2023-11-24 DIAGNOSIS — Z7982 Long term (current) use of aspirin: Secondary | ICD-10-CM | POA: Insufficient documentation

## 2023-11-24 DIAGNOSIS — T148XXA Other injury of unspecified body region, initial encounter: Secondary | ICD-10-CM

## 2023-11-24 MED ORDER — OXYCODONE-ACETAMINOPHEN 5-325 MG PO TABS
1.0000 | ORAL_TABLET | Freq: Once | ORAL | Status: AC
  Administered 2023-11-24: 1 via ORAL
  Filled 2023-11-24: qty 1

## 2023-11-24 MED ORDER — DIAZEPAM 2 MG PO TABS: 2.0000 mg | ORAL_TABLET | Freq: Four times a day (QID) | ORAL | 0 refills | Status: DC | PRN

## 2023-11-24 MED ORDER — OXYCODONE-ACETAMINOPHEN 5-325 MG PO TABS
1.0000 | ORAL_TABLET | Freq: Four times a day (QID) | ORAL | 0 refills | Status: DC | PRN
Start: 2023-11-24 — End: 2024-01-01

## 2023-11-24 MED ORDER — DIAZEPAM 2 MG PO TABS
2.0000 mg | ORAL_TABLET | Freq: Once | ORAL | Status: AC
  Administered 2023-11-24: 2 mg via ORAL
  Filled 2023-11-24: qty 1

## 2023-11-24 NOTE — ED Triage Notes (Signed)
 Per [facility] from home. C/o back pain. Hx of stroke w/ left sided deficits, weakness in legs.   BP 126/72 HR 76 RR 20 94 on RA

## 2023-11-24 NOTE — ED Provider Notes (Signed)
 [place] EMERGENCY DEPARTMENT AT [facility] Provider Note   CSN: [id number] Arrival date & time: 11/24/23  1133     Patient presents with: Back Pain   [name] is a 78 y.o. female.   78 year old female presents with bilateral low back pain without urinary symptoms.  Patient states she stepped on a couch last night and when she awoke at sharp pain it was worse with movements.  It is not radicular.  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  Has had chronic weakness in her legs for over a month and has been seen by her physician for this.  Denies any bladder retention or bowel incontinence.  Denies any new neurological features at this time.  No rashes appreciated.  Spoke to EMS about patient's condition as well 2.  No treatment used prior to arrival       Prior to Admission medications   Medication Sig Start Date End Date Taking? Authorizing Provider  albuterol  (VENTOLIN  HFA) 108 (90 Base) MCG/ACT inhaler Inhale 2 puffs into the lungs every 6 (six) hours as needed for wheezing or shortness of breath. 03/09/23   Lorin Norris, MD  amLODipine  (NORVASC ) 5 MG tablet Take 1.5 tablets (7.5 mg total) by mouth daily. Patient taking differently: Take 5 mg by mouth 2 (two) times daily. 09/11/23   Wyn Jackee VEAR Mickey., NP  aspirin  EC 81 MG tablet Take 1 tablet (81 mg total) by mouth daily. Swallow whole. Patient not taking: Reported on 10/31/2023 07/14/22   Vannie Reche GORMAN, NP  benztropine  (COGENTIN ) 0.5 MG tablet Take 0.5 mg by mouth 2 (two) times daily. 10/12/23   [provider]  Budeson-Glycopyrrol-Formoterol  (BREZTRI  AEROSPHERE) 160-9-4.8 MCG/ACT AERO Inhale 2 puffs into the lungs in the morning and at bedtime. Patient taking differently: Inhale 2 puffs into the lungs daily. 03/09/23   Lorin Norris, MD  buPROPion  (WELLBUTRIN  XL) 150 MG 24 hr tablet Take 150 mg by mouth at bedtime. 03/22/22   [provider]  esomeprazole   (NEXIUM ) 40 MG capsule Take 1 capsule (40 mg total) by mouth daily at 12 noon. 07/31/22   Walker, Caitlin S, NP  fluPHENAZine (PROLIXIN) 5 MG tablet Take 5 mg by mouth 3 (three) times daily.    [provider]  furosemide  (LASIX ) 40 MG tablet Take 40 mg by mouth at bedtime.    [provider]  gabapentin  (NEURONTIN ) 400 MG capsule Take 400-800 mg by mouth See admin instructions. Take 800mg  (2 capsules) by mouth every morning and an additional 400mg  (1 capsule) as needed. 05/07/22   [provider]  guaifenesin (ROBITUSSIN) 100 MG/5ML syrup Take 200 mg by mouth 3 (three) times daily as needed for cough.    [provider]  meclizine  (ANTIVERT ) 12.5 MG tablet Take 1 tablet (12.5 mg total) by mouth 3 (three) times daily as needed for dizziness. Patient taking differently: Take 12.5 mg by mouth at bedtime. 11/12/20   Laurice Maude BROCKS, MD  metoprolol  succinate (TOPROL -XL) 50 MG 24 hr tablet Take 50 mg by mouth daily. 10/30/23   [provider]  montelukast  (SINGULAIR ) 10 MG tablet Take 1 tablet (10 mg total) by mouth at bedtime. 01/12/23   Lorin Norris, MD  nitroGLYCERIN  (NITROSTAT ) 0.3 MG SL tablet Place 0.3 mg under the tongue every 5 (five) minutes as needed for chest pain. 12/17/19   [provider]  oxymetazoline (AFRIN) 0.05 % nasal spray Place 2-3 sprays into both nostrils  daily.    [provider]  rosuvastatin  (CRESTOR ) 10 MG tablet Take 1 tablet (10 mg total) by mouth daily. 09/11/23 09/05/24  Wyn Jackee VEAR Mickey., NP  valbenazine  (INGREZZA ) 80 MG capsule Take 80 mg by mouth 2 (two) times daily.    [provider]  Vitamin D, Ergocalciferol, (DRISDOL) 1.25 MG (50000 UNIT) CAPS capsule Take 50,000 Units by mouth once a week. 05/07/22   [provider]    Allergies: Patient has no known allergies.    Review of Systems  All other systems reviewed and are negative.   Updated Vital Signs BP 116/65   Pulse 66   Temp 98.2  F (36.8 C)   Resp 16   SpO2 100%   Physical Exam Vitals and nursing note reviewed.  Constitutional:      General: She is not in acute distress.    Appearance: Normal appearance. She is well-developed. She is not toxic-appearing.  HENT:     Head: Normocephalic and atraumatic.  Eyes:     General: Lids are normal.     Conjunctiva/sclera: Conjunctivae normal.     Pupils: Pupils are equal, round, and reactive to light.  Neck:     Thyroid : No thyroid  mass.     Trachea: No tracheal deviation.  Cardiovascular:     Rate and Rhythm: Normal rate and regular rhythm.     Heart sounds: Normal heart sounds. No murmur heard.    No gallop.  Pulmonary:     Effort: Pulmonary effort is normal. No respiratory distress.     Breath sounds: Normal breath sounds. No stridor. No decreased breath sounds, wheezing, rhonchi or rales.  Abdominal:     General: There is no distension.     Palpations: Abdomen is soft.     Tenderness: There is no abdominal tenderness. There is no rebound.  Musculoskeletal:        General: No tenderness. Normal range of motion.     Cervical back: Normal range of motion and neck supple.       Back:  Skin:    General: Skin is warm and dry.     Findings: No abrasion or rash.  Neurological:     Mental Status: She is alert and oriented to person, place, and time. Mental status is at baseline.     GCS: GCS eye subscore is 4. GCS verbal subscore is 5. GCS motor subscore is 6.     Cranial Nerves: No cranial nerve deficit.     Sensory: No sensory deficit.     Motor: Motor function is intact.     Comments: This 5 of 5 bilateral lower extremities.  Normal dorsal and plantarflexion noted.  Psychiatric:        Attention and Perception: Attention normal.        Speech: Speech normal.        Behavior: Behavior normal.     (all labs ordered are listed, but only abnormal results are displayed) Labs Reviewed - No data to display  EKG: None  Radiology: No results  found.   Procedures   Medications Ordered in the ED  diazepam  (VALIUM ) tablet 2 mg (has no administration in time range)  oxyCODONE -acetaminophen  (PERCOCET/ROXICET) 5-325 MG per tablet 1 tablet (has no administration in time range)                                    Medical Decision  Making Risk Prescription drug management.   Patient likely MSK back pain.  Responded well to Valium  and oxycodone .  Her neurological complaints are chronic in nature.  I do not feel that they need further evaluation at this time.  Encouraged to follow with her doctor.  Patient prescribed analgesics and muscle relaxants for her musculoskeletal back.     Final diagnoses:  None    ED Discharge Orders     None          Dasie Faden, MD 11/24/23 1255

## 2023-11-26 ENCOUNTER — Other Ambulatory Visit: Payer: Self-pay

## 2023-11-26 ENCOUNTER — Emergency Department (HOSPITAL_COMMUNITY): Admission: EM | Admit: 2023-11-26 | Discharge: 2023-11-28 | Disposition: A | Discharge status: Home / Self Care

## 2023-11-26 ENCOUNTER — Emergency Department (HOSPITAL_COMMUNITY)

## 2023-11-26 ENCOUNTER — Encounter (HOSPITAL_COMMUNITY): Payer: Self-pay | Admitting: Emergency Medicine

## 2023-11-26 DIAGNOSIS — R262 Difficulty in walking, not elsewhere classified: Secondary | ICD-10-CM | POA: Insufficient documentation

## 2023-11-26 DIAGNOSIS — Z79899 Other long term (current) drug therapy: Secondary | ICD-10-CM | POA: Diagnosis not present

## 2023-11-26 DIAGNOSIS — Z7982 Long term (current) use of aspirin: Secondary | ICD-10-CM | POA: Diagnosis not present

## 2023-11-26 DIAGNOSIS — M545 Low back pain, unspecified: Secondary | ICD-10-CM | POA: Insufficient documentation

## 2023-11-26 DIAGNOSIS — I10 Essential (primary) hypertension: Secondary | ICD-10-CM | POA: Diagnosis not present

## 2023-11-26 DIAGNOSIS — M549 Dorsalgia, unspecified: Secondary | ICD-10-CM | POA: Diagnosis present

## 2023-11-26 DIAGNOSIS — M6281 Muscle weakness (generalized): Secondary | ICD-10-CM | POA: Insufficient documentation

## 2023-11-26 LAB — CBC WITH DIFFERENTIAL/PLATELET
Abs Immature Granulocytes: 0.02 K/uL (ref 0.00–0.07)
Basophils Absolute: 0 K/uL (ref 0.0–0.1)
Basophils Relative: 0 %
Eosinophils Absolute: 0.1 K/uL (ref 0.0–0.5)
Eosinophils Relative: 1 %
HCT: 39.1 % (ref 36.0–46.0)
Hemoglobin: 13.8 g/dL (ref 12.0–15.0)
Immature Granulocytes: 0 %
Lymphocytes Relative: 30 %
Lymphs Abs: 2.2 K/uL (ref 0.7–4.0)
MCH: 30.7 pg (ref 26.0–34.0)
MCHC: 35.3 g/dL (ref 30.0–36.0)
MCV: 86.9 fL (ref 80.0–100.0)
Monocytes Absolute: 0.7 K/uL (ref 0.1–1.0)
Monocytes Relative: 9 %
Neutro Abs: 4.4 K/uL (ref 1.7–7.7)
Neutrophils Relative %: 60 %
Platelets: 303 K/uL (ref 150–400)
RBC: 4.5 MIL/uL (ref 3.87–5.11)
RDW: 12.4 % (ref 11.5–15.5)
WBC: 7.4 K/uL (ref 4.0–10.5)
nRBC: 0 % (ref 0.0–0.2)

## 2023-11-26 LAB — COMPREHENSIVE METABOLIC PANEL WITH GFR
ALT: 12 U/L (ref 0–44)
AST: 12 U/L — ABNORMAL LOW (ref 15–41)
Albumin: 3.6 g/dL (ref 3.5–5.0)
Alkaline Phosphatase: 64 U/L (ref 38–126)
Anion gap: 12 (ref 5–15)
BUN: 7 mg/dL — ABNORMAL LOW (ref 8–23)
CO2: 23 mmol/L (ref 22–32)
Calcium: 9.1 mg/dL (ref 8.9–10.3)
Chloride: 102 mmol/L (ref 98–111)
Creatinine, Ser: 0.88 mg/dL (ref 0.44–1.00)
GFR, Estimated: >60 mL/min (ref >60)
Glucose, Bld: 124 mg/dL — ABNORMAL HIGH (ref 70–99)
Potassium: 3.1 mmol/L — ABNORMAL LOW (ref 3.5–5.1)
Sodium: 137 mmol/L (ref 135–145)
Total Bilirubin: 0.8 mg/dL (ref 0.0–1.2)
Total Protein: 7.5 g/dL (ref 6.5–8.1)

## 2023-11-26 MED ORDER — OXYCODONE-ACETAMINOPHEN 5-325 MG PO TABS
1.0000 | ORAL_TABLET | Freq: Once | ORAL | Status: AC
  Administered 2023-11-26: 1 via ORAL
  Filled 2023-11-26: qty 1

## 2023-11-26 MED ORDER — KETOROLAC TROMETHAMINE 15 MG/ML IJ SOLN
15.0000 mg | Freq: Once | INTRAMUSCULAR | Status: AC
  Administered 2023-11-26: 15 mg via INTRAMUSCULAR
  Filled 2023-11-26: qty 1

## 2023-11-26 NOTE — ED Provider Notes (Signed)
 [name]   CSN: [id number] Arrival date & time: 11/26/23  1952     Patient presents with: Back Pain   [name] is a 78 y.o. female.   78 year old female with past medical history of hypertension and CVA in the past with left-sided deficits presenting to the emergency department today with back pain.  The patient was seen here 2 days ago for back pain.  Reports that it has been persistent since then.  This is causing difficulty ambulating for the patient.  She denies any bowel or bladder dysfunction and has not had any saddle anesthesia.  She denies any fevers.  She states that she has had difficulty getting around at home.  She is currently living by herself and does have caregivers that help her but she has not been able to get around due to the pain over the past 2 days.  She denies any known injuries.   Back Pain      Prior to Admission medications   Medication Sig Start Date End Date Taking? Authorizing Provider  albuterol  (VENTOLIN  HFA) 108 (90 Base) MCG/ACT inhaler Inhale 2 puffs into the lungs every 6 (six) hours as needed for wheezing or shortness of breath. 03/09/23   Lorin Norris, MD  amLODipine  (NORVASC ) 5 MG tablet Take 1.5 tablets (7.5 mg total) by mouth daily. Patient taking differently: Take 5 mg by mouth 2 (two) times daily. 09/11/23   Wyn Jackee VEAR Mickey., NP  aspirin  EC 81 MG tablet Take 1 tablet (81 mg total) by mouth daily. Swallow whole. Patient not taking: Reported on 10/31/2023 07/14/22   Vannie Reche GORMAN, NP  benztropine  (COGENTIN ) 0.5 MG tablet Take 0.5 mg by mouth 2 (two) times daily. 10/12/23   [provider]  Budeson-Glycopyrrol-Formoterol  (BREZTRI  AEROSPHERE) 160-9-4.8 MCG/ACT AERO Inhale 2 puffs into the lungs in the morning and at bedtime. Patient taking differently: Inhale 2 puffs into the lungs daily. 03/09/23   Lorin Norris, MD  buPROPion  (WELLBUTRIN  XL) 150 MG 24 hr tablet  Take 150 mg by mouth at bedtime. 03/22/22   [provider]  diazepam  (VALIUM ) 2 MG tablet Take 1 tablet (2 mg total) by mouth every 6 (six) hours as needed for muscle spasms. 11/24/23   Dasie Faden, MD  esomeprazole  (NEXIUM ) 40 MG capsule Take 1 capsule (40 mg total) by mouth daily at 12 noon. 07/31/22   Walker, Caitlin S, NP  fluPHENAZine (PROLIXIN) 5 MG tablet Take 5 mg by mouth 3 (three) times daily.    [provider]  furosemide  (LASIX ) 40 MG tablet Take 40 mg by mouth at bedtime.    [provider]  gabapentin  (NEURONTIN ) 400 MG capsule Take 400-800 mg by mouth See admin instructions. Take 800mg  (2 capsules) by mouth every morning and an additional 400mg  (1 capsule) as needed. 05/07/22   [provider]  guaifenesin (ROBITUSSIN) 100 MG/5ML syrup Take 200 mg by mouth 3 (three) times daily as needed for cough.    [provider]  meclizine  (ANTIVERT ) 12.5 MG tablet Take 1 tablet (12.5 mg total) by mouth 3 (three) times daily as needed for dizziness. Patient taking differently: Take 12.5 mg by mouth at bedtime. 11/12/20   Laurice Maude BROCKS, MD  metoprolol  succinate (TOPROL -XL) 50 MG 24 hr tablet Take 50 mg by mouth daily. 10/30/23   [provider]  montelukast  (SINGULAIR ) 10 MG tablet Take 1 tablet (10 mg total) by mouth at bedtime. 01/12/23  Lorin Norris, MD  nitroGLYCERIN  (NITROSTAT ) 0.3 MG SL tablet Place 0.3 mg under the tongue every 5 (five) minutes as needed for chest pain. 12/17/19   [provider]  oxyCODONE -acetaminophen  (PERCOCET/ROXICET) 5-325 MG tablet Take 1 tablet by mouth every 6 (six) hours as needed for severe pain (pain score 7-10). 11/24/23   Dasie Faden, MD  oxymetazoline (AFRIN) 0.05 % nasal spray Place 2-3 sprays into both nostrils daily.    [provider]  rosuvastatin  (CRESTOR ) 10 MG tablet Take 1 tablet (10 mg total) by mouth daily. 09/11/23 09/05/24  Wyn Jackee VEAR Mickey., NP  valbenazine  (INGREZZA ) 80  MG capsule Take 80 mg by mouth 2 (two) times daily.    [provider]  Vitamin D, Ergocalciferol, (DRISDOL) 1.25 MG (50000 UNIT) CAPS capsule Take 50,000 Units by mouth once a week. 05/07/22   [provider]    Allergies: Patient has no known allergies.    Review of Systems  Musculoskeletal:  Positive for back pain.    Updated Vital Signs BP 115/63   Pulse 76   Temp 98.2 F (36.8 C)   Resp 18   Ht 5' 1 (1.549 m)   Wt 86.6 kg   SpO2 98%   BMI 36.07 kg/m   Physical Exam Vitals and nursing Crane reviewed.   Gen: NAD Eyes: PERRL, EOMI HEENT: no oropharyngeal swelling Neck: trachea midline Resp: clear to auscultation bilaterally Card: RRR, no murmurs, rubs, or gallops Abd: nontender, nondistended Extremities: no calf tenderness, no edema MSK: Tender over the mid lumbar spine with no step-offs or deformities Vascular: 2+ radial pulses bilaterally, 2+ DP pulses bilaterally Neuro: Sided hemiparesis noted although patient does have equal strength throughout the bilateral lower extremities. Skin: no rashes Psyc: acting appropriately   (all labs ordered are listed, but only abnormal results are displayed) Labs Reviewed  COMPREHENSIVE METABOLIC PANEL WITH GFR - Abnormal; Notable for the following components:      Result Value   Potassium 3.1 (*)    Glucose, Bld 124 (*)    BUN 7 (*)    AST 12 (*)    All other components within normal limits  CBC WITH DIFFERENTIAL/PLATELET  URINALYSIS, ROUTINE W REFLEX MICROSCOPIC    EKG: None  Radiology: DG Lumbar Spine Complete Result Date: 11/26/2023 CLINICAL DATA:  Chronic back pain EXAM: LUMBAR SPINE - COMPLETE 4+ VIEW COMPARISON:  MRI 10/31/2023 FINDINGS: Transitional anatomy, for the purposes of reporting 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 Levo scoliosis. Vertebral body heights are maintained. Moderate degenerative change at T12-L1. Moderate lower lumbar facet  degenerative changes. 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 Levoscoliosis with degenerative changes. Electronically Signed   By: Luke Bun M.D.   On: 11/26/2023 21:39     Procedures   Medications Ordered in the ED  ketorolac  (TORADOL ) 15 MG/ML injection 15 mg (15 mg Intramuscular Given 11/26/23 2156)  oxyCODONE -acetaminophen  (PERCOCET/ROXICET) 5-325 MG per tablet 1 tablet (1 tablet Oral Given 11/26/23 2159)                                    Medical Decision Making 78 year old female with past medical history of CVA with left-sided deficits presenting to the emergency department today with low back pain.  This is interfering with the patient's caring for self at home given her baseline left-sided weakness.  Will  further evaluate the patient here with a urinalysis and basic labs to evaluate for anemia or electrolyte abnormalities or UTI which may be exacerbating her symptoms.  Lumbar spine x-ray is ordered and does not show any acute lytic or blastic lesions or pathologic fractures.  Patient given Percocet for pain.  Plan is for reevaluation but she will likely require ED abs stay at a minimum for physical therapy evaluation as she has been struggling to care for self at home.  Signed out with UA pending.  Amount and/or Complexity of Data Reviewed Labs: ordered. Radiology: ordered.  Risk Prescription drug management.        Final diagnoses:  Low back pain, unspecified back pain laterality, unspecified chronicity, unspecified whether sciatica present    ED Discharge Orders     None          Ula Prentice SAUNDERS, MD 11/26/23 2352

## 2023-11-26 NOTE — ED Notes (Signed)
 Attempted to take pt to [facility], pt states at home she goes in a w/c, full 2 person assist to stand pt and then back up in bed, pt unable to assist due to weakness, left sided deficits from prior stroke, pt reports she lives alone but has a CNA that come 4 hr daily, pt states she has no other assistance, reports PT is supposed to start in her home this week

## 2023-11-26 NOTE — ED Triage Notes (Signed)
 Patient BIB EMS from home c/o back pain. Patient report worsening back pain after walking with physical therapy today. Patient report she's unable to stand up and walk with caregiver tonight. Patient denies fall. Hx of CVA , left sided weakness.

## 2023-11-27 DIAGNOSIS — M545 Low back pain, unspecified: Secondary | ICD-10-CM | POA: Diagnosis not present

## 2023-11-27 LAB — URINALYSIS, ROUTINE W REFLEX MICROSCOPIC
Bilirubin Urine: NEGATIVE
Glucose, UA: NEGATIVE mg/dL
Hgb urine dipstick: NEGATIVE
Ketones, ur: NEGATIVE mg/dL
Leukocytes,Ua: NEGATIVE
Nitrite: NEGATIVE
Protein, ur: NEGATIVE mg/dL
Specific Gravity, Urine: 1.008 (ref 1.005–1.030)
pH: 6 (ref 5.0–8.0)

## 2023-11-27 MED ORDER — GABAPENTIN 400 MG PO CAPS: 400.0000 mg | ORAL_CAPSULE | ORAL | Status: DC

## 2023-11-27 MED ORDER — ALBUTEROL SULFATE HFA 108 (90 BASE) MCG/ACT IN AERS: 2.0000 | INHALATION_SPRAY | Freq: Four times a day (QID) | RESPIRATORY_TRACT | Status: DC | PRN

## 2023-11-27 MED ORDER — OXYCODONE-ACETAMINOPHEN 5-325 MG PO TABS
1.0000 | ORAL_TABLET | Freq: Four times a day (QID) | ORAL | Status: DC | PRN
  Administered 2023-11-27: 1 via ORAL
  Filled 2023-11-27: qty 1

## 2023-11-27 MED ORDER — DIAZEPAM 2 MG PO TABS: 2.0000 mg | ORAL_TABLET | Freq: Four times a day (QID) | ORAL | Status: DC | PRN

## 2023-11-27 MED ORDER — BENZTROPINE MESYLATE 0.5 MG PO TABS
0.5000 mg | ORAL_TABLET | Freq: Two times a day (BID) | ORAL | Status: DC
  Administered 2023-11-27 – 2023-11-28 (×4): 0.5 mg via ORAL
  Filled 2023-11-27 (×4): qty 1

## 2023-11-27 MED ORDER — PANTOPRAZOLE SODIUM 40 MG PO TBEC
80.0000 mg | DELAYED_RELEASE_TABLET | Freq: Every day | ORAL | Status: DC
  Administered 2023-11-27: 80 mg via ORAL
  Filled 2023-11-27: qty 2

## 2023-11-27 MED ORDER — FUROSEMIDE 40 MG PO TABS
40.0000 mg | ORAL_TABLET | Freq: Every day | ORAL | Status: DC
  Administered 2023-11-27 – 2023-11-28 (×2): 40 mg via ORAL
  Filled 2023-11-27 (×2): qty 1

## 2023-11-27 MED ORDER — ROSUVASTATIN CALCIUM 20 MG PO TABS
10.0000 mg | ORAL_TABLET | Freq: Every day | ORAL | Status: DC
  Administered 2023-11-27 – 2023-11-28 (×2): 10 mg via ORAL
  Filled 2023-11-27 (×2): qty 1

## 2023-11-27 MED ORDER — METOPROLOL SUCCINATE ER 50 MG PO TB24
50.0000 mg | ORAL_TABLET | Freq: Every day | ORAL | Status: DC
  Administered 2023-11-27 – 2023-11-28 (×2): 50 mg via ORAL
  Filled 2023-11-27 (×2): qty 1

## 2023-11-27 MED ORDER — BUDESON-GLYCOPYRROL-FORMOTEROL 160-9-4.8 MCG/ACT IN AERO
2.0000 | INHALATION_SPRAY | Freq: Every day | RESPIRATORY_TRACT | Status: DC
  Administered 2023-11-27: 2 via RESPIRATORY_TRACT
  Filled 2023-11-27: qty 5.9

## 2023-11-27 MED ORDER — BUPROPION HCL ER (XL) 150 MG PO TB24
150.0000 mg | ORAL_TABLET | Freq: Every day | ORAL | Status: DC
  Administered 2023-11-27 – 2023-11-28 (×2): 150 mg via ORAL
  Filled 2023-11-27 (×2): qty 1

## 2023-11-27 MED ORDER — VALBENAZINE TOSYLATE 40 MG PO CAPS
80.0000 mg | ORAL_CAPSULE | Freq: Two times a day (BID) | ORAL | Status: DC
  Administered 2023-11-27 – 2023-11-28 (×4): 80 mg via ORAL
  Filled 2023-11-27 (×4): qty 2

## 2023-11-27 MED ORDER — MONTELUKAST SODIUM 10 MG PO TABS
10.0000 mg | ORAL_TABLET | Freq: Every day | ORAL | Status: DC
  Administered 2023-11-27 (×2): 10 mg via ORAL
  Filled 2023-11-27 (×2): qty 1

## 2023-11-27 NOTE — ED Provider Notes (Signed)
 Care of patient assumed from Dr. Ula.  This patient presents with acute on chronic back pain.  This has caused her increased difficulty with mobility and ADLs.  Currently awaiting results of urinalysis.  Plan will be for PT eval and TOC consult in the morning. Physical Exam  BP 115/63   Pulse 76   Temp 98.2 F (36.8 C)   Resp 18   Ht 5' 1 (1.549 m)   Wt 86.6 kg   SpO2 98%   BMI 36.07 kg/m   Physical Exam Vitals and nursing note reviewed.  Constitutional:      General: She is not in acute distress.    Appearance: Normal appearance. She is well-developed. She is not ill-appearing, toxic-appearing or diaphoretic.  HENT:     Head: Normocephalic and atraumatic.     Right Ear: External ear normal.     Left Ear: External ear normal.     Nose: Nose normal.     Mouth/Throat:     Mouth: Mucous membranes are moist.  Eyes:     Extraocular Movements: Extraocular movements intact.     Conjunctiva/sclera: Conjunctivae normal.  Cardiovascular:     Rate and Rhythm: Normal rate and regular rhythm.  Pulmonary:     Effort: Pulmonary effort is normal. No respiratory distress.  Abdominal:     General: There is no distension.     Palpations: Abdomen is soft.  Musculoskeletal:        General: No swelling or deformity.     Cervical back: Normal range of motion and neck supple.  Skin:    General: Skin is warm and dry.     Coloration: Skin is not jaundiced or pale.  Neurological:     Mental Status: She is alert and oriented to person, place, and time. Mental status is at baseline.  Psychiatric:        Mood and Affect: Mood normal.        Behavior: Behavior normal.     Procedures  Procedures  ED Course / MDM    Medical Decision Making Amount and/or Complexity of Data Reviewed Labs: ordered. Radiology: ordered.  Risk Prescription drug management.   On assessment, patient resting comfortably.  Urinalysis does not show evidence of infection.  She is due for her evening medications.   These were ordered.  Patient to remain in the ED for PT evaluation in the morning.       Melvenia Motto, MD 11/27/23 [phone]

## 2023-11-27 NOTE — Progress Notes (Signed)
Awaiting PT eval.  

## 2023-11-27 NOTE — ED Provider Notes (Signed)
 Emergency Medicine Observation Re-evaluation Note  CHERRIE FRANCA is a 78 y.o. female, seen on rounds today.  Pt initially presented to the ED for complaints of Back Pain Currently, the patient is pending physical therapy and social work evaluation.SABRA  Physical Exam  BP (!) 94/53 (BP Location: Left Arm)   Pulse (!) 59   Temp (!) 97 F (36.1 C) (Axillary)   Resp 16   Ht 5' 1 (1.549 m)   Wt 86.6 kg   SpO2 97%   BMI 36.07 kg/m  Physical Exam General: Chronically ill Cardiac: No murmurs Lungs: clear  Psych: sleeping   ED Course / MDM  EKG:   I have reviewed the labs performed to date as well as medications administered while in observation.  Recent changes in the last 24 hours include normal UA .  Plan  Current plan is for social work placement for rehab. Patient is here with weakness and workup in the ER is unremarkable.  In particular patient does not have a urinary tract infection.  Physical therapy and social work to see patient this morning    Patt Alm Macho, MD 11/27/23 [phone]

## 2023-11-27 NOTE — [place] FL2 (Signed)
 [place]  MEDICAID FL2 LEVEL OF CARE FORM     IDENTIFICATION  Patient Name: [name] Birthdate: 10/27/1945 Sex: female Admission Date (Current Location): 11/26/2023  [facility] and [place] Number:  [employer] and Address:  [facility],  [address], [place] [address]      Provider Number: [id number]  Attending Physician Name and Address:  Patt Alm Macho, MD  Relative Name and Phone Number:  karoline israel (Niece)  [phone] [facility] Phone)    Current Level of Care: Hospital Recommended Level of Care: Skilled Nursing Facility Prior Approval Number:    Date Approved/Denied:   PASRR Number: [id number]  Discharge Plan: SNF    Current Diagnoses: Patient Active Problem List   Diagnosis Date Noted   History of stroke without residual deficits 11/15/2023   Polyneuropathy due to type 2 diabetes mellitus (HCC) 11/15/2023   Primary localized osteoarthrosis of ankle and foot 11/15/2023   Type 2 diabetes mellitus (HCC) 11/15/2023   Generalized weakness 10/31/2023   Nausea vomiting and diarrhea 10/31/2023   Hypokalemia 10/31/2023   Renal insufficiency 10/31/2023   Essential hypertension 10/31/2023   Rheumatoid arthritis (HCC) 10/31/2023   Hyperlipidemia 10/31/2023   Moderate persistent asthma with acute exacerbation 12/05/2017   Perennial allergic rhinitis 09/20/2017   Infection of flexor tendon sheath 08/06/2017   Open wound of left hand, subsequent encounter 08/06/2017   Pain of finger of left hand 08/06/2017   Major depressive disorder 07/31/2017   Insomnia 02/23/2015   Obstructive sleep apnea syndrome 02/23/2015   Asthma 02/23/2015   Chest pain on exertion 04/10/2013   Menopausal hot flushes 01/22/2013   Frequency of urination 01/22/2013    Orientation RESPIRATION BLADDER Height & Weight     Self, Time, Situation, Place  Normal Continent Weight: 190 lb 14.7 oz (86.6 kg) Height:  5' 1 (154.9 cm)  BEHAVIORAL SYMPTOMS/MOOD  NEUROLOGICAL BOWEL NUTRITION STATUS      Continent Diet (Regular)  AMBULATORY STATUS COMMUNICATION OF NEEDS Skin   Extensive Assist Verbally Normal                       Personal Care Assistance Level of Assistance  Bathing, Feeding, Dressing Bathing Assistance: Limited assistance Feeding assistance: Independent Dressing Assistance: Limited assistance     Functional Limitations Info  Sight, Hearing, Speech Sight Info: Adequate Hearing Info: Adequate Speech Info: Adequate    SPECIAL CARE FACTORS FREQUENCY  PT (By licensed PT), OT (By licensed OT)     PT Frequency: x5/week OT Frequency: x5/week            Contractures Contractures Info: Not present    Additional Factors Info  Code Status, Allergies Code Status Info: Full Allergies Info: No Known Allergies           Current Medications (11/27/2023):  This is the current hospital active medication list Current Facility-Administered Medications  Medication Dose Route Frequency Provider Last Rate Last Admin   albuterol  (VENTOLIN  HFA) 108 (90 Base) MCG/ACT inhaler 2 puff  2 puff Inhalation Q6H PRN Melvenia Motto, MD       benztropine  (COGENTIN ) tablet 0.5 mg  0.5 mg Oral BID Melvenia Motto, MD   0.5 mg at 11/27/23 0915   budesonide -glycopyrrolate -formoterol  (BREZTRI ) 160-9-4.8 MCG/ACT inhaler 2 puff  2 puff Inhalation Daily Melvenia Motto, MD   2 puff at 11/27/23 9165   buPROPion  (WELLBUTRIN  XL) 24 hr tablet 150 mg  150 mg Oral Daily Melvenia Motto, MD   150 mg at  11/27/23 0130   diazepam  (VALIUM ) tablet 2 mg  2 mg Oral Q6H PRN Melvenia Motto, MD       furosemide  (LASIX ) tablet 40 mg  40 mg Oral Daily Melvenia Motto, MD   40 mg at 11/27/23 0130   gabapentin  (NEURONTIN ) capsule 400-800 mg  400-800 mg Oral See admin instructions Melvenia Motto, MD       metoprolol  succinate (TOPROL -XL) 24 hr tablet 50 mg  50 mg Oral Daily Melvenia Motto, MD   50 mg at 11/27/23 0915   montelukast  (SINGULAIR ) tablet 10 mg  10 mg Oral QHS Melvenia Motto, MD   10 mg  at 11/27/23 9685   oxyCODONE -acetaminophen  (PERCOCET/ROXICET) 5-325 MG per tablet 1 tablet  1 tablet Oral Q6H PRN Melvenia Motto, MD       pantoprazole  (PROTONIX ) EC tablet 80 mg  80 mg Oral Q1200 Melvenia Motto, MD       rosuvastatin  (CRESTOR ) tablet 10 mg  10 mg Oral Daily Melvenia Motto, MD   10 mg at 11/27/23 0915   valbenazine  (INGREZZA ) capsule 80 mg  80 mg Oral BID Melvenia Motto, MD   80 mg at 11/27/23 9062   Current Outpatient Medications  Medication Sig Dispense Refill   albuterol  (VENTOLIN  HFA) 108 (90 Base) MCG/ACT inhaler Inhale 2 puffs into the lungs every 6 (six) hours as needed for wheezing or shortness of breath. 8.5 g 0   amLODipine  (NORVASC ) 5 MG tablet Take 1.5 tablets (7.5 mg total) by mouth daily. (Patient taking differently: Take 5 mg by mouth 2 (two) times daily.) 90 tablet 0   aspirin  EC 81 MG tablet Take 1 tablet (81 mg total) by mouth daily. Swallow whole. (Patient not taking: Reported on 10/31/2023) 90 tablet 3   benztropine  (COGENTIN ) 0.5 MG tablet Take 0.5 mg by mouth 2 (two) times daily.     Budeson-Glycopyrrol-Formoterol  (BREZTRI  AEROSPHERE) 160-9-4.8 MCG/ACT AERO Inhale 2 puffs into the lungs in the morning and at bedtime. (Patient taking differently: Inhale 2 puffs into the lungs daily.) 10.7 g 3   buPROPion  (WELLBUTRIN  XL) 150 MG 24 hr tablet Take 150 mg by mouth at bedtime.     diazepam  (VALIUM ) 2 MG tablet Take 1 tablet (2 mg total) by mouth every 6 (six) hours as needed for muscle spasms. 15 tablet 0   esomeprazole  (NEXIUM ) 40 MG capsule Take 1 capsule (40 mg total) by mouth daily at 12 noon. 90 capsule 3   fluPHENAZine (PROLIXIN) 5 MG tablet Take 5 mg by mouth 3 (three) times daily.     furosemide  (LASIX ) 40 MG tablet Take 40 mg by mouth at bedtime.     gabapentin  (NEURONTIN ) 400 MG capsule Take 400-800 mg by mouth See admin instructions. Take 800mg  (2 capsules) by mouth every morning and an additional 400mg  (1 capsule) as needed.     guaifenesin (ROBITUSSIN) 100 MG/5ML  syrup Take 200 mg by mouth 3 (three) times daily as needed for cough.     meclizine  (ANTIVERT ) 12.5 MG tablet Take 1 tablet (12.5 mg total) by mouth 3 (three) times daily as needed for dizziness. (Patient taking differently: Take 12.5 mg by mouth at bedtime.) 30 tablet 0   metoprolol  succinate (TOPROL -XL) 50 MG 24 hr tablet Take 50 mg by mouth daily.     montelukast  (SINGULAIR ) 10 MG tablet Take 1 tablet (10 mg total) by mouth at bedtime. 30 tablet 0   nitroGLYCERIN  (NITROSTAT ) 0.3 MG SL tablet Place 0.3 mg under the tongue every 5 (five) minutes  as needed for chest pain.     oxyCODONE -acetaminophen  (PERCOCET/ROXICET) 5-325 MG tablet Take 1 tablet by mouth every 6 (six) hours as needed for severe pain (pain score 7-10). 10 tablet 0   oxymetazoline (AFRIN) 0.05 % nasal spray Place 2-3 sprays into both nostrils daily.     rosuvastatin  (CRESTOR ) 10 MG tablet Take 1 tablet (10 mg total) by mouth daily. 90 tablet 1   valbenazine  (INGREZZA ) 80 MG capsule Take 80 mg by mouth 2 (two) times daily.     Vitamin D, Ergocalciferol, (DRISDOL) 1.25 MG (50000 UNIT) CAPS capsule Take 50,000 Units by mouth once a week.       Discharge Medications: Please see discharge summary for a list of discharge medications.  Relevant Imaging Results:  Relevant Lab Results:   Additional Information SSN:[id number]  Kari JONETTA Daisy, LCSW

## 2023-11-27 NOTE — ED Notes (Signed)
 0130 am meds given by L Leisen, RN using [employer] phone, server error displays

## 2023-11-27 NOTE — Progress Notes (Addendum)
 Pt faxed out. Bed offers pending.   Addend @ 12:57PM CSW attempted to contact pt's niece without success. Unable to leave vm.   Addend @ 1:28PM CSW presented bed offers to pt. Pt accepted [facility]. Kia confirming bed. CSW to initiate auth.   Addend @ 1:58PM Auth pending for [facility].

## 2023-11-27 NOTE — Evaluation (Signed)
 Physical Therapy Evaluation Patient Details Name: MARNI FRANZONI MRN: [id number] DOB: Apr 04, 1946 Today's Date: 11/27/2023  History of Present Illness  TEAIRA CROFT is a 78 y.o. female presents with low back pain. 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 PMH: HTN, CVA with L sided deficits, diabetes, heart attack, GERD, RA  Clinical Impression  Pt admitted with above diagnosis. Pt reports ind at baseline using RW and w/c for in home mobility, reports uses taxis for transportation and door dash for groceries, has CNA 4:30-7:30 Monday through Thursday and 2-4:30 on Fridays who assists with ADLs/IADLs. On eval, pt with pain at mid thoracic back while supine in bed 3/10 intensity, with transitioning to EOB and standing pt reports increase to 6/10 and radiating into R paraspinal muscles. Pt with L sided weakness, hip flexion and knee extension 3/10, active dorsiflexion but lacking ~ 5 deg from neutral ankle position. Pt needing mod-max A for bed mobility and STS transfers, unable to take steps away from bedside. Pt reports using RW or standard walker at baseline, but also wanting to use hemiwalker during eval. Patient will benefit from continued inpatient follow up therapy, <3 hours/day. Pt currently with functional limitations due to the deficits listed below (see PT Problem List). Pt will benefit from acute skilled PT to increase their independence and safety with mobility to allow discharge.           If plan is discharge home, recommend the following: A little help with walking and/or transfers;A little help with bathing/dressing/bathroom;Assistance with cooking/housework;Assist for transportation   Can travel by private vehicle   No    Equipment Recommendations None recommended by PT  Recommendations for Other Services       Functional Status Assessment Patient has had a recent  decline in their functional status and demonstrates the ability to make significant improvements in function in a reasonable and predictable amount of time.     Precautions / Restrictions Precautions Precautions: Fall Recall of Precautions/Restrictions: Intact Restrictions Weight Bearing Restrictions Per Provider Order: No      Mobility  Bed Mobility Overal bed mobility: Needs Assistance Bed Mobility: Supine to Sit, Sit to Supine     Supine to sit: Mod assist, HOB elevated, Used rails Sit to supine: Max assist, HOB elevated, Used rails   General bed mobility comments: mod A to come to sitting EOB, pt inching BLE towards EOB, heaviest assist with trunk despite cues for bedrail use and HOB elevated; max A to return to supine and scoot up in bed wtih bedpad and bed assist    Transfers Overall transfer level: Needs assistance Equipment used: Hemi-walker Transfers: Sit to/from Stand Sit to Stand: Mod assist           General transfer comment: mod A to power to stand with BLE braced against bed, hemi walker in R hand, able to complete 2 reps and take 1 sidestep up towards [facility], unable to take any steps away from bedside    Ambulation/Gait                  Stairs            Wheelchair Mobility     Tilt Bed    Modified Rankin (Stroke Patients Only)       Balance Overall balance assessment: Needs assistance Sitting-balance support: Feet supported, Single extremity supported Sitting balance-Leahy Scale: Fair     Standing balance support:  Reliant on assistive device for balance, During functional activity, Single extremity supported Standing balance-Leahy Scale: Poor                               Pertinent Vitals/Pain Pain Assessment Pain Assessment: 0-10 Pain Score: 6  (3/10 at rest, 6/10 with mobility) Pain Location: mid thoracic, R sided paraspinal muscles Pain Intervention(s): Limited activity within patient's tolerance, Monitored  during session, Premedicated before session, Repositioned    Home Living Family/patient expects to be discharged to:: Private residence Living Arrangements: Alone Available Help at Discharge: Personal care attendant Type of Home: Apartment Home Access: Level entry       Home Layout: One level Home Equipment: Agricultural consultant (2 wheels);Cane - single point;Shower seat;Wheelchair - manual      Prior Function Prior Level of Function : Needs assist;Independent/Modified Independent             Mobility Comments: pt reports ind with transfers and w/c mobility, limited amb in the home ADLs Comments: pt reports ind with sponge bath, toileting and dressing; but also reports has CNA 4:30-7:30 M-T and 2-4:30 F for household chores and self care assist     Extremity/Trunk Assessment   Upper Extremity Assessment Upper Extremity Assessment: Defer to OT evaluation    Lower Extremity Assessment Lower Extremity Assessment: Generalized weakness;LLE deficits/detail LLE Deficits / Details: ankle able to dorsiflex but ~5 deg from neutral, knee extension 3/5, hip flexion 3/5       Communication   Communication Communication: No apparent difficulties    Cognition Arousal: Alert Behavior During Therapy: WFL for tasks assessed/performed   PT - Cognitive impairments: No apparent impairments                       PT - Cognition Comments: pt a&o x4, slow to respond to  questions Following commands: Impaired Following commands impaired: Follows one step commands with increased time     Cueing Cueing Techniques: Verbal cues, Tactile cues     General Comments      Exercises General Exercises - Lower Extremity Long Arc Quad: AROM, Both, 10 reps, Seated Hip Flexion/Marching: AROM, Both, 10 reps, Seated   Assessment/Plan    PT Assessment Patient needs continued PT services  PT Problem List Decreased strength;Decreased activity tolerance;Decreased balance;Decreased  mobility;Decreased knowledge of use of DME;Impaired tone;Obesity;Pain       PT Treatment Interventions DME instruction;Gait training;Functional mobility training;Therapeutic activities;Therapeutic exercise;Balance training;Cognitive remediation;Patient/family education;Wheelchair mobility training    PT Goals (Current goals can be found in the Care Plan section)  Acute Rehab PT Goals Patient Stated Goal: regain strength PT Goal Formulation: With patient Time For Goal Achievement: 12/11/23 Potential to Achieve Goals: Good    Frequency Min 2X/week     Co-evaluation               AM-PAC PT 6 Clicks Mobility  Outcome Measure Help needed turning from your back to your side while in a flat bed without using bedrails?: A Lot Help needed moving from lying on your back to sitting on the side of a flat bed without using bedrails?: A Lot Help needed moving to and from a bed to a chair (including a wheelchair)?: A Lot Help needed standing up from a chair using your arms (e.g., wheelchair or bedside chair)?: A Lot Help needed to walk in hospital room?: Total Help needed climbing 3-5 steps with a railing? :  Total 6 Click Score: 10    End of Session Equipment Utilized During Treatment: Gait belt Activity Tolerance: Patient tolerated treatment well;Patient limited by pain Patient left: in bed;with call bell/phone within reach;with bed alarm set Nurse Communication: Mobility status PT Visit Diagnosis: Unsteadiness on feet (R26.81);Muscle weakness (generalized) (M62.81);Pain Pain - part of body:  (mid thoracic back)    Time: 9073-9042 PT Time Calculation (min) (ACUTE ONLY): 31 min   Charges:   PT Evaluation $PT Eval Moderate Complexity: 1 Mod PT Treatments $Therapeutic Activity: 8-22 mins PT General Charges $$ ACUTE PT VISIT: 1 Visit         Tori Mariska Daffin PT, DPT 11/27/23, 11:02 AM

## 2023-11-28 DIAGNOSIS — M545 Low back pain, unspecified: Secondary | ICD-10-CM | POA: Diagnosis not present

## 2023-11-28 MED ORDER — BUDESON-GLYCOPYRROL-FORMOTEROL 160-9-4.8 MCG/ACT IN AERO
2.0000 | INHALATION_SPRAY | Freq: Every day | RESPIRATORY_TRACT | Status: DC
  Administered 2023-11-28: 2 via RESPIRATORY_TRACT

## 2023-11-28 MED ORDER — PANTOPRAZOLE SODIUM 40 MG PO TBEC
80.0000 mg | DELAYED_RELEASE_TABLET | Freq: Every day | ORAL | Status: DC
  Administered 2023-11-28: 80 mg via ORAL
  Filled 2023-11-28: qty 2

## 2023-11-28 NOTE — ED Notes (Addendum)
 Report given to [facility]. Awaiting PTAR.

## 2023-11-28 NOTE — TOC Progression Note (Addendum)
 Transition of Care [facility]) - Progression Note    Patient Details  Name: [name] MRN: [id number] Date of Birth: [birth year]  [facility] CM/SW Contact  Lorraine LILLETTE Fenton, LCSW Phone Number: 11/28/2023, 9:42 AM  Clinical Narrative:    Pt Auth approved for [facility]. Stanley to MD and RN advising Auth approved. Plan to DC today.  CSW contacted Kia at [facility] to coordinate placement, agreed to call CSW back.  CSW then returned call to  sister Barnie Stank who states she is the mother of niece- concerned about sister.  Shared that sister calls niece most often because she remembers her number. CSW shared that pt expected to DC today to a local skilled facility, agreed to call her back with details.  TOC following.    Addendum: CSW met with pt at bedside, SNF wanted to be sure medication arrived with pt. Pt states she lives in her apt and family can have the key to access her apartment to get her medication. CSW allowed sister and niece to speak to pt on speaker phone.  Pt DC to [facility] and family will meet pt there. No further TOC needs.                 Expected Discharge Plan and Services                                               Social Drivers of Health (SDOH) Interventions SDOH Screenings   Food Insecurity: No Food Insecurity (10/31/2023)  Housing: Low Risk  (10/31/2023)  Transportation Needs: No Transportation Needs (10/31/2023)  Utilities: Not At Risk (10/31/2023)  Social Connections: Socially Isolated (10/31/2023)  Tobacco Use: Medium Risk (11/26/2023)    Readmission Risk Interventions     No data to display

## 2023-11-28 NOTE — ED Provider Notes (Addendum)
 Emergency Medicine Observation Re-evaluation Note  [name] is a 78 y.o. female, seen on rounds today.  Pt initially presented to the ED for complaints of Back Pain Currently, the patient is asleep and resting without acute distress.  Physical Exam  BP 100/80 (BP Location: Right Arm)   Pulse 83   Temp 97.8 F (36.6 C) (Oral)   Resp 20   Ht 5' 1 (1.549 m)   Wt 86.6 kg   SpO2 100%   BMI 36.07 kg/m  Physical Exam General: Resting without distress Cardiac: Not tachycardic unless vital signs Lungs: Symmetric rise and fall of chest wall resting Psych: No agitation at this time  ED Course / MDM  EKG:   I have reviewed the labs performed to date as well as medications administered while in observation.  Recent changes in the last 24 hours include none reported by overnight nursing.  Plan  Current plan is for awaiting social work placement.    Kane Kusek, Lonni PARAS, MD 11/28/23 [phone]  10:12 AM Was informed by social work that patient has found placement and will be discharged to go to the facility.    Tala Eber, Lonni PARAS, MD 11/28/23 1013

## 2023-11-28 NOTE — ED Notes (Signed)
 [name] will come to pick up pt belongings today from registration. Bag is labeled.

## 2023-12-19 ENCOUNTER — Encounter (HOSPITAL_COMMUNITY): Payer: Self-pay

## 2023-12-19 ENCOUNTER — Other Ambulatory Visit: Payer: Self-pay

## 2023-12-19 ENCOUNTER — Inpatient Hospital Stay (HOSPITAL_COMMUNITY)
Admission: EM | Admit: 2023-12-19 | Discharge: 2024-01-01 | DRG: 175 | Disposition: A | Discharge status: Other Acute Inpatient Hospital | Attending: Internal Medicine | Admitting: Internal Medicine

## 2023-12-19 ENCOUNTER — Emergency Department (HOSPITAL_COMMUNITY)

## 2023-12-19 DIAGNOSIS — I82412 Acute embolism and thrombosis of left femoral vein: Secondary | ICD-10-CM | POA: Diagnosis present

## 2023-12-19 DIAGNOSIS — I82461 Acute embolism and thrombosis of right calf muscular vein: Secondary | ICD-10-CM | POA: Diagnosis present

## 2023-12-19 DIAGNOSIS — Z86711 Personal history of pulmonary embolism: Secondary | ICD-10-CM | POA: Diagnosis not present

## 2023-12-19 DIAGNOSIS — Z87891 Personal history of nicotine dependence: Secondary | ICD-10-CM | POA: Diagnosis not present

## 2023-12-19 DIAGNOSIS — E871 Hypo-osmolality and hyponatremia: Secondary | ICD-10-CM | POA: Diagnosis present

## 2023-12-19 DIAGNOSIS — R1084 Generalized abdominal pain: Secondary | ICD-10-CM | POA: Diagnosis present

## 2023-12-19 DIAGNOSIS — E7800 Pure hypercholesterolemia, unspecified: Secondary | ICD-10-CM | POA: Diagnosis present

## 2023-12-19 DIAGNOSIS — I2510 Atherosclerotic heart disease of native coronary artery without angina pectoris: Secondary | ICD-10-CM | POA: Diagnosis present

## 2023-12-19 DIAGNOSIS — M069 Rheumatoid arthritis, unspecified: Secondary | ICD-10-CM | POA: Diagnosis present

## 2023-12-19 DIAGNOSIS — Z7982 Long term (current) use of aspirin: Secondary | ICD-10-CM | POA: Diagnosis not present

## 2023-12-19 DIAGNOSIS — I82413 Acute embolism and thrombosis of femoral vein, bilateral: Secondary | ICD-10-CM | POA: Diagnosis present

## 2023-12-19 DIAGNOSIS — G8929 Other chronic pain: Secondary | ICD-10-CM | POA: Diagnosis present

## 2023-12-19 DIAGNOSIS — I252 Old myocardial infarction: Secondary | ICD-10-CM | POA: Diagnosis not present

## 2023-12-19 DIAGNOSIS — Z6833 Body mass index (BMI) 33.0-33.9, adult: Secondary | ICD-10-CM

## 2023-12-19 DIAGNOSIS — I10 Essential (primary) hypertension: Secondary | ICD-10-CM | POA: Diagnosis present

## 2023-12-19 DIAGNOSIS — I82431 Acute embolism and thrombosis of right popliteal vein: Secondary | ICD-10-CM | POA: Diagnosis present

## 2023-12-19 DIAGNOSIS — E559 Vitamin D deficiency, unspecified: Secondary | ICD-10-CM | POA: Diagnosis present

## 2023-12-19 DIAGNOSIS — I2699 Other pulmonary embolism without acute cor pulmonale: Secondary | ICD-10-CM | POA: Diagnosis not present

## 2023-12-19 DIAGNOSIS — Z90710 Acquired absence of both cervix and uterus: Secondary | ICD-10-CM | POA: Diagnosis not present

## 2023-12-19 DIAGNOSIS — E669 Obesity, unspecified: Secondary | ICD-10-CM | POA: Diagnosis present

## 2023-12-19 DIAGNOSIS — E119 Type 2 diabetes mellitus without complications: Secondary | ICD-10-CM | POA: Diagnosis present

## 2023-12-19 DIAGNOSIS — K219 Gastro-esophageal reflux disease without esophagitis: Secondary | ICD-10-CM | POA: Diagnosis present

## 2023-12-19 DIAGNOSIS — M5126 Other intervertebral disc displacement, lumbar region: Secondary | ICD-10-CM | POA: Diagnosis present

## 2023-12-19 DIAGNOSIS — B379 Candidiasis, unspecified: Secondary | ICD-10-CM | POA: Diagnosis present

## 2023-12-19 DIAGNOSIS — Z7951 Long term (current) use of inhaled steroids: Secondary | ICD-10-CM

## 2023-12-19 DIAGNOSIS — Z8249 Family history of ischemic heart disease and other diseases of the circulatory system: Secondary | ICD-10-CM

## 2023-12-19 DIAGNOSIS — R682 Dry mouth, unspecified: Secondary | ICD-10-CM | POA: Diagnosis present

## 2023-12-19 DIAGNOSIS — Z79899 Other long term (current) drug therapy: Secondary | ICD-10-CM

## 2023-12-19 DIAGNOSIS — R5381 Other malaise: Secondary | ICD-10-CM | POA: Diagnosis present

## 2023-12-19 DIAGNOSIS — I959 Hypotension, unspecified: Secondary | ICD-10-CM | POA: Diagnosis present

## 2023-12-19 DIAGNOSIS — J45909 Unspecified asthma, uncomplicated: Secondary | ICD-10-CM | POA: Diagnosis present

## 2023-12-19 DIAGNOSIS — Z8673 Personal history of transient ischemic attack (TIA), and cerebral infarction without residual deficits: Secondary | ICD-10-CM

## 2023-12-19 DIAGNOSIS — I2609 Other pulmonary embolism with acute cor pulmonale: Principal | ICD-10-CM | POA: Diagnosis present

## 2023-12-19 DIAGNOSIS — R600 Localized edema: Secondary | ICD-10-CM | POA: Diagnosis not present

## 2023-12-19 DIAGNOSIS — N390 Urinary tract infection, site not specified: Secondary | ICD-10-CM | POA: Diagnosis present

## 2023-12-19 DIAGNOSIS — K449 Diaphragmatic hernia without obstruction or gangrene: Secondary | ICD-10-CM | POA: Diagnosis present

## 2023-12-19 DIAGNOSIS — Z604 Social exclusion and rejection: Secondary | ICD-10-CM | POA: Diagnosis present

## 2023-12-19 DIAGNOSIS — R0602 Shortness of breath: Secondary | ICD-10-CM | POA: Diagnosis not present

## 2023-12-19 LAB — HEPATIC FUNCTION PANEL
ALT: 11 U/L (ref 0–44)
AST: 18 U/L (ref 15–41)
Albumin: 3.4 g/dL — ABNORMAL LOW (ref 3.5–5.0)
Alkaline Phosphatase: 64 U/L (ref 38–126)
Bilirubin, Direct: 0.2 mg/dL (ref 0.0–0.2)
Indirect Bilirubin: 1 mg/dL — ABNORMAL HIGH (ref 0.3–0.9)
Total Bilirubin: 1.2 mg/dL (ref 0.0–1.2)
Total Protein: 7.1 g/dL (ref 6.5–8.1)

## 2023-12-19 LAB — BASIC METABOLIC PANEL WITH GFR
Anion gap: 11 (ref 5–15)
BUN: 16 mg/dL (ref 8–23)
CO2: 24 mmol/L (ref 22–32)
Calcium: 9 mg/dL (ref 8.9–10.3)
Chloride: 98 mmol/L (ref 98–111)
Creatinine, Ser: 1.06 mg/dL — ABNORMAL HIGH (ref 0.44–1.00)
GFR, Estimated: 54 mL/min — ABNORMAL LOW (ref >60)
Glucose, Bld: 120 mg/dL — ABNORMAL HIGH (ref 70–99)
Potassium: 3.6 mmol/L (ref 3.5–5.1)
Sodium: 133 mmol/L — ABNORMAL LOW (ref 135–145)

## 2023-12-19 LAB — CBC
HCT: 37 % (ref 36.0–46.0)
Hemoglobin: 13 g/dL (ref 12.0–15.0)
MCH: 30.4 pg (ref 26.0–34.0)
MCHC: 35.1 g/dL (ref 30.0–36.0)
MCV: 86.4 fL (ref 80.0–100.0)
Platelets: 353 K/uL (ref 150–400)
RBC: 4.28 MIL/uL (ref 3.87–5.11)
RDW: 12.7 % (ref 11.5–15.5)
WBC: 9 K/uL (ref 4.0–10.5)
nRBC: 0 % (ref 0.0–0.2)

## 2023-12-19 LAB — LIPASE, BLOOD: Lipase: 30 U/L (ref 11–51)

## 2023-12-19 MED ORDER — HEPARIN SODIUM (PORCINE) 5000 UNIT/ML IJ SOLN: 60.0000 [IU]/kg | Freq: Once | INTRAMUSCULAR | Status: DC

## 2023-12-19 MED ORDER — HEPARIN (PORCINE) 25000 UT/250ML-% IV SOLN: 14.0000 [IU]/kg/h | INTRAVENOUS | Status: DC

## 2023-12-19 MED ORDER — ONDANSETRON HCL 4 MG/2ML IJ SOLN
4.0000 mg | Freq: Four times a day (QID) | INTRAMUSCULAR | Status: DC | PRN
Start: 2023-12-19 — End: 2024-01-01

## 2023-12-19 MED ORDER — SODIUM CHLORIDE 0.9 % IV SOLN: INTRAVENOUS | Status: AC

## 2023-12-19 MED ORDER — HEPARIN (PORCINE) 25000 UT/250ML-% IV SOLN
1250.0000 [IU]/h | INTRAVENOUS | Status: AC
  Administered 2023-12-19 – 2023-12-20 (×3): 1100 [IU]/h via INTRAVENOUS
  Administered 2023-12-21: 1250 [IU]/h via INTRAVENOUS
  Filled 2023-12-19 (×3): qty 250

## 2023-12-19 MED ORDER — SODIUM CHLORIDE 0.9 % IV SOLN: INTRAVENOUS | Status: DC

## 2023-12-19 MED ORDER — HEPARIN BOLUS VIA INFUSION
4000.0000 [IU] | Freq: Once | INTRAVENOUS | Status: AC
  Administered 2023-12-19 (×2): 4000 [IU] via INTRAVENOUS
  Filled 2023-12-19: qty 4000

## 2023-12-19 MED ORDER — ACETAMINOPHEN 325 MG PO TABS
650.0000 mg | ORAL_TABLET | Freq: Four times a day (QID) | ORAL | Status: DC | PRN
Start: 2023-12-19 — End: 2023-12-22
  Administered 2023-12-21: 650 mg via ORAL
  Filled 2023-12-19: qty 2

## 2023-12-19 MED ORDER — ACETAMINOPHEN 650 MG RE SUPP: 650.0000 mg | Freq: Four times a day (QID) | RECTAL | Status: DC | PRN

## 2023-12-19 MED ORDER — ONDANSETRON HCL 4 MG PO TABS: 4.0000 mg | ORAL_TABLET | Freq: Four times a day (QID) | ORAL | Status: DC | PRN

## 2023-12-19 MED ORDER — BUDESON-GLYCOPYRROL-FORMOTEROL 160-9-4.8 MCG/ACT IN AERO
2.0000 | INHALATION_SPRAY | Freq: Every day | RESPIRATORY_TRACT | Status: DC
  Administered 2023-12-20 – 2024-01-01 (×13): 2 via RESPIRATORY_TRACT
  Filled 2023-12-19: qty 5.9

## 2023-12-19 MED ORDER — CHLORHEXIDINE GLUCONATE CLOTH 2 % EX PADS
6.0000 | MEDICATED_PAD | Freq: Every day | CUTANEOUS | Status: DC
  Administered 2023-12-20 – 2024-01-01 (×11): 6 via TOPICAL

## 2023-12-19 MED ORDER — MONTELUKAST SODIUM 10 MG PO TABS
10.0000 mg | ORAL_TABLET | Freq: Every day | ORAL | Status: DC
  Administered 2023-12-20 – 2023-12-31 (×12): 10 mg via ORAL
  Filled 2023-12-19 (×12): qty 1

## 2023-12-19 MED ORDER — ROSUVASTATIN CALCIUM 10 MG PO TABS
10.0000 mg | ORAL_TABLET | Freq: Every day | ORAL | Status: DC
  Administered 2023-12-20 – 2024-01-01 (×13): 10 mg via ORAL
  Filled 2023-12-19 (×13): qty 1

## 2023-12-19 MED ORDER — SODIUM CHLORIDE 0.9 % IV BOLUS
500.0000 mL | Freq: Once | INTRAVENOUS | Status: AC
  Administered 2023-12-19 (×2): 500 mL via INTRAVENOUS

## 2023-12-19 MED ORDER — SENNOSIDES-DOCUSATE SODIUM 8.6-50 MG PO TABS: 1.0000 | ORAL_TABLET | Freq: Every evening | ORAL | Status: DC | PRN

## 2023-12-19 MED ORDER — ALBUTEROL SULFATE HFA 108 (90 BASE) MCG/ACT IN AERS: 2.0000 | INHALATION_SPRAY | Freq: Four times a day (QID) | RESPIRATORY_TRACT | Status: DC | PRN

## 2023-12-19 MED ORDER — LACTATED RINGERS IV SOLN: INTRAVENOUS | Status: DC

## 2023-12-19 MED ORDER — IOHEXOL 300 MG/ML  SOLN
100.0000 mL | Freq: Once | INTRAMUSCULAR | Status: AC | PRN
  Administered 2023-12-19 (×2): 100 mL via INTRAVENOUS

## 2023-12-19 MED ORDER — ALBUTEROL SULFATE (2.5 MG/3ML) 0.083% IN NEBU: 2.5000 mg | INHALATION_SOLUTION | Freq: Four times a day (QID) | RESPIRATORY_TRACT | Status: DC | PRN

## 2023-12-19 NOTE — ED Provider Notes (Addendum)
 [place] EMERGENCY DEPARTMENT AT [facility] Provider Note   CSN: [id number] Arrival date & time: 12/19/23  1839     Patient presents with: Weakness   [name] is a 78 y.o. female.   Patient brought in by EMS for nursing home placement.  Apparently per caretaker is not able to feed or take care of her self at home.  Best I can tell piecing things together patient was admitted June 24 through June 28 discharged to a nursing facility.  Then was seen here again on July 19 was here until placed in the rehab went to [facility].  Not sure when she left rehab or what happened after that.  But the note says patient was released 6 days ago.  Patient here with a complaint abdominal pain says she has been vomiting.  Temp is 98.6 pulse 107 respirations 18 blood pressure 124/80 oxygen saturation is 98%.  Blood sugar was 135.  Patient does not appear clinically dehydrated.  Past medical history seems to be significant for hypertension endometriosis history of a heart attack rheumatoid arthritis diabetes coronary artery disease high cholesterol arthritis and depression past surgical history significant for abdominal hysterectomy left heart catheterization in 2014 not sure if any stents were placed.  Former smoker of cigarettes.  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       Prior to Admission medications   Medication Sig Start Date End Date Taking? Authorizing Provider  albuterol  (VENTOLIN  HFA) 108 (90 Base) MCG/ACT inhaler Inhale 2 puffs into the lungs every 6 (six) hours as needed for wheezing or shortness of breath. 03/09/23   Lorin Norris, MD  amLODipine  (NORVASC ) 5 MG tablet Take 1.5 tablets (7.5 mg total) by mouth daily. Patient taking differently: Take 5 mg by mouth 2 (two) times daily. 09/11/23   Wyn Jackee VEAR Mickey., NP  aspirin  EC 81 MG tablet Take 1 tablet (81 mg total) by mouth daily. Swallow  whole. Patient not taking: Reported on 10/31/2023 07/14/22   Vannie Reche GORMAN, NP  benztropine  (COGENTIN ) 0.5 MG tablet Take 0.5 mg by mouth 2 (two) times daily. 10/12/23   [provider]  Budeson-Glycopyrrol-Formoterol  (BREZTRI  AEROSPHERE) 160-9-4.8 MCG/ACT AERO Inhale 2 puffs into the lungs in the morning and at bedtime. Patient taking differently: Inhale 2 puffs into the lungs daily. 03/09/23   Lorin Norris, MD  buPROPion  (WELLBUTRIN  XL) 150 MG 24 hr tablet Take 150 mg by mouth at bedtime. 03/22/22   [provider]  diazepam  (VALIUM ) 2 MG tablet Take 1 tablet (2 mg total) by mouth every 6 (six) hours as needed for muscle spasms. 11/24/23   Dasie Faden, MD  esomeprazole  (NEXIUM ) 40 MG capsule Take 1 capsule (40 mg total) by mouth daily at 12 noon. 07/31/22   Walker, Caitlin S, NP  fluPHENAZine  (PROLIXIN ) 5 MG tablet Take 5 mg by mouth 3 (three) times daily.    [provider]  furosemide  (LASIX ) 40 MG tablet Take 40 mg by mouth at bedtime.    [provider]  gabapentin  (NEURONTIN ) 400 MG capsule Take 400-800 mg by mouth See admin instructions. Take 800mg  (2 capsules) by mouth every morning and an additional 400mg  (1 capsule) as needed. 05/07/22   [provider]  guaifenesin (ROBITUSSIN) 100 MG/5ML syrup Take 200 mg by mouth 3 (three) times daily as needed for cough.    [provider]  meclizine  (ANTIVERT ) 12.5 MG tablet Take 1 tablet (12.5 mg total) by mouth 3 (three) times daily as needed for dizziness. Patient taking differently: Take 12.5 mg by mouth at bedtime. 11/12/20   Laurice Maude BROCKS, MD  metoprolol  succinate (TOPROL -XL) 50 MG 24 hr tablet Take 50 mg by mouth daily. 10/30/23   [provider]  montelukast  (SINGULAIR ) 10 MG tablet Take 1 tablet (10 mg total) by mouth at bedtime. 01/12/23   Lorin Norris, MD  nitroGLYCERIN  (NITROSTAT ) 0.3 MG SL tablet Place 0.3 mg under the tongue every 5 (five) minutes as needed for chest  pain. 12/17/19   [provider]  oxyCODONE -acetaminophen  (PERCOCET/ROXICET) 5-325 MG tablet Take 1 tablet by mouth every 6 (six) hours as needed for severe pain (pain score 7-10). 11/24/23   Dasie Faden, MD  oxymetazoline (AFRIN) 0.05 % nasal spray Place 2-3 sprays into both nostrils daily.    [provider]  rosuvastatin  (CRESTOR ) 10 MG tablet Take 1 tablet (10 mg total) by mouth daily. 09/11/23 09/05/24  Wyn Jackee VEAR Mickey., NP  valbenazine  (INGREZZA ) 80 MG capsule Take 80 mg by mouth 2 (two) times daily.    [provider]  Vitamin D, Ergocalciferol, (DRISDOL) 1.25 MG (50000 UNIT) CAPS capsule Take 50,000 Units by mouth once a week. 05/07/22   [provider]    Allergies: Patient has no known allergies.    Review of Systems  Gastrointestinal:  Positive for abdominal pain, nausea and vomiting.  Neurological:  Positive for weakness.    Updated Vital Signs BP 124/80 (BP Location: Right Arm)   Pulse (!) 107   Temp 98.6 F (37 C) (Oral)   Resp 18   SpO2 98%   Physical Exam Vitals and nursing note reviewed.  Constitutional:      General: She is not in acute distress.    Appearance: Normal appearance. She is well-developed.  HENT:     Head: Normocephalic and atraumatic.     Mouth/Throat:     Mouth: Mucous membranes are dry.  Eyes:     Extraocular Movements: Extraocular movements intact.     Conjunctiva/sclera: Conjunctivae normal.     Pupils: Pupils are equal, round, and reactive to light.  Cardiovascular:     Rate and Rhythm: Normal rate and regular rhythm.     Heart sounds: No murmur heard. Pulmonary:     Effort: Pulmonary effort is normal. No respiratory distress.     Breath sounds: Normal breath sounds.  Abdominal:     Palpations: Abdomen is soft.     Tenderness: There is abdominal tenderness. There is no guarding.  Musculoskeletal:        General: No swelling.     Cervical back: Normal range of motion and neck supple.     Right lower  leg: No edema.     Left lower leg: No edema.  Skin:    General: Skin is warm and dry.     Capillary Refill: Capillary refill takes less than 2 seconds.  Neurological:     General: No focal deficit present.     Mental Status: She is alert and oriented to person, place, and time.  Psychiatric:        Mood and Affect: Mood normal.     (all labs ordered are listed, but only abnormal results are displayed) Labs Reviewed  BASIC METABOLIC PANEL WITH GFR - Abnormal; Notable for the following components:      Result Value   Sodium 133 (*)  Glucose, Bld 120 (*)    Creatinine, Ser 1.06 (*)    GFR, Estimated 54 (*)    All other components within normal limits  HEPATIC FUNCTION PANEL - Abnormal; Notable for the following components:   Albumin 3.4 (*)    Indirect Bilirubin 1.0 (*)    All other components within normal limits  CBC  LIPASE, BLOOD  URINALYSIS, ROUTINE W REFLEX MICROSCOPIC    EKG: None  Radiology: CT ABDOMEN PELVIS W CONTRAST Addendum Date: 12/19/2023 ADDENDUM REPORT: 12/19/2023 21:00 ADDENDUM: These results were called by telephone at the time of interpretation on 12/19/2023 at 9:00 pm to provider Reaghan Kawa , who verbally acknowledged these results. Electronically Signed   By: Dorethia Molt M.D.   On: 12/19/2023 21:00   Result Date: 12/19/2023 CLINICAL DATA:  Acute nonlocalized abdominal pain, generalized weakness EXAM: CT ABDOMEN AND PELVIS WITH CONTRAST TECHNIQUE: Multidetector CT imaging of the abdomen and pelvis was performed using the standard protocol following bolus administration of intravenous contrast. RADIATION DOSE REDUCTION: This exam was performed according to the departmental dose-optimization program which includes automated exposure control, adjustment of the mA and/or kV according to patient size and/or use of iterative reconstruction technique. CONTRAST:  OMNIPAQUE  IOHEXOL  300 MG/ML  SOLN COMPARISON:  None Available. FINDINGS: Lower chest: The  mid images of the thorax demonstrate an intraluminal central filling defect within the left main pulmonary artery peripherally extending into the lower lobar pulmonary arteries compatible with an acute to subacute pulmonary embolism. The central pulmonary arteries are enlarged in keeping with changes of pulmonary arterial hypertension. There is CT evidence of right heart strain with relative shift of the intraventricular septum to the left (15/3) which is new from prior examination of 06/08/2022. Visualized lung bases are clear. Small hiatal hernia. Hepatobiliary: No focal liver abnormality is seen. No gallstones, gallbladder wall thickening, or biliary dilatation. Pancreas: Unremarkable Spleen: Unremarkable Adrenals/Urinary Tract: The adrenal glands are unremarkable. The kidneys are normal in size and position. No hydronephrosis. No intrarenal or ureteral calculi. The bladder is mildly distended. There is hyperemia the bladder wall and subtle perivesicular inflammatory stranding which suggests an underlying infectious or inflammatory cystitis. Stomach/Bowel: Stomach is within normal limits. Appendix appears normal. No evidence of bowel wall thickening, distention, or inflammatory changes. Vascular/Lymphatic: There is expansile intraluminal filling defect within the left common femoral vein (76/3) in keeping with probable acute DVT. The abdominal vasculature is otherwise unremarkable. No pathologic adenopathy within the abdomen and pelvis. Reproductive: Status post hysterectomy. No adnexal masses. Other: No abdominal wall hernia or abnormality. No abdominopelvic ascites. Musculoskeletal: No acute bone abnormality. No lytic or blastic bone lesion. Osseous structures are age appropriate. IMPRESSION: 1. Acute to subacute pulmonary embolism within the left main pulmonary artery peripherally extending into the lower lobar pulmonary arteries. CT evidence of right heart strain with relative shift of the intraventricular  septum to the left. 2. Acute DVT within the left common femoral vein. 3. Hyperemia of the bladder wall and subtle perivesicular inflammatory stranding suggesting an underlying infectious or inflammatory cystitis. Correlation with urinalysis and urine culture may be helpful. 4. Small hiatal hernia. Electronically Signed: By: Dorethia Molt M.D. On: 12/19/2023 20:56   CT Head Wo Contrast Result Date: 12/19/2023 CLINICAL DATA:  Altered mental status EXAM: CT HEAD WITHOUT CONTRAST TECHNIQUE: Contiguous axial images were obtained from the base of the skull through the vertex without intravenous contrast. RADIATION DOSE REDUCTION: This exam was performed according to the departmental dose-optimization program which includes automated exposure  control, adjustment of the mA and/or kV according to patient size and/or use of iterative reconstruction technique. COMPARISON:  None Available. FINDINGS: Brain: Normal anatomic configuration. No abnormal intra or extra-axial mass lesion or fluid collection. No abnormal mass effect or midline shift. No evidence of acute intracranial hemorrhage or infarct. Ventricular size is normal. Cerebellum unremarkable. Vascular: Unremarkable Skull: Intact Sinuses/Orbits: Paranasal sinuses are clear. Orbits are unremarkable. Other: Mastoid air cells and middle ear cavities are clear. IMPRESSION: 1. No acute intracranial abnormality. Normal examination. Electronically Signed   By: Dorethia Molt M.D.   On: 12/19/2023 21:00   DG Chest Port 1 View Result Date: 12/19/2023 CLINICAL DATA:  Abdominal pain EXAM: PORTABLE CHEST - 1 VIEW COMPARISON:  October 30, 2023 FINDINGS: Low lung volumes with elevation of the right hemidiaphragm. No focal airspace consolidation, pleural effusion, or pneumothorax. No cardiomegaly.Aortic atherosclerosis. Osteopenia. No acute fracture or destructive lesion. Mild scoliosis of the thoracic spine with multilevel degenerative disc disease. IMPRESSION: Low lung volumes  with left basilar atelectasis. Electronically Signed   By: Rogelia Myers M.D.   On: 12/19/2023 20:28     Procedures   Medications Ordered in the ED  0.9 %  sodium chloride  infusion (has no administration in time range)  sodium chloride  0.9 % bolus 500 mL (500 mLs Intravenous Bolus 12/19/23 2042)  iohexol  (OMNIPAQUE ) 300 MG/ML solution 100 mL (100 mLs Intravenous Contrast Given 12/19/23 2022)                                    Medical Decision Making Amount and/or Complexity of Data Reviewed Labs: ordered. Radiology: ordered.  Risk Prescription drug management. Decision regarding hospitalization.  Patient seems to be very drowsy.  Complaining abdominal pain says she had nausea vomiting.  Mucous membranes are dry.  Basic metabolic panel sodium 133 glucose 120 GFR 54 so we should be able to do a CT scan of the abdomen pelvis.  CBC white count 9 hemoglobin 13 platelets 353.  Will get chest x-ray will get hepatic function and lipase.  Will get CT head CT abdomen and pelvis.  Patient needs a medical workup before we can determine whether she is clear for facility placement or not.  CT scan with surprising results shows an acute to subacute pulmonary embolism left main pulmonary artery extending into the left lower lobe pulmonary arteries CT evidence of right heart strain with relative shift of intra versus ventricular septum to the left acute DVT within the left common femoral.  Hyperemia of the bladder wall and subtle Abran vesicular inflammatory stranding suggesting an underlying urinary tract infection.  Hepatic function panel total bili was 1.2 and direct was 1.  Lipase 30 LFTs without significant abnormalities albumin down a little bit at 3.4.  CBC no leukocytosis white count 9 hemoglobin 13 platelets 353.  Basic metabolic panel sodium at 133 did mention this up there before GFR is 54.   Based on these findings will contact critical care.    Head CT without any acute findings.   Chest x-ray without acute findings.  I discussed with the critical care specialist.  Recommending hospitalist admission will start on heparin .  CRITICAL CARE Performed by: Franklin Baumbach Total critical care time: 45 minutes Critical care time was exclusive of separately billable procedures and treating other patients. Critical care was necessary to treat or prevent imminent or life-threatening deterioration. Critical care was time spent personally by me  on the following activities: development of treatment plan with patient and/or surrogate as well as nursing, discussions with consultants, evaluation of patient's response to treatment, examination of patient, obtaining history from patient or surrogate, ordering and performing treatments and interventions, ordering and review of laboratory studies, ordering and review of radiographic studies, pulse oximetry and re-evaluation of patient's condition.     Final diagnoses:  Generalized abdominal pain  Acute pulmonary embolism with acute cor pulmonale, unspecified pulmonary embolism type [facility])    ED Discharge Orders     None          Geraldene Hamilton, MD 12/19/23 2009    Geraldene Hamilton, MD 12/19/23 [id number]    Geraldene Hamilton, MD 12/19/23 2134

## 2023-12-19 NOTE — ED Triage Notes (Signed)
 Pt BIB EMS from home for weakness. Per caretaker, pt is not able to feed or take of herself at home. Pt was released from rehab six days ago and had generalized weakness as well.  110/70 102 hr 18rr 96% RA 135 cbg

## 2023-12-19 NOTE — Hospital Course (Signed)
 [name] is a 78 y.o. female with medical history significant for CVA, HTN, HLD, asthma, GERD, RA who is admitted with acute PE/DVT.

## 2023-12-19 NOTE — H&P (Signed)
 History and Physical    [name] FMW:[id number] DOB: Jun 03, 1945 DOA: 12/19/2023  PCP: Rosalea Rosina SAILOR, PA  Patient coming from: Home  I have personally briefly reviewed patient's old medical records in [facility]  Chief Complaint: Abdominal pain, shortness of breath  HPI: [name] is a 78 y.o. female with medical history significant for CVA, nonobstructive CAD, HTN, HLD, asthma, GERD, RA who presented to the ED for evaluation of abdominal pain and shortness of breath.  Patient presenting with feeling of pain all over.  Patient states pain is primarily in the right abdominal region.  She has been having central chest pain as well.  On further questioning she reports shortness of breath.  She has associated cough mostly productive of white sputum but sometimes she sees small spots of blood.  She has seen some mild swelling in her legs.  Patient states that she is having some burning sensation with urination.  Patient recently admitted 6/24-6/28 for generalized weakness felt due to physical deconditioning/chronic debility.  Imaging was without evidence of acute abnormality in the spine, cord compression, or high-grade stenosis.  It did mention left foraminal to extraforaminal disc protrusion at L2-3 potentially affecting the exiting left L2 nerve root.  Patient was ultimately discharged to SNF per PT/OT recommendation.  After discharge from SNF to home patient was again seen in ED on 7/21 with acute on chronic back pain interfering with mobility and ADLs.  She boarded in the ED and was discharged to SNF on 7/23.  Patient returned to home about 6 days ago.  She says she lives alone and usually ambulates with use of a cane.  ED Course  Labs/Imaging on admission: I have personally reviewed following labs and imaging studies.  Initial vitals showed BP 124/80, pulse 107, RR 18, temp 98.6 F, SpO2 98% on room air.  Labs showed sodium 133, potassium 3.6, bicarb 24, BUN 16,  creatinine 1.06, serum glucose 120, lipase 30, WBC 9.0, hemoglobin 13.0, platelets 353.  Portable chest x-ray showed low lung volumes with left basilar atelectasis.  No focal consolidation, edema, effusion.  CT head without contrast negative for acute intracranial abnormality.  CT abdomen/pelvis with contrast showed acute to subacute PE within the left main pulmonary artery peripherally extending into the lower lobar pulmonary arteries.  CT evidence of right heart strain with relative shift of the intraventricular septum to the left.  Acute DVT within the left common femoral vein.  Hyperemia of the bladder wall and subtle perivesicular inflammatory stranding suggesting underlying infectious or inflammatory cystitis.  Small hiatal hernia noted.  Patient was given 500 cc normal saline and started on IV heparin .  EDP discussed with on-call PCCM who recommended medical admission and they will see in consultation.  The hospitalist service was consulted to admit.  Review of Systems: All systems reviewed and are negative except as documented in history of present illness above.   Past Medical History:  Diagnosis Date   Allergy    Arthritis    Asthma    CAD (coronary artery disease)    Chronic headaches    Depression    Diabetes mellitus, type II (HCC)    Diverticulosis    Endometriosis    GERD (gastroesophageal reflux disease)    Heart attack (HCC)    High cholesterol    Hypertension    Mallory-Weiss tear 2003   Mood swings    Rheumatoid arthritis (HCC)    Vitamin D deficiency     Past  Surgical History:  Procedure Laterality Date   ABDOMINAL HYSTERECTOMY     EXPLORATORY LAPAROTOMY     I & D EXTREMITY Left 08/04/2017   Procedure: IRRIGATION AND DEBRIDEMENT HAND;  Surgeon: Murrell Drivers, MD;  Location: [facility] OR;  Service: Orthopedics;  Laterality: Left;   LEFT HEART CATHETERIZATION WITH CORONARY ANGIOGRAM N/A 04/10/2013   Procedure: LEFT HEART CATHETERIZATION WITH CORONARY ANGIOGRAM;   Surgeon: Salena GORMAN Negri, MD;  Location: [facility] CATH LAB;  Service: Cardiovascular;  Laterality: N/A;    Social History: Social History   Tobacco Use   Smoking status: Former    Types: Cigarettes   Smokeless tobacco: Never  Vaping Use   Vaping status: Never Used  Substance Use Topics   Alcohol use: Yes    Comment: occasional   Drug use: No   No Known Allergies  Family History  Problem Relation Age of Onset   Cancer Mother    Heart disease Mother    Cancer Father    Heart disease Father    Colon cancer Neg Hx    Rectal cancer Neg Hx    Stomach cancer Neg Hx      Prior to Admission medications   Medication Sig Start Date End Date Taking? Authorizing Provider  albuterol  (VENTOLIN  HFA) 108 (90 Base) MCG/ACT inhaler Inhale 2 puffs into the lungs every 6 (six) hours as needed for wheezing or shortness of breath. 03/09/23   Lorin Norris, MD  amLODipine  (NORVASC ) 5 MG tablet Take 1.5 tablets (7.5 mg total) by mouth daily. Patient taking differently: Take 5 mg by mouth 2 (two) times daily. 09/11/23   Wyn Jackee VEAR Mickey., NP  aspirin  EC 81 MG tablet Take 1 tablet (81 mg total) by mouth daily. Swallow whole. Patient not taking: Reported on 10/31/2023 07/14/22   Vannie Reche GORMAN, NP  benztropine  (COGENTIN ) 0.5 MG tablet Take 0.5 mg by mouth 2 (two) times daily. 10/12/23   [provider]  Budeson-Glycopyrrol-Formoterol  (BREZTRI  AEROSPHERE) 160-9-4.8 MCG/ACT AERO Inhale 2 puffs into the lungs in the morning and at bedtime. Patient taking differently: Inhale 2 puffs into the lungs daily. 03/09/23   Lorin Norris, MD  buPROPion  (WELLBUTRIN  XL) 150 MG 24 hr tablet Take 150 mg by mouth at bedtime. 03/22/22   [provider]  diazepam  (VALIUM ) 2 MG tablet Take 1 tablet (2 mg total) by mouth every 6 (six) hours as needed for muscle spasms. 11/24/23   Dasie Faden, MD  esomeprazole  (NEXIUM ) 40 MG capsule Take 1 capsule (40 mg total) by mouth daily at 12 noon. 07/31/22   Walker,  Caitlin S, NP  fluPHENAZine  (PROLIXIN ) 5 MG tablet Take 5 mg by mouth 3 (three) times daily.    [provider]  furosemide  (LASIX ) 40 MG tablet Take 40 mg by mouth at bedtime.    [provider]  gabapentin  (NEURONTIN ) 400 MG capsule Take 400-800 mg by mouth See admin instructions. Take 800mg  (2 capsules) by mouth every morning and an additional 400mg  (1 capsule) as needed. 05/07/22   [provider]  guaifenesin (ROBITUSSIN) 100 MG/5ML syrup Take 200 mg by mouth 3 (three) times daily as needed for cough.    [provider]  meclizine  (ANTIVERT ) 12.5 MG tablet Take 1 tablet (12.5 mg total) by mouth 3 (three) times daily as needed for dizziness. Patient taking differently: Take 12.5 mg by mouth at bedtime. 11/12/20   Laurice Maude BROCKS, MD  metoprolol  succinate (TOPROL -XL) 50 MG 24 hr tablet Take 50 mg by mouth  daily. 10/30/23   [provider]  montelukast  (SINGULAIR ) 10 MG tablet Take 1 tablet (10 mg total) by mouth at bedtime. 01/12/23   Lorin Norris, MD  nitroGLYCERIN  (NITROSTAT ) 0.3 MG SL tablet Place 0.3 mg under the tongue every 5 (five) minutes as needed for chest pain. 12/17/19   [provider]  oxyCODONE -acetaminophen  (PERCOCET/ROXICET) 5-325 MG tablet Take 1 tablet by mouth every 6 (six) hours as needed for severe pain (pain score 7-10). 11/24/23   Dasie Faden, MD  oxymetazoline (AFRIN) 0.05 % nasal spray Place 2-3 sprays into both nostrils daily.    [provider]  rosuvastatin  (CRESTOR ) 10 MG tablet Take 1 tablet (10 mg total) by mouth daily. 09/11/23 09/05/24  Wyn Jackee VEAR Mickey., NP  valbenazine  (INGREZZA ) 80 MG capsule Take 80 mg by mouth 2 (two) times daily.    [provider]  Vitamin D, Ergocalciferol, (DRISDOL) 1.25 MG (50000 UNIT) CAPS capsule Take 50,000 Units by mouth once a week. 05/07/22   [provider]    Physical Exam: Vitals:   12/19/23 1955 12/19/23 2200 12/19/23 2208 12/19/23 2246  BP:   110/86    Pulse:  (!) 101    Resp:  (!) 24    Temp:    98.5 F (36.9 C)  TempSrc:    Oral  SpO2: 98% (!) 87%    Weight:   86.9 kg    Constitutional: Resting in bed, NAD, calm, comfortable Eyes: EOMI, lids and conjunctivae normal ENMT: Mucous membranes are moist. Posterior pharynx clear of any exudate or lesions.Normal dentition.  Neck: normal, supple, no masses. Respiratory: clear to auscultation bilaterally, no wheezing, no crackles. Normal respiratory effort. No accessory muscle use.  Cardiovascular: Regular rate and rhythm, no murmurs / rubs / gallops.  Trace bilateral lower extremity edema. 2+ pedal pulses. Abdomen: no tenderness, no masses palpated. Musculoskeletal: no clubbing / cyanosis.  Chronic deformity of left hand. Good ROM, no contractures. Normal muscle tone.  Skin: no rashes, lesions, ulcers. No induration Neurologic: Sensation intact. Strength 5/5 in all 4.  Psychiatric: Normal judgment and insight. Alert and oriented x 3. Normal mood.   EKG: Personally reviewed. Sinus tachycardia, rate 109, motion artifact.  Rate is faster when compared to previous.  Assessment/Plan Principal Problem:   Acute pulmonary embolism (HCC) Active Problems:   Acute deep vein thrombosis (DVT) of femoral vein of left lower extremity (HCC)   Essential hypertension   Asthma   [name] is a 78 y.o. female with medical history significant for CVA, HTN, HLD, asthma, GERD, RA who is admitted with acute PE/DVT.  Assessment and Plan: Acute to subacute pulmonary embolism: CT imaging showed acute to subacute PE within left main pulmonary artery extending into lower lobar pulmonary arteries.  There was CT evidence of right heart strain.  Also noted to have acute DVT within the left common femoral vein. - Continue IV heparin  - Obtain echocardiogram - PCCM to consult  Abnormal bladder appearance on CT: Imaging showed hyperemia of the bladder wall and subtle perivesicular inflammatory  stranding.  Patient does report some dysuria. - Obtain urinalysis  CAD History of CVA Hyperlipidemia: Hold aspirin  now that she is on IV heparin .  Continue rosuvastatin .  Hypertension: Holding amlodipine , metoprolol , Lasix  given soft blood pressures.  Start gentle IV fluid hydration tonight.  Asthma: Continue Breztri , Singulair , albuterol  as needed.  Mood disorder?: Med rec is pending.  Previous med list included bupropion , fluphenazine , Ingrezza , and Cogentin .  No prior documentation regarding these  meds.  Follow medication reconciliation.   DVT prophylaxis: IV heparin  Code Status: Full code, confirmed with patient on admission Family Communication: Discussed with patient, she has discussed with family Disposition Plan: From home, dispo pending clinical progress Consults called: PCCM Severity of Illness: The appropriate patient status for this patient is INPATIENT. Inpatient status is judged to be reasonable and necessary in order to provide the required intensity of service to ensure the patient's safety. The patient's presenting symptoms, physical exam findings, and initial radiographic and laboratory data in the context of their chronic comorbidities is felt to place them at high risk for further clinical deterioration. Furthermore, it is not anticipated that the patient will be medically stable for discharge from the hospital within 2 midnights of admission.   * I certify that at the point of admission it is my clinical judgment that the patient will require inpatient hospital care spanning beyond 2 midnights from the point of admission due to high intensity of service, high risk for further deterioration and high frequency of surveillance required.DEWAINE Jorie Blanch MD [facility]  If 7PM-7AM, please contact night-coverage [email or web address]  12/19/2023, 10:48 PM

## 2023-12-19 NOTE — Progress Notes (Signed)
 eLink Physician-Brief Progress Note Patient Name: [name] DOB: October 17, 1945 MRN: [id number]   Date of Service  12/19/2023  HPI/Events of Note  89F with hx CVA, asthma, RA, CAD, HTN, HLD who p/w abdominal pain/chest pain/shortness of breath. Immobile due to acute on chronic back pain. CT A/P obtained for abdominal pain demonstraed incidental acute/subacute PE in left main pulm A extending to lower lobar pulmonary arteries. Acute DVT in left common femoral vein also seen. PCCM consulted for evaluation.  Currently HDS with SpO2 96% on room air and last BP 119/99.  eICU Interventions  Continue IV heparin  Formal consult in am If acute decompensation, primary team to contact CCM pager for urgent evaluation     Intervention Category Evaluation Type: New Patient Evaluation  [name] Staff 12/19/2023, 11:44 PM

## 2023-12-19 NOTE — Progress Notes (Signed)
 PHARMACY - ANTICOAGULATION CONSULT NOTE  Pharmacy Consult for Heparin  Indication:  pulmonary embolus w/ right heart strain & acute DVT in left common femoral vein  No Known Allergies  Patient Measurements:   Height = 61 Weight (11/26/23) = 86.6 kg Heparin  dosing weight = 67 kg  Vital Signs: Temp: 98.6 F (37 C) (08/13 1847) Temp Source: Oral (08/13 1847) BP: 124/80 (08/13 1847) Pulse Rate: 107 (08/13 1847)  Labs: Recent Labs    12/19/23 1906  HGB 13.0  HCT 37.0  PLT 353  CREATININE 1.06*    CrCl cannot be calculated (Unknown ideal weight.).   Medical History: Past Medical History:  Diagnosis Date   Allergy    Arthritis    Asthma    CAD (coronary artery disease)    Chronic headaches    Depression    Diabetes mellitus, type II (HCC)    Diverticulosis    Endometriosis    GERD (gastroesophageal reflux disease)    Heart attack (HCC)    High cholesterol    Hypertension    Mallory-Weiss tear 2003   Mood swings    Rheumatoid arthritis (HCC)    Vitamin D deficiency     Medications:  No oral anticoagulation PTA  Assessment: 78 yr female brought to ED for nursing home placement with c/o weakness.   CTAngio + acute to subacute PE with evidence of right heart strain, + acute DVT in left common femoral vein  Goal of Therapy:  Heparin  level 0.3-0.7 units/ml Monitor platelets by anticoagulation protocol: Yes   Plan:  Baseline aPTT and PT/INR Heparin  4000 unit IV bolus x 1 Heparin  gtt @ 1100 units/hr Check heparin  level 8 hr after infusion started Daily heparin  level & CBC Confirm weight once pt weighed on floor  Orphia Mctigue, Arvin Fletcher, PharmD 12/19/2023,9:46 PM

## 2023-12-19 NOTE — ED Notes (Signed)
 EDP Zackowski  into see [name] who initially would not wake up but in a short time pt was responsive to questions and exam was completed.

## 2023-12-20 ENCOUNTER — Other Ambulatory Visit (HOSPITAL_COMMUNITY): Payer: Self-pay

## 2023-12-20 ENCOUNTER — Inpatient Hospital Stay (HOSPITAL_COMMUNITY)

## 2023-12-20 ENCOUNTER — Telehealth (HOSPITAL_COMMUNITY): Payer: Self-pay | Admitting: Pharmacy Technician

## 2023-12-20 DIAGNOSIS — Z86711 Personal history of pulmonary embolism: Secondary | ICD-10-CM | POA: Diagnosis not present

## 2023-12-20 DIAGNOSIS — I2699 Other pulmonary embolism without acute cor pulmonale: Secondary | ICD-10-CM | POA: Diagnosis not present

## 2023-12-20 DIAGNOSIS — I2609 Other pulmonary embolism with acute cor pulmonale: Secondary | ICD-10-CM | POA: Diagnosis not present

## 2023-12-20 DIAGNOSIS — N390 Urinary tract infection, site not specified: Secondary | ICD-10-CM

## 2023-12-20 DIAGNOSIS — I82412 Acute embolism and thrombosis of left femoral vein: Secondary | ICD-10-CM | POA: Diagnosis not present

## 2023-12-20 DIAGNOSIS — R0602 Shortness of breath: Secondary | ICD-10-CM | POA: Diagnosis not present

## 2023-12-20 LAB — CBC
HCT: 36.2 % (ref 36.0–46.0)
Hemoglobin: 13 g/dL (ref 12.0–15.0)
MCH: 31.2 pg (ref 26.0–34.0)
MCHC: 35.9 g/dL (ref 30.0–36.0)
MCV: 86.8 fL (ref 80.0–100.0)
Platelets: 342 K/uL (ref 150–400)
RBC: 4.17 MIL/uL (ref 3.87–5.11)
RDW: 12.9 % (ref 11.5–15.5)
WBC: 8.9 K/uL (ref 4.0–10.5)
nRBC: 0 % (ref 0.0–0.2)

## 2023-12-20 LAB — URINALYSIS, ROUTINE W REFLEX MICROSCOPIC
Bacteria, UA: NONE SEEN
Bilirubin Urine: NEGATIVE
Glucose, UA: NEGATIVE mg/dL
Ketones, ur: 20 mg/dL — AB
Nitrite: NEGATIVE
Protein, ur: 100 mg/dL — AB
Specific Gravity, Urine: >1.046 — ABNORMAL HIGH (ref 1.005–1.030)
WBC, UA: >50 WBC/hpf (ref 0–5)
pH: 5 (ref 5.0–8.0)

## 2023-12-20 LAB — HEPARIN LEVEL (UNFRACTIONATED)
Heparin Unfractionated: 0.6 [IU]/mL (ref 0.30–0.70)
Heparin Unfractionated: 0.39 [IU]/mL (ref 0.30–0.70)
Heparin Unfractionated: 0.28 [IU]/mL — ABNORMAL LOW (ref 0.30–0.70)

## 2023-12-20 LAB — ECHOCARDIOGRAM COMPLETE
Area-P 1/2: 4.15 cm2
Height: 61 in
S' Lateral: 2.8 cm
Weight: 2691.38 [oz_av]

## 2023-12-20 LAB — BASIC METABOLIC PANEL WITH GFR
Anion gap: 17 — ABNORMAL HIGH (ref 5–15)
BUN: 15 mg/dL (ref 8–23)
CO2: 21 mmol/L — ABNORMAL LOW (ref 22–32)
Calcium: 9.4 mg/dL (ref 8.9–10.3)
Chloride: 97 mmol/L — ABNORMAL LOW (ref 98–111)
Creatinine, Ser: 0.97 mg/dL (ref 0.44–1.00)
GFR, Estimated: >60 mL/min (ref >60)
Glucose, Bld: 109 mg/dL — ABNORMAL HIGH (ref 70–99)
Potassium: 3.7 mmol/L (ref 3.5–5.1)
Sodium: 135 mmol/L (ref 135–145)

## 2023-12-20 LAB — APTT: aPTT: 84 s — ABNORMAL HIGH (ref 24–36)

## 2023-12-20 LAB — TROPONIN I (HIGH SENSITIVITY)
Troponin I (High Sensitivity): 7 ng/L (ref <18)
Troponin I (High Sensitivity): 19 ng/L — ABNORMAL HIGH (ref <18)

## 2023-12-20 LAB — PROTIME-INR
INR: 1.2 (ref 0.8–1.2)
Prothrombin Time: 16 s — ABNORMAL HIGH (ref 11.4–15.2)

## 2023-12-20 LAB — MRSA NEXT GEN BY PCR, NASAL: MRSA by PCR Next Gen: NOT DETECTED

## 2023-12-20 LAB — BRAIN NATRIURETIC PEPTIDE: B Natriuretic Peptide: 26.6 pg/mL (ref 0.0–100.0)

## 2023-12-20 LAB — LACTIC ACID, PLASMA: Lactic Acid, Venous: 0.8 mmol/L (ref 0.5–1.9)

## 2023-12-20 MED ORDER — SODIUM CHLORIDE 0.9% FLUSH: 10.0000 mL | INTRAVENOUS | Status: DC | PRN

## 2023-12-20 MED ORDER — SODIUM CHLORIDE 0.9% FLUSH
10.0000 mL | Freq: Two times a day (BID) | INTRAVENOUS | Status: DC
  Administered 2023-12-20: 10 mL
  Administered 2023-12-21: 20 mL
  Administered 2023-12-21 – 2024-01-01 (×22): 10 mL

## 2023-12-20 MED ORDER — SENNOSIDES-DOCUSATE SODIUM 8.6-50 MG PO TABS
2.0000 | ORAL_TABLET | Freq: Two times a day (BID) | ORAL | Status: DC
  Administered 2023-12-20 – 2023-12-31 (×17): 2 via ORAL
  Filled 2023-12-20 (×21): qty 2

## 2023-12-20 MED ORDER — BUPROPION HCL ER (XL) 150 MG PO TB24
150.0000 mg | ORAL_TABLET | Freq: Every day | ORAL | Status: DC
  Administered 2023-12-20 – 2024-01-01 (×13): 150 mg via ORAL
  Filled 2023-12-20 (×13): qty 1

## 2023-12-20 MED ORDER — BENZTROPINE MESYLATE 0.5 MG PO TABS
0.5000 mg | ORAL_TABLET | Freq: Two times a day (BID) | ORAL | Status: DC
  Administered 2023-12-20 – 2024-01-01 (×25): 0.5 mg via ORAL
  Filled 2023-12-20 (×25): qty 1

## 2023-12-20 MED ORDER — PERFLUTREN LIPID MICROSPHERE
1.0000 mL | INTRAVENOUS | Status: AC | PRN
  Administered 2023-12-20: 5 mL via INTRAVENOUS

## 2023-12-20 MED ORDER — FLUCONAZOLE 100 MG PO TABS
200.0000 mg | ORAL_TABLET | Freq: Once | ORAL | Status: AC
  Administered 2023-12-20: 200 mg via ORAL
  Filled 2023-12-20: qty 2

## 2023-12-20 MED ORDER — BISACODYL 5 MG PO TBEC
10.0000 mg | DELAYED_RELEASE_TABLET | Freq: Every day | ORAL | Status: DC | PRN
  Administered 2023-12-21: 10 mg via ORAL
  Filled 2023-12-20: qty 2

## 2023-12-20 MED ORDER — POLYETHYLENE GLYCOL 3350 17 G PO PACK
17.0000 g | PACK | Freq: Every day | ORAL | Status: DC
  Administered 2023-12-20: 17 g via ORAL
  Filled 2023-12-20: qty 1

## 2023-12-20 MED ORDER — POLYETHYLENE GLYCOL 3350 17 G PO PACK
17.0000 g | PACK | Freq: Two times a day (BID) | ORAL | Status: DC
  Administered 2023-12-20 – 2023-12-31 (×16): 17 g via ORAL
  Filled 2023-12-20 (×20): qty 1

## 2023-12-20 MED ORDER — SODIUM CHLORIDE 0.9 % IV BOLUS
500.0000 mL | Freq: Once | INTRAVENOUS | Status: AC
  Administered 2023-12-20: 500 mL via INTRAVENOUS

## 2023-12-20 MED ORDER — SODIUM CHLORIDE 0.9 % IV SOLN
1.0000 g | INTRAVENOUS | Status: DC
  Administered 2023-12-20 – 2023-12-21 (×2): 1 g via INTRAVENOUS
  Filled 2023-12-20 (×3): qty 10

## 2023-12-20 MED ORDER — FLUPHENAZINE HCL 5 MG PO TABS
5.0000 mg | ORAL_TABLET | Freq: Three times a day (TID) | ORAL | Status: DC
  Administered 2023-12-20 – 2024-01-01 (×34): 5 mg via ORAL
  Filled 2023-12-20 (×39): qty 1

## 2023-12-20 MED ORDER — ORAL CARE MOUTH RINSE
15.0000 mL | OROMUCOSAL | Status: DC | PRN
Start: 2023-12-20 — End: 2024-01-01

## 2023-12-20 MED ORDER — FLUCONAZOLE 100 MG PO TABS: 200.0000 mg | ORAL_TABLET | Freq: Every day | ORAL | Status: DC

## 2023-12-20 MED ORDER — FLEET ENEMA RE ENEM
1.0000 | ENEMA | Freq: Every day | RECTAL | Status: DC | PRN
  Administered 2023-12-22: 1 via RECTAL
  Filled 2023-12-20: qty 1

## 2023-12-20 NOTE — Progress Notes (Signed)
 Pharmacy: Re- heparin   Patient is a 78 y.o F who is currently on heparin  drip for VTE treatment.  - confirmatory heparin  level collected at 3pm is therapeutic at 0.39 - no bleeding documented   Goal of Therapy:  Heparin  level 0.3-0.7 units/ml Monitor platelets by anticoagulation protocol: Yes  Plan: - continue heparin  drip at 1100 units/hr - With extensive VTE, will recheck another heparin  level at 9pm to ensure level remains therapeutic and is not continuing to trend down. - monitor for s/sx bleeding   Iantha Batch, PharmD, BCPS 12/20/2023 3:34 PM

## 2023-12-20 NOTE — Progress Notes (Signed)
 Bilateral lower extremity venous duplex has been completed. Preliminary results can be found in CV Proc through chart review.  Results were given to Charmaine PEAK, and Barrister's clerk.  12/20/23 1:58 PM Cathlyn Collet RVT

## 2023-12-20 NOTE — Progress Notes (Signed)
 PHARMACY - ANTICOAGULATION CONSULT NOTE  Pharmacy Consult for Heparin  Indication:  pulmonary embolus w/ right heart strain & acute DVT in left common femoral vein  No Known Allergies  Patient Measurements: Height: 5' 1 (154.9 cm) Weight: 76.3 kg (168 lb 3.4 oz) IBW/kg (Calculated) : 47.8 HEPARIN  DW (KG): 64.7 Height = 61 Weight (11/26/23) = 86.6 kg, initially documented as the same here then documented as 76.3 kg Heparin  dosing weight = 67 kg  Vital Signs: Temp: 99.6 F (37.6 C) (08/14 0400) Temp Source: Axillary (08/14 0400) BP: 102/69 (08/14 0400) Pulse Rate: 98 (08/14 0323)  Labs: Recent Labs    12/19/23 1906 12/20/23 0017 12/20/23 0638  HGB 13.0 13.0  --   HCT 37.0 36.2  --   PLT 353 342  --   APTT  --  84*  --   LABPROT  --  16.0*  --   INR  --  1.2  --   HEPARINUNFRC  --   --  0.60  CREATININE 1.06* 0.97  --     Estimated Creatinine Clearance: 46.1 mL/min (by C-G formula based on SCr of 0.97 mg/dL).   Medical History: Past Medical History:  Diagnosis Date   Allergy    Arthritis    Asthma    CAD (coronary artery disease)    Chronic headaches    Depression    Diabetes mellitus, type II (HCC)    Diverticulosis    Endometriosis    GERD (gastroesophageal reflux disease)    Heart attack (HCC)    High cholesterol    Hypertension    Mallory-Weiss tear 2003   Mood swings    Rheumatoid arthritis (HCC)    Vitamin D deficiency     Medications:  No oral anticoagulation PTA  Assessment: 78 yr female brought to ED for nursing home placement with c/o weakness. During medical workup to clear for facility placement, CTA with acute to subacute PE with evidence of right heart strain, and also found to have acute DVT in left common femoral vein. Pharmacy was consulted for heparin  management.  Today: -Heparin  level 0.60 - therapeutic with heparin  infusing at 1100 units/hr -CBC stable -No complications of therapy noted  Goal of Therapy:  Heparin  level  0.3-0.7 units/ml Monitor platelets by anticoagulation protocol: Yes   Plan:  -Continue heparin  infusion at 1100 units/hr -Repeat 8 hour heparin  level to confirm -Daily heparin  level and CBC -F/u for transition to oral anticoagulant   Stefano MARLA Bologna, PharmD, BCPS Clinical Pharmacist 12/20/2023 7:31 AM

## 2023-12-20 NOTE — Plan of Care (Signed)

## 2023-12-20 NOTE — Progress Notes (Signed)
 Pharmacy: Re- heparin   Patient is a 78 y.o F who is currently on heparin  drip for VTE treatment.  - heparin  level collected at 10pm is now SUBtherapeutic at 0.28 - no bleeding or infusion related concerns reported by RN  Goal of Therapy:  Heparin  level 0.3-0.7 units/ml Monitor platelets by anticoagulation protocol: Yes  Plan: - Increase heparin  drip to 1250 units/hr - Repeat heparin  level in 8h - monitor for s/sx bleeding   Rosaline Millet, PharmD, BCPS 12/20/2023 11:33 PM

## 2023-12-20 NOTE — Consult Note (Addendum)
 NAME:  [name], MRN:  [id number], DOB:  Feb 23, 1946, LOS: 1 ADMISSION DATE:  12/19/2023, CONSULTATION DATE:  12/20/2023 REFERRING MD:  Dr. Verdene, CHIEF COMPLAINT:  PE   History of Present Illness:   Ms. Player is a 78 year old female with PMH of CVA, HTN, HLD, asthma, GERD and RA who presented to the ED 8/13 for evaluation of dyspnea and abdominal pain. Of note, patient was recently admitted 6/24-6/28 with generalized weakness felt to be due to deconditioning/chronic debility. Patient was discharged to a SNF and ultimately sent home. On 7/21 she was seen again in the ED for acute on chronic back pain interfering with mobility. She was boarded in the ED and sent to a SNF on 7/23. She returned home not long before representing to the ED.   In the ED a CT abdomen pelvis demonstrated acute to subacute PE within the left main pulmonary artery peripherally extending into the lower lobar pulmonary arteries with CT evidence of right heart strain with shift of the intraventricular septum to the left and acute DVT within the left common femoral vein. She was started on a heparin  drip and PCCM was consulted for assistance with management/risk stratification.   On evaluation she is breathing comfortably on room air and hemodynamically stable. Patient is a poor historian. She tells me she has had chest and leg pain for 2-3 years. She currently has 6/10 burning lower chest pain. It is constant and not relieved or aggravated by anything. She endorses some shortness of breath but thinks it is mostly the chest pain. She denies worsening pain or dyspnea with deep breaths. She tells me she is mostly immobile and just recently left a facility.   Pertinent  Medical History  CVA HTN HLD  Asthma GERD RA Chronic back pain Decreased mobility   Significant Hospital Events: Including procedures, antibiotic start and stop dates in addition to other pertinent events   12/19/2023: Presented to ED with dyspnea  and abdominal pain found to have acute to subacute PE on CT imaging. Started on heparin  drip and PCCM consulted  Interim History / Subjective:  CT AP acute to subacute PE within the left main pulmonary artery peripherally extending into the lower lobar pulmonary arteries with CT evidence of right heart strain with shift of the intraventricular septum to the left and acute DVT within the left common femoral vein. Started on a heparin  drip and PCCM consulted.  Cr appears at baseline CBC unremarkable  Objective    Blood pressure 114/71, pulse (!) 114, temperature (!) 97.4 F (36.3 C), temperature source Oral, resp. rate 20, height 5' 1 (1.549 m), weight 76.3 kg, SpO2 97%.        Intake/Output Summary (Last 24 hours) at 12/20/2023 0930 Last data filed at 12/20/2023 0330 Gross per 24 hour  Intake --  Output 250 ml  Net -250 ml   Filed Weights   12/19/23 2208 12/19/23 2339  Weight: 86.9 kg 76.3 kg    Examination: General: chronically ill appearing, no acute distress HENT: temporal muscle wasting Lungs: breathing comfortably on room air, clear to auscultation bilaterally Cardiovascular: regular rate and rhythm, normal S1 and S2 no murmurs, rubs or gallops Abdomen: soft, nontender, nondistended, normal bowel sounds Extremities: warm, palpable pedal pulses Neuro: answers questions appropriately GU: deferred  Resolved problem list   Assessment and Plan   Acute to subacute PE, likely provoked in the setting of immobility Dyspnea Left common femoral vein DVT Patient presented with dyspnea and  abdominal pain found filling defects consistent with PE in left main PA extending to lower lobar pulmonary arteries on CT AP with IV contrast. Also noted to have evidence of RV strain with relative shift of the intraventricular septum to the left and acute DVT in left common femoral vein. PESI score 76, class II low risk. Normal cardiac biomarkers and lactic acid.   Plan: - Follow-up Echo to  evaluate RV  - Order BLE US  - Recommend continuing anticoagulation with heparin  gtt - In the event patient clinically worsens, would recommend dedicated CT PE protocol - PCCM will follow-up Echo results peripherally and be available as needed  Best Practice (right click and Reselect all SmartList Selections daily)   Diet/type: Regular consistency (see orders) DVT prophylaxis systemic heparin  Pressure ulcer(s): N/A GI prophylaxis: N/A Lines: N/A Foley:  N/A Code Status:  full code  Labs   CBC: Recent Labs  Lab 12/19/23 1906 12/20/23 0017  WBC 9.0 8.9  HGB 13.0 13.0  HCT 37.0 36.2  MCV 86.4 86.8  PLT 353 342    Basic Metabolic Panel: Recent Labs  Lab 12/19/23 1906 12/20/23 0017  NA 133* 135  K 3.6 3.7  CL 98 97*  CO2 24 21*  GLUCOSE 120* 109*  BUN 16 15  CREATININE 1.06* 0.97  CALCIUM  9.0 9.4   GFR: Estimated Creatinine Clearance: 46.1 mL/min (by C-G formula based on SCr of 0.97 mg/dL). Recent Labs  Lab 12/19/23 1906 12/20/23 0017 12/20/23 0822  WBC 9.0 8.9  --   LATICACIDVEN  --   --  0.8    Liver Function Tests: Recent Labs  Lab 12/19/23 1906  AST 18  ALT 11  ALKPHOS 64  BILITOT 1.2  PROT 7.1  ALBUMIN 3.4*   Recent Labs  Lab 12/19/23 1906  LIPASE 30   No results for input(s): AMMONIA in the last 168 hours.  ABG    Component Value Date/Time   TCO2 24 11/12/2020 0908     Coagulation Profile: Recent Labs  Lab 12/20/23 0017  INR 1.2    Cardiac Enzymes: No results for input(s): CKTOTAL, CKMB, CKMBINDEX, TROPONINI in the last 168 hours.  HbA1C: Hgb A1c MFr Bld  Date/Time Value Ref Range Status  07/03/2022 09:23 AM 5.3 4.8 - 5.6 % Final    Comment:             Prediabetes: 5.7 - 6.4          Diabetes: >6.4          Glycemic control for adults with diabetes: <7.0     CBG: No results for input(s): GLUCAP in the last 168 hours.  Review of Systems:    Review of Systems  Respiratory:  Positive for shortness of  breath. Negative for cough.   Cardiovascular:  Positive for chest pain. Negative for palpitations.  Gastrointestinal:  Positive for abdominal pain. Negative for blood in stool and melena.  Neurological:  Positive for weakness.   Past Medical History:  She,  has a past medical history of Allergy, Arthritis, Asthma, CAD (coronary artery disease), Chronic headaches, Depression, Diabetes mellitus, type II (HCC), Diverticulosis, Endometriosis, GERD (gastroesophageal reflux disease), Heart attack (HCC), High cholesterol, Hypertension, Mallory-Weiss tear (2003), Mood swings, Rheumatoid arthritis (HCC), and Vitamin D deficiency.   Surgical History:   Past Surgical History:  Procedure Laterality Date   ABDOMINAL HYSTERECTOMY     EXPLORATORY LAPAROTOMY     I & D EXTREMITY Left 08/04/2017   Procedure: IRRIGATION AND DEBRIDEMENT HAND;  Surgeon: Murrell Drivers, MD;  Location: [facility];  Service: Orthopedics;  Laterality: Left;   LEFT HEART CATHETERIZATION WITH CORONARY ANGIOGRAM N/A 04/10/2013   Procedure: LEFT HEART CATHETERIZATION WITH CORONARY ANGIOGRAM;  Surgeon: Salena GORMAN Negri, MD;  Location: [facility] CATH LAB;  Service: Cardiovascular;  Laterality: N/A;     Social History:   reports that she has quit smoking. Her smoking use included cigarettes. She has never used smokeless tobacco. She reports current alcohol use. She reports that she does not use drugs.   Family History:  Her family history includes Cancer in her father and mother; Heart disease in her father and mother. There is no history of Colon cancer, Rectal cancer, or Stomach cancer.   Allergies No Known Allergies   Home Medications  Prior to Admission medications   Medication Sig Start Date End Date Taking? Authorizing Provider  atorvastatin  (LIPITOR) 20 MG tablet Take 20 mg by mouth daily. 12/18/23  Yes [provider]  omeprazole  (PRILOSEC) 20 MG capsule Take 20 mg by mouth daily. 12/18/23  Yes [provider]  TRELEGY  ELLIPTA 100-62.5-25 MCG/ACT AEPB Inhale 1 puff into the lungs daily. 12/18/23  Yes [provider]  albuterol  (VENTOLIN  HFA) 108 (90 Base) MCG/ACT inhaler Inhale 2 puffs into the lungs every 6 (six) hours as needed for wheezing or shortness of breath. 03/09/23   Lorin Norris, MD  amLODipine  (NORVASC ) 5 MG tablet Take 1.5 tablets (7.5 mg total) by mouth daily. Patient taking differently: Take 5 mg by mouth 2 (two) times daily. 09/11/23   Wyn Jackee VEAR Mickey., NP  aspirin  EC 81 MG tablet Take 1 tablet (81 mg total) by mouth daily. Swallow whole. Patient not taking: Reported on 10/31/2023 07/14/22   Vannie Reche GORMAN, NP  benztropine  (COGENTIN ) 0.5 MG tablet Take 0.5 mg by mouth 2 (two) times daily. 10/12/23   [provider]  Budeson-Glycopyrrol-Formoterol  (BREZTRI  AEROSPHERE) 160-9-4.8 MCG/ACT AERO Inhale 2 puffs into the lungs in the morning and at bedtime. Patient taking differently: Inhale 2 puffs into the lungs daily. 03/09/23   Lorin Norris, MD  buPROPion  (WELLBUTRIN  XL) 150 MG 24 hr tablet Take 150 mg by mouth at bedtime. 03/22/22   [provider]  diazepam  (VALIUM ) 2 MG tablet Take 1 tablet (2 mg total) by mouth every 6 (six) hours as needed for muscle spasms. 11/24/23   Dasie Faden, MD  esomeprazole  (NEXIUM ) 40 MG capsule Take 1 capsule (40 mg total) by mouth daily at 12 noon. 07/31/22   Vannie Reche GORMAN, NP  fluPHENAZine  (PROLIXIN ) 5 MG tablet Take 5 mg by mouth 3 (three) times daily.    [provider]  furosemide  (LASIX ) 40 MG tablet Take 40 mg by mouth at bedtime.    [provider]  gabapentin  (NEURONTIN ) 400 MG capsule Take 400-800 mg by mouth See admin instructions. Take 800mg  (2 capsules) by mouth every morning and an additional 400mg  (1 capsule) as needed. 05/07/22   [provider]  guaifenesin (ROBITUSSIN) 100 MG/5ML syrup Take 200 mg by mouth 3 (three) times daily as needed for cough.    [provider]  meclizine   (ANTIVERT ) 12.5 MG tablet Take 1 tablet (12.5 mg total) by mouth 3 (three) times daily as needed for dizziness. Patient taking differently: Take 12.5 mg by mouth at bedtime. 11/12/20   Laurice Maude BROCKS, MD  metoprolol  succinate (TOPROL -XL) 50 MG 24 hr tablet Take 50 mg by mouth daily. 10/30/23   [provider]  montelukast  (SINGULAIR ) 10  MG tablet Take 1 tablet (10 mg total) by mouth at bedtime. 01/12/23   Lorin Norris, MD  nitroGLYCERIN  (NITROSTAT ) 0.3 MG SL tablet Place 0.3 mg under the tongue every 5 (five) minutes as needed for chest pain. 12/17/19   [provider]  oxyCODONE -acetaminophen  (PERCOCET/ROXICET) 5-325 MG tablet Take 1 tablet by mouth every 6 (six) hours as needed for severe pain (pain score 7-10). 11/24/23   Dasie Faden, MD  oxymetazoline (AFRIN) 0.05 % nasal spray Place 2-3 sprays into both nostrils daily.    [provider]  rosuvastatin  (CRESTOR ) 10 MG tablet Take 1 tablet (10 mg total) by mouth daily. 09/11/23 09/05/24  Wyn Jackee VEAR Mickey., NP  valbenazine  (INGREZZA ) 80 MG capsule Take 80 mg by mouth 2 (two) times daily.    [provider]  Vitamin D, Ergocalciferol, (DRISDOL) 1.25 MG (50000 UNIT) CAPS capsule Take 50,000 Units by mouth once a week. 05/07/22   [provider]     Rexene LOISE Blush, PA-C [facility] 12/20/23 9:30 AM  Please see [email or web address] for pager details.

## 2023-12-20 NOTE — Progress Notes (Signed)
 Pt stated upon admission to stepdown unit that she did not want her family notified that she was in the hospital. Will pass on to AM RN to inform family of her arrival.

## 2023-12-20 NOTE — Plan of Care (Signed)
  Problem: Clinical Measurements: Goal: Ability to maintain clinical measurements within normal limits will improve Outcome: Progressing Goal: Will remain free from infection Outcome: Progressing Goal: Diagnostic test results will improve Outcome: Progressing Goal: Cardiovascular complication will be avoided Outcome: Progressing   Problem: Clinical Measurements: Goal: Respiratory complications will improve Outcome: Not Progressing

## 2023-12-20 NOTE — Progress Notes (Signed)

## 2023-12-20 NOTE — Telephone Encounter (Signed)
 Patient Product/process development scientist completed.    The patient is insured through [facility]. Patient has Medicare and is not eligible for a copay card, but may be able to apply for patient assistance or Medicare RX Payment Plan (Patient Must reach out to their plan, if eligible for payment plan), if available.    Ran test claim for Eliquis  5 mg and the current 30 day co-pay is $0.00.  Ran test claim for Xarelto 20 mg and the current 30 day co-pay is $0.00.  This test claim was processed through [facility]- copay amounts may vary at other pharmacies due to pharmacy/plan contracts, or as the patient moves through the different stages of their insurance plan.     [name], CPHT Pharmacy Technician III Certified Patient Advocate [facility] Patient Advocate Team Direct Number: [phone]  Fax: [phone]

## 2023-12-20 NOTE — Progress Notes (Signed)
 [facility] PROGRESS NOTE   [name] FMW:[id number] DOB: 01-25-1946 DOA: 12/19/2023  PCP: Rosalea Rosina SAILOR, PA  Brief History: 78 y.o. female with medical history significant for CVA, nonobstructive CAD, HTN, HLD, asthma, GERD, RA who presented to the ED for evaluation of abdominal pain and shortness of breath. Patient states that she is having some burning sensation with urination. Patient recently admitted 6/24-6/28 for generalized weakness felt due to physical deconditioning/chronic debility.  Imaging was without evidence of acute abnormality in the spine, cord compression, or high-grade stenosis.  It did mention left foraminal to extraforaminal disc protrusion at L2-3 potentially affecting the exiting left L2 nerve root.  Patient was ultimately discharged to SNF per PT/OT recommendation. After discharge from SNF to home patient was again seen in ED on 7/21 with acute on chronic back pain interfering with mobility and ADLs.  She boarded in the ED and was discharged to SNF on 7/23.  Patient returned to home about 6 days prior to this admission.  She says she lives alone and usually ambulates with use of a cane.  Evaluation in the ED reviewed acute pulmonary embolism.  She was hospitalized for further management.  Consultants: Pulmonology  Procedures: Echocardiogram is pending.  Lower extremity Doppler studies pending.    Subjective/Interval History: Patient denies any chest pain this morning.  Complains of feeling weak and fatigued.  Complains of her usual back pain.  Has been able to ambulate at home using a cane.  Her mobility is still very poor.  She is mostly sedentary.    Assessment/Plan:  Acute versus subacute pulmonary embolism/acute DVT The only risk factor identified is sedentary lifestyle for the last several weeks due to her back issue.  Patient denies any history of cancer.  No recent travel. DVT was identified in the left common femoral vein on the CT.  Will  proceed with Doppler studies. Echocardiogram has been ordered. Pulmonology consulted.  May need referral to hematology in the outpatient setting. Continue with IV heparin  for now.  Plan to transition to oral anticoagulants in the next 24 hours.  Acute UTI Bladder appeared to be abnormal on CT scan suggesting UTI.  Patient mentioned dysuria.  UA is noted to be abnormal.  Continue with ceftriaxone .  She was given Diflucan  due to presence of yeast on the UA.  Follow-up on cultures.  History of coronary artery disease/history of stroke/hyperlipidemia Aspirin  on hold.  Continue statin.  Chronic back pain PT and OT eval when more stable.  Essential hypertension Holding amlodipine  metoprolol  and furosemide  due to soft blood pressures.  History of asthma Continue home medications.  Obesity Estimated body mass index is 31.78 kg/m as calculated from the following:   Height as of this encounter: 5' 1 (1.549 m).   Weight as of this encounter: 76.3 kg.   DVT Prophylaxis: IV heparin  Code Status: Full code Family Communication: Discussed with patient Disposition Plan: To be determined  Status is: Inpatient Remains inpatient appropriate because: Acute pulmonary embolism      Medications: Scheduled:  budesonide -glycopyrrolate -formoterol   2 puff Inhalation Daily   Chlorhexidine  Gluconate Cloth  6 each Topical Daily   fluconazole   200 mg Oral Once   montelukast   10 mg Oral QHS   polyethylene glycol  17 g Oral Daily   rosuvastatin   10 mg Oral Daily   Continuous:  sodium chloride  100 mL/hr at 12/20/23 0029   cefTRIAXone  (ROCEPHIN )  IV     heparin  1,100 Units/hr (12/19/23 2232)  PRN:acetaminophen  **OR** acetaminophen , albuterol , ondansetron  **OR** ondansetron  (ZOFRAN ) IV, mouth rinse, perflutren  lipid microspheres (DEFINITY ) IV suspension, senna-docusate  Antibiotics: Anti-infectives (From admission, onward)    Start     Dose/Rate Route Frequency Ordered Stop   12/20/23 1015   fluconazole  (DIFLUCAN ) tablet 200 mg        200 mg Oral  Once 12/20/23 0926     12/20/23 1000  fluconazole  (DIFLUCAN ) tablet 200 mg  Status:  Discontinued        200 mg Oral Daily 12/20/23 0512 12/20/23 0926   12/20/23 1000  cefTRIAXone  (ROCEPHIN ) 1 g in sodium chloride  0.9 % 100 mL IVPB        1 g 200 mL/hr over 30 Minutes Intravenous Every 24 hours 12/20/23 0916         Objective:  Vital Signs  Vitals:   12/20/23 0400 12/20/23 0600 12/20/23 0700 12/20/23 0800  BP: 102/69 (!) 129/108 106/70 114/71  Pulse:    (!) 114  Resp: (!) 23 (!) 21 (!) 27 20  Temp: 99.6 F (37.6 C)   (!) 97.4 F (36.3 C)  TempSrc: Axillary   Oral  SpO2: 95%   97%  Weight:      Height:        Intake/Output Summary (Last 24 hours) at 12/20/2023 0936 Last data filed at 12/20/2023 0330 Gross per 24 hour  Intake --  Output 250 ml  Net -250 ml   Filed Weights   12/19/23 2208 12/19/23 2339  Weight: 86.9 kg 76.3 kg    General appearance: Awake alert.  In no distress Resp: Clear to auscultation bilaterally.  Normal effort Cardio: S1-S2 is normal regular.  No S3-S4.  No rubs murmurs or bruit GI: Abdomen is soft.  Nontender nondistended.  Bowel sounds are present normal.  No masses organomegaly Extremities: No edema.  Full range of motion of lower extremities. Neurologic: Alert and oriented x3.  No focal neurological deficits.    Lab Results:  Data Reviewed: I have personally reviewed following labs and reports of the imaging studies  CBC: Recent Labs  Lab 12/19/23 1906 12/20/23 0017  WBC 9.0 8.9  HGB 13.0 13.0  HCT 37.0 36.2  MCV 86.4 86.8  PLT 353 342    Basic Metabolic Panel: Recent Labs  Lab 12/19/23 1906 12/20/23 0017  NA 133* 135  K 3.6 3.7  CL 98 97*  CO2 24 21*  GLUCOSE 120* 109*  BUN 16 15  CREATININE 1.06* 0.97  CALCIUM  9.0 9.4    GFR: Estimated Creatinine Clearance: 46.1 mL/min (by C-G formula based on SCr of 0.97 mg/dL).  Liver Function Tests: Recent Labs  Lab  12/19/23 1906  AST 18  ALT 11  ALKPHOS 64  BILITOT 1.2  PROT 7.1  ALBUMIN 3.4*    Recent Labs  Lab 12/19/23 1906  LIPASE 30    Coagulation Profile: Recent Labs  Lab 12/20/23 0017  INR 1.2    Recent Results (from the past 240 hours)  MRSA Next Gen by PCR, Nasal     Status: None   Collection Time: 12/19/23 10:59 PM   Specimen: Nasal Mucosa; Nasal Swab  Result Value Ref Range Status   MRSA by PCR Next Gen NOT DETECTED NOT DETECTED Final    Comment: (NOTE) The GeneXpert MRSA Assay (FDA approved for NASAL specimens only), is one component of a comprehensive MRSA colonization surveillance program. It is not intended to diagnose MRSA infection nor to guide or monitor treatment for MRSA infections. Test  performance is not FDA approved in patients less than 72 years old. Performed at [facility], [address] [place], [place] [address]       Radiology Studies: CT ABDOMEN PELVIS W CONTRAST Addendum Date: 12/19/2023 ADDENDUM REPORT: 12/19/2023 21:00 ADDENDUM: These results were called by telephone at the time of interpretation on 12/19/2023 at 9:00 pm to provider SCOTT ZACKOWSKI , who verbally acknowledged these results. Electronically Signed   By: Dorethia Molt M.D.   On: 12/19/2023 21:00   Result Date: 12/19/2023 CLINICAL DATA:  Acute nonlocalized abdominal pain, generalized weakness EXAM: CT ABDOMEN AND PELVIS WITH CONTRAST TECHNIQUE: Multidetector CT imaging of the abdomen and pelvis was performed using the standard protocol following bolus administration of intravenous contrast. RADIATION DOSE REDUCTION: This exam was performed according to the departmental dose-optimization program which includes automated exposure control, adjustment of the mA and/or kV according to patient size and/or use of iterative reconstruction technique. CONTRAST:  OMNIPAQUE  IOHEXOL  300 MG/ML  SOLN COMPARISON:  None Available. FINDINGS: Lower chest: The mid images of the  thorax demonstrate an intraluminal central filling defect within the left main pulmonary artery peripherally extending into the lower lobar pulmonary arteries compatible with an acute to subacute pulmonary embolism. The central pulmonary arteries are enlarged in keeping with changes of pulmonary arterial hypertension. There is CT evidence of right heart strain with relative shift of the intraventricular septum to the left (15/3) which is new from prior examination of 06/08/2022. Visualized lung bases are clear. Small hiatal hernia. Hepatobiliary: No focal liver abnormality is seen. No gallstones, gallbladder wall thickening, or biliary dilatation. Pancreas: Unremarkable Spleen: Unremarkable Adrenals/Urinary Tract: The adrenal glands are unremarkable. The kidneys are normal in size and position. No hydronephrosis. No intrarenal or ureteral calculi. The bladder is mildly distended. There is hyperemia the bladder wall and subtle perivesicular inflammatory stranding which suggests an underlying infectious or inflammatory cystitis. Stomach/Bowel: Stomach is within normal limits. Appendix appears normal. No evidence of bowel wall thickening, distention, or inflammatory changes. Vascular/Lymphatic: There is expansile intraluminal filling defect within the left common femoral vein (76/3) in keeping with probable acute DVT. The abdominal vasculature is otherwise unremarkable. No pathologic adenopathy within the abdomen and pelvis. Reproductive: Status post hysterectomy. No adnexal masses. Other: No abdominal wall hernia or abnormality. No abdominopelvic ascites. Musculoskeletal: No acute bone abnormality. No lytic or blastic bone lesion. Osseous structures are age appropriate. IMPRESSION: 1. Acute to subacute pulmonary embolism within the left main pulmonary artery peripherally extending into the lower lobar pulmonary arteries. CT evidence of right heart strain with relative shift of the intraventricular septum to the left.  2. Acute DVT within the left common femoral vein. 3. Hyperemia of the bladder wall and subtle perivesicular inflammatory stranding suggesting an underlying infectious or inflammatory cystitis. Correlation with urinalysis and urine culture may be helpful. 4. Small hiatal hernia. Electronically Signed: By: Dorethia Molt M.D. On: 12/19/2023 20:56   CT Head Wo Contrast Result Date: 12/19/2023 CLINICAL DATA:  Altered mental status EXAM: CT HEAD WITHOUT CONTRAST TECHNIQUE: Contiguous axial images were obtained from the base of the skull through the vertex without intravenous contrast. RADIATION DOSE REDUCTION: This exam was performed according to the departmental dose-optimization program which includes automated exposure control, adjustment of the mA and/or kV according to patient size and/or use of iterative reconstruction technique. COMPARISON:  None Available. FINDINGS: Brain: Normal anatomic configuration. No abnormal intra or extra-axial mass lesion or fluid collection. No abnormal mass effect or midline shift. No  evidence of acute intracranial hemorrhage or infarct. Ventricular size is normal. Cerebellum unremarkable. Vascular: Unremarkable Skull: Intact Sinuses/Orbits: Paranasal sinuses are clear. Orbits are unremarkable. Other: Mastoid air cells and middle ear cavities are clear. IMPRESSION: 1. No acute intracranial abnormality. Normal examination. Electronically Signed   By: Dorethia Molt M.D.   On: 12/19/2023 21:00   DG Chest Port 1 View Result Date: 12/19/2023 CLINICAL DATA:  Abdominal pain EXAM: PORTABLE CHEST - 1 VIEW COMPARISON:  October 30, 2023 FINDINGS: Low lung volumes with elevation of the right hemidiaphragm. No focal airspace consolidation, pleural effusion, or pneumothorax. No cardiomegaly.Aortic atherosclerosis. Osteopenia. No acute fracture or destructive lesion. Mild scoliosis of the thoracic spine with multilevel degenerative disc disease. IMPRESSION: Low lung volumes with left basilar  atelectasis. Electronically Signed   By: Rogelia Myers M.D.   On: 12/19/2023 20:28       LOS: 1 day   Joette Pebbles  [facility] Pager on [email or web address]  12/20/2023, 9:36 AM

## 2023-12-21 ENCOUNTER — Inpatient Hospital Stay (HOSPITAL_COMMUNITY)

## 2023-12-21 DIAGNOSIS — I82431 Acute embolism and thrombosis of right popliteal vein: Secondary | ICD-10-CM

## 2023-12-21 DIAGNOSIS — I2699 Other pulmonary embolism without acute cor pulmonale: Secondary | ICD-10-CM

## 2023-12-21 DIAGNOSIS — I2609 Other pulmonary embolism with acute cor pulmonale: Secondary | ICD-10-CM | POA: Diagnosis not present

## 2023-12-21 DIAGNOSIS — I82412 Acute embolism and thrombosis of left femoral vein: Secondary | ICD-10-CM | POA: Diagnosis not present

## 2023-12-21 DIAGNOSIS — I82413 Acute embolism and thrombosis of femoral vein, bilateral: Secondary | ICD-10-CM

## 2023-12-21 DIAGNOSIS — N390 Urinary tract infection, site not specified: Secondary | ICD-10-CM | POA: Diagnosis not present

## 2023-12-21 DIAGNOSIS — R600 Localized edema: Secondary | ICD-10-CM

## 2023-12-21 LAB — BASIC METABOLIC PANEL WITH GFR
Anion gap: 10 (ref 5–15)
BUN: 9 mg/dL (ref 8–23)
CO2: 21 mmol/L — ABNORMAL LOW (ref 22–32)
Calcium: 8.2 mg/dL — ABNORMAL LOW (ref 8.9–10.3)
Chloride: 105 mmol/L (ref 98–111)
Creatinine, Ser: 0.84 mg/dL (ref 0.44–1.00)
GFR, Estimated: >60 mL/min (ref >60)
Glucose, Bld: 103 mg/dL — ABNORMAL HIGH (ref 70–99)
Potassium: 3.5 mmol/L (ref 3.5–5.1)
Sodium: 136 mmol/L (ref 135–145)

## 2023-12-21 LAB — HEPARIN LEVEL (UNFRACTIONATED)
Heparin Unfractionated: 0.51 [IU]/mL (ref 0.30–0.70)
Heparin Unfractionated: 0.34 [IU]/mL (ref 0.30–0.70)

## 2023-12-21 LAB — URINE CULTURE: Culture: <10000 — AB

## 2023-12-21 LAB — MAGNESIUM: Magnesium: 2 mg/dL (ref 1.7–2.4)

## 2023-12-21 LAB — CBC
HCT: 29 % — ABNORMAL LOW (ref 36.0–46.0)
Hemoglobin: 10.4 g/dL — ABNORMAL LOW (ref 12.0–15.0)
MCH: 31.1 pg (ref 26.0–34.0)
MCHC: 35.9 g/dL (ref 30.0–36.0)
MCV: 86.8 fL (ref 80.0–100.0)
Platelets: 246 K/uL (ref 150–400)
RBC: 3.34 MIL/uL — ABNORMAL LOW (ref 3.87–5.11)
RDW: 13.1 % (ref 11.5–15.5)
WBC: 6.9 K/uL (ref 4.0–10.5)
nRBC: 0 % (ref 0.0–0.2)

## 2023-12-21 MED ORDER — ATORVASTATIN CALCIUM 40 MG PO TABS: 20.0000 mg | ORAL_TABLET | Freq: Every day | ORAL | Status: DC

## 2023-12-21 MED ORDER — PANTOPRAZOLE SODIUM 40 MG PO TBEC
40.0000 mg | DELAYED_RELEASE_TABLET | Freq: Every day | ORAL | Status: DC
  Administered 2023-12-21 – 2024-01-01 (×12): 40 mg via ORAL
  Filled 2023-12-21 (×12): qty 1

## 2023-12-21 MED ORDER — IOHEXOL 350 MG/ML SOLN
125.0000 mL | Freq: Once | INTRAVENOUS | Status: AC | PRN
  Administered 2023-12-21: 125 mL via INTRAVENOUS

## 2023-12-21 MED ORDER — POTASSIUM CHLORIDE CRYS ER 20 MEQ PO TBCR
40.0000 meq | EXTENDED_RELEASE_TABLET | Freq: Once | ORAL | Status: AC
  Administered 2023-12-21: 40 meq via ORAL
  Filled 2023-12-21: qty 2

## 2023-12-21 NOTE — TOC Initial Note (Signed)
 Transition of Care [facility]) - Initial/Assessment Note    Patient Details  Name: [name] MRN: [id number] Date of Birth: Apr 03, 1946  [facility]) CM/SW Contact:    Jon ONEIDA Anon, RN Phone Number: 12/21/2023, 3:45 PM  Clinical Narrative:                 Pt is from home alone. Recently discharged from skilled nursing facility. Prior to admission was receiving HH RN, PT, OT services through [facility]. PT evaluated and recommending SNF. NCM spoke with Glennette, APS social worker at [phone] and she states looking into LTC placement as pt is unable to properly care for herself in the home. Will need to send out referrals for SNF placement. Care Management following.  Expected Discharge Plan: Skilled Nursing Facility Barriers to Discharge: Continued Medical Work up   Patient Goals and CMS Choice Patient states their goals for this hospitalization and ongoing recovery are:: To go to SNF CMS Medicare.gov Compare Post Acute Care list provided to:: Patient Choice offered to / list presented to : Patient [place] ownership interest in [facility].provided to:: Patient    Expected Discharge Plan and Services In-house Referral: NA Discharge Planning Services: CM Consult Post Acute Care Choice: Skilled Nursing Facility Living arrangements for the past 2 months: Apartment                 DME Arranged: N/A DME Agency: NA       HH Arranged: NA HH Agency: Programmer, multimedia spoke with at [facility] Agency: Brandi with [facility]  Prior Living Arrangements/Services Living arrangements for the past 2 months: Apartment Lives with:: Self Patient language and need for interpreter reviewed:: Yes Do you feel safe going back to the place where you live?: Yes      Need for Family Participation in Patient Care: Yes (Comment) Care giver support system in place?: Yes (comment) Current home services: Home RN, Home PT, Home OT Criminal  Activity/Legal Involvement Pertinent to Current Situation/Hospitalization: No - Comment as needed  Activities of Daily Living   ADL Screening (condition at time of admission) Independently performs ADLs?: No Does the patient have a NEW difficulty with bathing/dressing/toileting/self-feeding that is expected to last >3 days?: Yes (Initiates electronic notice to provider for possible OT consult) Does the patient have a NEW difficulty with getting in/out of bed, walking, or climbing stairs that is expected to last >3 days?: No Does the patient have a NEW difficulty with communication that is expected to last >3 days?: No Is the patient deaf or have difficulty hearing?: No Does the patient have difficulty seeing, even when wearing glasses/contacts?: No Does the patient have difficulty concentrating, remembering, or making decisions?: No  Permission Sought/Granted Permission sought to share information with : Family Supports, Magazine features editor, Other (comment) Permission granted to share information with : Yes, Verbal Permission Granted  Share Information with NAME: [email or web address],deshannon (Niece)  [phone]  Permission granted to share info w AGENCY: [facility]     Permission granted to share info w Contact Information: Glennette [phone] (APS Social worker)  Emotional Assessment Appearance:: Other (Comment Required (Unable to assess) Attitude/Demeanor/Rapport: Unable to Assess Affect (typically observed): Unable to Assess Orientation: : Oriented to Self, Oriented to Place, Oriented to  Time Alcohol / Substance Use: Not Applicable Psych Involvement: No (comment)  Admission diagnosis:  Generalized abdominal pain [R10.84] Acute pulmonary embolism (HCC) [I26.99] Acute pulmonary embolism with acute cor pulmonale, unspecified pulmonary embolism  type [facility]) [I26.09] Patient Active Problem List   Diagnosis Date Noted   Acute pulmonary embolism (HCC) 12/19/2023   Acute deep vein  thrombosis (DVT) of femoral vein of left lower extremity (HCC) 12/19/2023   History of stroke without residual deficits 11/15/2023   Polyneuropathy due to type 2 diabetes mellitus (HCC) 11/15/2023   Primary localized osteoarthrosis of ankle and foot 11/15/2023   Type 2 diabetes mellitus (HCC) 11/15/2023   Generalized weakness 10/31/2023   Nausea vomiting and diarrhea 10/31/2023   Hypokalemia 10/31/2023   Renal insufficiency 10/31/2023   Essential hypertension 10/31/2023   Rheumatoid arthritis (HCC) 10/31/2023   Hyperlipidemia 10/31/2023   Moderate persistent asthma with acute exacerbation 12/05/2017   Perennial allergic rhinitis 09/20/2017   Infection of flexor tendon sheath 08/06/2017   Open wound of left hand, subsequent encounter 08/06/2017   Pain of finger of left hand 08/06/2017   Major depressive disorder 07/31/2017   Insomnia 02/23/2015   Obstructive sleep apnea syndrome 02/23/2015   Asthma 02/23/2015   Chest pain on exertion 04/10/2013   Menopausal hot flushes 01/22/2013   Frequency of urination 01/22/2013   PCP:  Rosalea Rosina SAILOR, PA Pharmacy:   [facility] [address], [place] - [address] [place] [address] Phone: [phone] Fax: [phone]  [facility] - [place], [place] - [address] [place] [address] Phone: [phone] Fax: [phone]  [facility] [address] [place] - [address] [place] [address] Phone: [phone] Fax: [phone]     Social Drivers of Health (SDOH) Social History: SDOH Screenings   Food Insecurity: No Food Insecurity (12/19/2023)  Housing: Low Risk  (12/19/2023)  Transportation Needs: No Transportation Needs (12/19/2023)  Utilities: Not At Risk (12/19/2023)  Social Connections: Socially Isolated (12/19/2023)  Tobacco Use: Medium Risk (12/19/2023)   SDOH Interventions:      Readmission Risk Interventions    12/21/2023    3:40 PM  Readmission Risk Prevention Plan  Transportation Screening Complete  PCP or Specialist Appt within 3-5 Days Complete  HRI or Home Care Consult Complete  Social Work Consult for Recovery Care Planning/Counseling Complete  Palliative Care Screening Not Applicable  Medication Review [employer]) Complete

## 2023-12-21 NOTE — Progress Notes (Signed)
 NAME:  [name], MRN:  [id number], DOB:  01-Nov-1945, LOS: 2 ADMISSION DATE:  12/19/2023, CONSULTATION DATE:  12/20/2023 REFERRING MD:  Dr. Verdene, CHIEF COMPLAINT:  PE   History of Present Illness:   Ms. Peto is a 78 year old female with PMH of CVA, HTN, HLD, asthma, GERD and RA who presented to the ED 8/13 for evaluation of dyspnea and abdominal pain. Of note, patient was recently admitted 6/24-6/28 with generalized weakness felt to be due to deconditioning/chronic debility. Patient was discharged to a SNF and ultimately sent home. On 7/21 she was seen again in the ED for acute on chronic back pain interfering with mobility. She was boarded in the ED and sent to a SNF on 7/23. She returned home not long before representing to the ED.   In the ED a CT abdomen pelvis demonstrated acute to subacute PE within the left main pulmonary artery peripherally extending into the lower lobar pulmonary arteries with CT evidence of right heart strain with shift of the intraventricular septum to the left and acute DVT within the left common femoral vein. She was started on a heparin  drip and PCCM was consulted for assistance with management/risk stratification.   On evaluation she is breathing comfortably on room air and hemodynamically stable. Patient is a poor historian. She tells me she has had chest and leg pain for 2-3 years. She currently has 6/10 burning lower chest pain. It is constant and not relieved or aggravated by anything. She endorses some shortness of breath but thinks it is mostly the chest pain. She denies worsening pain or dyspnea with deep breaths. She tells me she is mostly immobile and just recently left a facility.   Pertinent  Medical History  CVA HTN HLD  Asthma GERD RA Chronic back pain Decreased mobility   Significant Hospital Events: Including procedures, antibiotic start and stop dates in addition to other pertinent events   12/19/2023: Presented to ED with dyspnea  and abdominal pain found to have acute to subacute PE on CT imaging. Started on heparin  drip and PCCM consulted  Interim History / Subjective:  No distress  Objective    Blood pressure 93/64, pulse 84, temperature 98.9 F (37.2 C), temperature source Oral, resp. rate (!) 24, height 5' 1 (1.549 m), weight 76.3 kg, SpO2 99%.        Intake/Output Summary (Last 24 hours) at 12/21/2023 0733 Last data filed at 12/21/2023 0600 Gross per 24 hour  Intake 2385.32 ml  Output 380 ml  Net 2005.32 ml   Filed Weights   12/19/23 2208 12/19/23 2339  Weight: 86.9 kg 76.3 kg    Examination: General this is a 78 year old debilitated female. She is resting in bed. No distress today  HENT NCAT no JVD  Pulm clear. Dec bases. No accessory use. On 2 lpm sats mid 90s Card rrr w/out MRG Abd soft not tender Ext warm trace LE edema  Neuro intact  Resolved problem list   Assessment and Plan   Acute to subacute PE, likely provoked in the setting of immobility (no evidence of RV strain on echo/no increased PAPs) Dyspnea Left common femoral vein DVT PESI score 76, class II low risk. Normal cardiac biomarkers and lactic acid.  Plan: Wean o2 moblize Cont ac OK to transition to oral AC of choice complete at least 19mo.    Best Practice (right click and Reselect all SmartList Selections daily)   Per primary    We will sign  off   Maude FORBES Banner ACNP-BC [facility] Pulmonary/Critical Care Pager # [phone] OR # [phone] if no answer

## 2023-12-21 NOTE — Progress Notes (Addendum)
 [facility] PROGRESS NOTE   [name] FMW:[id number] DOB: [birth year] DOA: 12/19/2023  PCP: Rosalea Rosina SAILOR, PA  Brief History: 78 y.o. female with medical history significant for CVA, nonobstructive CAD, HTN, HLD, asthma, GERD, RA who presented to the ED for evaluation of abdominal pain and shortness of breath. Patient states that she is having some burning sensation with urination. Patient recently admitted 6/24-6/28 for generalized weakness felt due to physical deconditioning/chronic debility.  Imaging was without evidence of acute abnormality in the spine, cord compression, or high-grade stenosis.  It did mention left foraminal to extraforaminal disc protrusion at L2-3 potentially affecting the exiting left L2 nerve root.  Patient was ultimately discharged to SNF per PT/OT recommendation. After discharge from SNF to home patient was again seen in ED on 7/21 with acute on chronic back pain interfering with mobility and ADLs.  She boarded in the ED and was discharged to SNF on 7/23.  Patient returned to home about 6 days prior to this admission.  She says she lives alone and usually ambulates with use of a cane.  Evaluation in the ED reviewed acute pulmonary embolism.  She was hospitalized for further management.  Consultants: Pulmonology.  Vascular surgery, Dr. Sheree  Procedures: Echocardiogram.  Lower extremity Doppler.    Subjective/Interval History: Patient denies any chest pain this morning.  Complains of feeling weak and fatigued.  Complains of her usual back pain.  Has been able to ambulate at home using a cane.  Her mobility is still very poor.  She is mostly sedentary.    Assessment/Plan:  Acute versus subacute pulmonary embolism/acute DVT The only risk factor identified is sedentary lifestyle for the last several weeks due to her back issue.  Patient denies any history of cancer.  No recent travel. Patient is seen by pulmonology.  No plans for thrombolytics for  her acute PE. Episodes of hypotension noted overnight which stabilized with IV fluids. Echocardiogram shows normal systolic function.  Right ventricular systolic function is noted to be normal. Lower extremity Doppler study shows extensive DVT involving both extremities. Patient denies any significant pain in the legs but the legs are swollen. This was discussed with Dr. Sheree with vascular surgery who recommends a CT venogram of the abdomen pelvis to see the extent of the clot.  He will see the patient later today. For now we will continue with IV heparin  until vascular plans are clear.  Hypotension Noted to have low blood pressures overnight.  Her antihypertensives are on hold as mentioned below.  Could have been from PE though it is unclear.  Improved after she was given fluid bolus.  Continue to monitor closely.  Acute UTI Bladder appeared to be abnormal on CT scan suggesting UTI.  Patient mentioned dysuria.  UA is noted to be abnormal.  Continue with ceftriaxone .  She was given Diflucan  due to presence of yeast on the UA.  Follow-up on cultures.  Normocytic anemia Drop in hemoglobin is noted.  No evidence of overt bleeding.  Recheck labs in the morning.  Anemia panel.  History of coronary artery disease/history of stroke/hyperlipidemia Aspirin  on hold.  Continue statin.  Chronic back pain PT and OT eval when more stable.  Essential hypertension Holding amlodipine  metoprolol  and furosemide  due to soft blood pressures.  See above.  History of asthma Continue home medications.  Obesity Estimated body mass index is 31.78 kg/m as calculated from the following:   Height as of this encounter: 5' 1 (1.549 m).  Weight as of this encounter: 76.3 kg.   DVT Prophylaxis: IV heparin  Code Status: Full code Family Communication: Discussed with patient Disposition Plan: To be determined     Medications: Scheduled:  benztropine   0.5 mg Oral BID   budesonide -glycopyrrolate -formoterol    2 puff Inhalation Daily   buPROPion   150 mg Oral Daily   Chlorhexidine  Gluconate Cloth  6 each Topical Daily   fluPHENAZine   5 mg Oral TID   montelukast   10 mg Oral QHS   polyethylene glycol  17 g Oral BID   potassium chloride   40 mEq Oral Once   rosuvastatin   10 mg Oral Daily   senna-docusate  2 tablet Oral BID   sodium chloride  flush  10-40 mL Intracatheter Q12H   Continuous:  cefTRIAXone  (ROCEPHIN )  IV Stopped (12/20/23 1016)   heparin  1,250 Units/hr (12/21/23 0600)   PRN:acetaminophen  **OR** acetaminophen , albuterol , bisacodyl , ondansetron  **OR** ondansetron  (ZOFRAN ) IV, mouth rinse, sodium chloride  flush, sodium phosphate  Antibiotics: Anti-infectives (From admission, onward)    Start     Dose/Rate Route Frequency Ordered Stop   12/20/23 1015  fluconazole  (DIFLUCAN ) tablet 200 mg        200 mg Oral  Once 12/20/23 0926 12/20/23 0944   12/20/23 1000  fluconazole  (DIFLUCAN ) tablet 200 mg  Status:  Discontinued        200 mg Oral Daily 12/20/23 0512 12/20/23 0926   12/20/23 1000  cefTRIAXone  (ROCEPHIN ) 1 g in sodium chloride  0.9 % 100 mL IVPB        1 g 200 mL/hr over 30 Minutes Intravenous Every 24 hours 12/20/23 0916         Objective:  Vital Signs  Vitals:   12/21/23 0500 12/21/23 0600 12/21/23 0700 12/21/23 0811  BP: 118/71 126/70 93/64   Pulse:  91 84   Resp: (!) 22 (!) 23 (!) 24   Temp:    98.8 F (37.1 C)  TempSrc:    Axillary  SpO2:  97% 99%   Weight:      Height:        Intake/Output Summary (Last 24 hours) at 12/21/2023 0920 Last data filed at 12/21/2023 0600 Gross per 24 hour  Intake 2385.32 ml  Output 380 ml  Net 2005.32 ml   Filed Weights   12/19/23 2208 12/19/23 2339  Weight: 86.9 kg 76.3 kg    General appearance: Awake alert.  In no distress Resp: Clear to auscultation bilaterally.  Normal effort Cardio: S1-S2 is normal regular.  No S3-S4.  No rubs murmurs or bruit GI: Abdomen is soft.  Nontender nondistended.  Bowel sounds are present  normal.  No masses organomegaly Extremities:  lower extremity swelling is noted bilaterally.  Physical deconditioning noted.  Decreased range of motion.  Lab Results:  Data Reviewed: I have personally reviewed following labs and reports of the imaging studies  CBC: Recent Labs  Lab 12/19/23 1906 12/20/23 0017 12/21/23 0541  WBC 9.0 8.9 6.9  HGB 13.0 13.0 10.4*  HCT 37.0 36.2 29.0*  MCV 86.4 86.8 86.8  PLT 353 342 246    Basic Metabolic Panel: Recent Labs  Lab 12/19/23 1906 12/20/23 0017 12/21/23 0541  NA 133* 135 136  K 3.6 3.7 3.5  CL 98 97* 105  CO2 24 21* 21*  GLUCOSE 120* 109* 103*  BUN 16 15 9   CREATININE 1.06* 0.97 0.84  CALCIUM  9.0 9.4 8.2*  MG  --   --  2.0    GFR: Estimated Creatinine Clearance: 53.2 mL/min (by  C-G formula based on SCr of 0.84 mg/dL).  Liver Function Tests: Recent Labs  Lab 12/19/23 1906  AST 18  ALT 11  ALKPHOS 64  BILITOT 1.2  PROT 7.1  ALBUMIN 3.4*    Recent Labs  Lab 12/19/23 1906  LIPASE 30    Coagulation Profile: Recent Labs  Lab 12/20/23 0017  INR 1.2    Recent Results (from the past 240 hours)  MRSA Next Gen by PCR, Nasal     Status: None   Collection Time: 12/19/23 10:59 PM   Specimen: Nasal Mucosa; Nasal Swab  Result Value Ref Range Status   MRSA by PCR Next Gen NOT DETECTED NOT DETECTED Final    Comment: (NOTE) The GeneXpert MRSA Assay (FDA approved for NASAL specimens only), is one component of a comprehensive MRSA colonization surveillance program. It is not intended to diagnose MRSA infection nor to guide or monitor treatment for MRSA infections. Test performance is not FDA approved in patients less than 68 years old. Performed at [facility], [address] [place], [place] [address]       Radiology Studies: VAS US  LOWER EXTREMITY VENOUS (DVT) Result Date: 12/20/2023  Lower Venous DVT Study Patient Name:  [name]  Date of Exam:   12/20/2023 Medical Rec #:  [id number]         Accession #:    [id number] Date of Birth: 1945-08-14        Patient Gender: F Patient Age:   61 years [facility]am [facility] Procedure:      VAS US  LOWER EXTREMITY VENOUS (DVT) Referring Phys: REXENE BLUSH --------------------------------------------------------------------------------  Indications: Pulmonary embolism.  Anticoagulation: Heparin . Limitations: Poor ultrasound/tissue interface, patient positioning, patient immobility and body habitus. Comparison Study: No prior studies. Performing Technologist: Cordella Collet RVT  Examination Guidelines: A complete evaluation includes B-mode imaging, spectral Doppler, color Doppler, and power Doppler as needed of all accessible portions of each vessel. Bilateral testing is considered an integral part of a complete examination. Limited examinations for reoccurring indications may be performed as noted. The reflux portion of the exam is performed with the patient in reverse Trendelenburg.  +---------+---------------+---------+-----------+----------+-------------------+ RIGHT    CompressibilityPhasicitySpontaneityPropertiesThrombus Aging      +---------+---------------+---------+-----------+----------+-------------------+ CFV      Partial        Yes      Yes                  Acute               +---------+---------------+---------+-----------+----------+-------------------+ SFJ      Partial                                      Acute               +---------+---------------+---------+-----------+----------+-------------------+ FV Prox  Partial        Yes      Yes                  Acute               +---------+---------------+---------+-----------+----------+-------------------+ FV Mid   None           No       No                   Acute               +---------+---------------+---------+-----------+----------+-------------------+  FV DistalNone           No       No                   Acute                +---------+---------------+---------+-----------+----------+-------------------+ PFV      Full                                                             +---------+---------------+---------+-----------+----------+-------------------+ POP      None           No       No                   Acute               +---------+---------------+---------+-----------+----------+-------------------+ PTV      Full                                                             +---------+---------------+---------+-----------+----------+-------------------+ PERO                                                  Not well visualized +---------+---------------+---------+-----------+----------+-------------------+ Gastroc  Partial                                      Acute               +---------+---------------+---------+-----------+----------+-------------------+ EIV                     Yes      Yes                                      +---------+---------------+---------+-----------+----------+-------------------+   +---------+---------------+---------+-----------+----------+-------------------+ LEFT     CompressibilityPhasicitySpontaneityPropertiesThrombus Aging      +---------+---------------+---------+-----------+----------+-------------------+ CFV      Partial        Yes      Yes                  Acute               +---------+---------------+---------+-----------+----------+-------------------+ SFJ      None                                         Acute               +---------+---------------+---------+-----------+----------+-------------------+ FV Prox  Full                                                             +---------+---------------+---------+-----------+----------+-------------------+  FV Mid   Full                                                             +---------+---------------+---------+-----------+----------+-------------------+  FV DistalFull                                                             +---------+---------------+---------+-----------+----------+-------------------+ PFV      Full                                                             +---------+---------------+---------+-----------+----------+-------------------+ POP      Full           Yes      Yes                                      +---------+---------------+---------+-----------+----------+-------------------+ PTV                                                   Not well visualized +---------+---------------+---------+-----------+----------+-------------------+ PERO                                                  Not well visualized +---------+---------------+---------+-----------+----------+-------------------+ EIV                     Yes      Yes                                      +---------+---------------+---------+-----------+----------+-------------------+     Summary: RIGHT: - Findings consistent with acute deep vein thrombosis involving the right common femoral vein, SF junction, right femoral vein, and right popliteal vein. Findings consistent with acute intramuscular thrombosis involving the right gastrocnemius veins. - No cystic structure found in the popliteal fossa.  LEFT: - Findings consistent with acute deep vein thrombosis involving the left common femoral vein, and SF junction. - Findings consistent with acute superficial vein thrombosis involving the left great saphenous vein.  - No cystic structure found in the popliteal fossa.  *See table(s) above for measurements and observations. Electronically signed by Gaile New MD on 12/20/2023 at 7:11:03 PM.    Final    ECHOCARDIOGRAM COMPLETE Result Date: 12/20/2023    ECHOCARDIOGRAM REPORT   Patient Name:   [name] Date of Exam: 12/20/2023 Medical Rec #:  [id number]        Height:       61.0 in Accession #:  [id number]       Weight:       168.2  lb Date of Birth:  1945/08/20       BSA:          1.755 m Patient Age:    77 years         BP:           114/71 mmHg Patient Gender: F                HR:           109 bpm. Exam Location:  Inpatient Procedure: Cardiac Doppler, Color Doppler and Intracardiac Opacification Agent            (Both Spectral and Color Flow Doppler were utilized during            procedure). Indications:    Pulmonary Embolism  History:        Patient has prior history of Echocardiogram examinations, most                 recent 02/22/2022. Stroke; Risk Factors:Hypertension, Sleep                 Apnea, Diabetes and Dyslipidemia.  Sonographer:    Jayson Gaskins Referring Phys: [id number] VISHAL R PATEL  Sonographer Comments: Technically difficult study due to poor echo windows. IMPRESSIONS  1. Left ventricular ejection fraction, by estimation, is 65 to 70%. The left ventricle has normal function. The left ventricle has no regional wall motion abnormalities. Left ventricular diastolic parameters are consistent with Grade I diastolic dysfunction (impaired relaxation).  2. Right ventricular systolic function is normal. The right ventricular size is normal. There is normal pulmonary artery systolic pressure. The estimated right ventricular systolic pressure is 30.7 mmHg.  3. The mitral valve is grossly normal. No evidence of mitral valve regurgitation. No evidence of mitral stenosis.  4. The aortic valve is grossly normal. Aortic valve regurgitation is not visualized. No aortic stenosis is present.  5. The inferior vena cava is normal in size with greater than 50% respiratory variability, suggesting right atrial pressure of 3 mmHg. Comparison(s): No significant change from prior study. FINDINGS  Left Ventricle: Left ventricular ejection fraction, by estimation, is 65 to 70%. The left ventricle has normal function. The left ventricle has no regional wall motion abnormalities. Definity  contrast agent was given IV to delineate the left ventricular   endocardial borders. The left ventricular internal cavity size was normal in size. There is no left ventricular hypertrophy. Left ventricular diastolic parameters are consistent with Grade I diastolic dysfunction (impaired relaxation). Right Ventricle: The right ventricular size is normal. No increase in right ventricular wall thickness. Right ventricular systolic function is normal. There is normal pulmonary artery systolic pressure. The tricuspid regurgitant velocity is 2.63 m/s, and  with an assumed right atrial pressure of 3 mmHg, the estimated right ventricular systolic pressure is 30.7 mmHg. Left Atrium: Left atrial size was normal in size. Right Atrium: Right atrial size was normal in size. Pericardium: There is no evidence of pericardial effusion. Mitral Valve: The mitral valve is grossly normal. No evidence of mitral valve regurgitation. No evidence of mitral valve stenosis. Tricuspid Valve: The tricuspid valve is grossly normal. Tricuspid valve regurgitation is trivial. No evidence of tricuspid stenosis. Aortic Valve: The aortic valve is grossly normal. Aortic valve regurgitation is not visualized. No aortic stenosis is present. Pulmonic Valve: The pulmonic valve was grossly normal. Pulmonic valve regurgitation is not visualized. No evidence of  pulmonic stenosis. Aorta: The aortic root and ascending aorta are structurally normal, with no evidence of dilitation. Venous: The inferior vena cava is normal in size with greater than 50% respiratory variability, suggesting right atrial pressure of 3 mmHg. IAS/Shunts: The atrial septum is grossly normal.  LEFT VENTRICLE PLAX 2D LVIDd:         4.20 cm Diastology LVIDs:         2.80 cm LV e' medial:    5.11 cm/s LV PW:         1.10 cm LV E/e' medial:  9.1 LV IVS:        0.80 cm LV e' lateral:   5.98 cm/s                        LV E/e' lateral: 7.8  IVC IVC diam: 1.60 cm  AORTA Ao Asc diam: 3.10 cm MITRAL VALVE               TRICUSPID VALVE MV Area (PHT): 4.15 cm     TR Peak grad:   27.7 mmHg MV E velocity: 46.70 cm/s  TR Vmax:        263.00 cm/s MV A velocity: 85.20 cm/s MV E/A ratio:  0.55 Darryle Decent MD Electronically signed by Darryle Decent MD Signature Date/Time: 12/20/2023/10:48:10 AM    Final    CT ABDOMEN PELVIS W CONTRAST Addendum Date: 12/19/2023 ADDENDUM REPORT: 12/19/2023 21:00 ADDENDUM: These results were called by telephone at the time of interpretation on 12/19/2023 at 9:00 pm to provider SCOTT ZACKOWSKI , who verbally acknowledged these results. Electronically Signed   By: Dorethia Molt M.D.   On: 12/19/2023 21:00   Result Date: 12/19/2023 CLINICAL DATA:  Acute nonlocalized abdominal pain, generalized weakness EXAM: CT ABDOMEN AND PELVIS WITH CONTRAST TECHNIQUE: Multidetector CT imaging of the abdomen and pelvis was performed using the standard protocol following bolus administration of intravenous contrast. RADIATION DOSE REDUCTION: This exam was performed according to the departmental dose-optimization program which includes automated exposure control, adjustment of the mA and/or kV according to patient size and/or use of iterative reconstruction technique. CONTRAST:  OMNIPAQUE  IOHEXOL  300 MG/ML  SOLN COMPARISON:  None Available. FINDINGS: Lower chest: The mid images of the thorax demonstrate an intraluminal central filling defect within the left main pulmonary artery peripherally extending into the lower lobar pulmonary arteries compatible with an acute to subacute pulmonary embolism. The central pulmonary arteries are enlarged in keeping with changes of pulmonary arterial hypertension. There is CT evidence of right heart strain with relative shift of the intraventricular septum to the left (15/3) which is new from prior examination of 06/08/2022. Visualized lung bases are clear. Small hiatal hernia. Hepatobiliary: No focal liver abnormality is seen. No gallstones, gallbladder wall thickening, or biliary dilatation. Pancreas: Unremarkable Spleen:  Unremarkable Adrenals/Urinary Tract: The adrenal glands are unremarkable. The kidneys are normal in size and position. No hydronephrosis. No intrarenal or ureteral calculi. The bladder is mildly distended. There is hyperemia the bladder wall and subtle perivesicular inflammatory stranding which suggests an underlying infectious or inflammatory cystitis. Stomach/Bowel: Stomach is within normal limits. Appendix appears normal. No evidence of bowel wall thickening, distention, or inflammatory changes. Vascular/Lymphatic: There is expansile intraluminal filling defect within the left common femoral vein (76/3) in keeping with probable acute DVT. The abdominal vasculature is otherwise unremarkable. No pathologic adenopathy within the abdomen and pelvis. Reproductive: Status post hysterectomy. No adnexal masses. Other: No abdominal wall hernia or abnormality. No  abdominopelvic ascites. Musculoskeletal: No acute bone abnormality. No lytic or blastic bone lesion. Osseous structures are age appropriate. IMPRESSION: 1. Acute to subacute pulmonary embolism within the left main pulmonary artery peripherally extending into the lower lobar pulmonary arteries. CT evidence of right heart strain with relative shift of the intraventricular septum to the left. 2. Acute DVT within the left common femoral vein. 3. Hyperemia of the bladder wall and subtle perivesicular inflammatory stranding suggesting an underlying infectious or inflammatory cystitis. Correlation with urinalysis and urine culture may be helpful. 4. Small hiatal hernia. Electronically Signed: By: Dorethia Molt M.D. On: 12/19/2023 20:56   CT Head Wo Contrast Result Date: 12/19/2023 CLINICAL DATA:  Altered mental status EXAM: CT HEAD WITHOUT CONTRAST TECHNIQUE: Contiguous axial images were obtained from the base of the skull through the vertex without intravenous contrast. RADIATION DOSE REDUCTION: This exam was performed according to the departmental dose-optimization  program which includes automated exposure control, adjustment of the mA and/or kV according to patient size and/or use of iterative reconstruction technique. COMPARISON:  None Available. FINDINGS: Brain: Normal anatomic configuration. No abnormal intra or extra-axial mass lesion or fluid collection. No abnormal mass effect or midline shift. No evidence of acute intracranial hemorrhage or infarct. Ventricular size is normal. Cerebellum unremarkable. Vascular: Unremarkable Skull: Intact Sinuses/Orbits: Paranasal sinuses are clear. Orbits are unremarkable. Other: Mastoid air cells and middle ear cavities are clear. IMPRESSION: 1. No acute intracranial abnormality. Normal examination. Electronically Signed   By: Dorethia Molt M.D.   On: 12/19/2023 21:00   DG Chest Port 1 View Result Date: 12/19/2023 CLINICAL DATA:  Abdominal pain EXAM: PORTABLE CHEST - 1 VIEW COMPARISON:  October 30, 2023 FINDINGS: Low lung volumes with elevation of the right hemidiaphragm. No focal airspace consolidation, pleural effusion, or pneumothorax. No cardiomegaly.Aortic atherosclerosis. Osteopenia. No acute fracture or destructive lesion. Mild scoliosis of the thoracic spine with multilevel degenerative disc disease. IMPRESSION: Low lung volumes with left basilar atelectasis. Electronically Signed   By: Rogelia Myers M.D.   On: 12/19/2023 20:28       LOS: 2 days   Joette Pebbles  [facility] Pager on [email or web address]  12/21/2023, 9:20 AM

## 2023-12-21 NOTE — Progress Notes (Signed)
 PHARMACY - ANTICOAGULATION CONSULT NOTE  Pharmacy Consult for Heparin  Indication:  pulmonary embolus w/ right heart strain & acute DVT in left common femoral vein  No Known Allergies  Patient Measurements: Height: 5' 1 (154.9 cm) Weight: 76.3 kg (168 lb 3.4 oz) IBW/kg (Calculated) : 47.8 HEPARIN  DW (KG): 64.7 Height = 61 Weight (11/26/23) = 86.6 kg, initially documented as the same here then documented as 76.3 kg Heparin  dosing weight = 67 kg  Vital Signs: Temp: 98.4 F (36.9 C) (08/15 1713) Temp Source: Axillary (08/15 1713) Pulse Rate: 105 (08/15 1516)  Labs: Recent Labs    12/19/23 1906 12/20/23 0017 12/20/23 9361 12/20/23 0822 12/20/23 1003 12/20/23 1505 12/20/23 2115 12/21/23 0541 12/21/23 0846 12/21/23 1921  HGB 13.0 13.0  --   --   --   --   --  10.4*  --   --   HCT 37.0 36.2  --   --   --   --   --  29.0*  --   --   PLT 353 342  --   --   --   --   --  246  --   --   APTT  --  84*  --   --   --   --   --   --   --   --   LABPROT  --  16.0*  --   --   --   --   --   --   --   --   INR  --  1.2  --   --   --   --   --   --   --   --   HEPARINUNFRC  --   --    < >  --   --    < > 0.28*  --  0.51 0.34  CREATININE 1.06* 0.97  --   --   --   --   --  0.84  --   --   TROPONINIHS  --   --   --  7 19*  --   --   --   --   --    < > = values in this interval not displayed.    Estimated Creatinine Clearance: 53.2 mL/min (by C-G formula based on SCr of 0.84 mg/dL).   Medical History: Past Medical History:  Diagnosis Date   Allergy    Arthritis    Asthma    CAD (coronary artery disease)    Chronic headaches    Depression    Diabetes mellitus, type II (HCC)    Diverticulosis    Endometriosis    GERD (gastroesophageal reflux disease)    Heart attack (HCC)    High cholesterol    Hypertension    Mallory-Weiss tear 2003   Mood swings    Rheumatoid arthritis (HCC)    Vitamin D deficiency     Medications:  No oral anticoagulation PTA  Assessment: 78 yr  female brought to ED for nursing home placement with c/o weakness. During medical workup to clear for facility placement, CTA with acute to subacute PE with evidence of right heart strain, and also found to have acute DVT in left common femoral vein. Pharmacy was consulted for heparin  management.  Today: -Heparin  level 0.34 - remains therapeutic with heparin  infusing at 1250 units/hr -Hgb decreased to 10.4  -No complications of therapy noted  Note vascular surgery not planning any intervention at this time.  Goal of Therapy:  Heparin  level 0.3-0.7 units/ml Monitor platelets by anticoagulation protocol: Yes   Plan:  -Continue heparin  infusion at 1250 units/hr -Daily heparin  level and CBC -F/u for transition to oral anticoagulant   Stefano MARLA Bologna, PharmD, BCPS Clinical Pharmacist 12/21/2023 7:48 PM

## 2023-12-21 NOTE — Evaluation (Addendum)
 Physical Therapy Evaluation Patient Details Name: [name] MRN: [id number] DOB: 12-07-1945 Today's Date: 12/21/2023  History of Present Illness  [name] is a 78 y.o. female presents to therapy following hospital admission on 12/19/2023 due to pain all over including angina, SOB, productive cough, burning with urination and B LE edema. Pt found to have PE and started on heparin  drip, R heart strain and L femoral DVT, small hiatal hernia and inflammatory process of bladder indicative of infection. Pt had recent hospitalization 7/21-7/23 secondary to LBP and 6/24-6/28 both hospitalizations pt d/c to SNF.  Pt EFY:pwrolizd but is not limited to:  HTN, CVA with L sided deficits UE (finger contractures) and LE (hip and knee flexion contractures), diabetes, heart attack, GERD, RA chronic LBP.  Clinical Impression  Pt admitted with above diagnosis.  Pt currently with functional limitations due to the deficits listed below (see PT Problem List). Pt in bed when therapist arrived. Pt agreeable to therapy eval. Pt exhibited intermittent and fluctuating confusion and difficulty providing clear insight to PLOF since d/c from SNF ~ 6 days ago. Pt reported she was unable to get OOB. Pt required max A x 2 for supine to sit, pt required mod A x 2 for sit to stand  from EOB with hand over hand A to place L UE on Rw and to maintain grasp, once in standing min to mod A x 1 for balance with cues for extension posture and anterior weight shift, pt demonstrated difficulty advancing R LE and required max A x 2 for safe transfer to R side with RW and absent eccentric control, trial with stedy, mod A x 2 and pt unable to use L UE to pull to stand, once in standing pt able to maintain static standing with CGA to close S and no overt LOB or B LE instability. Pt left seated in recliner, all needs in place. Patient will benefit from continued inpatient follow up therapy, <3 hours/day. Pt will benefit from acute skilled PT  to increase their independence and safety with mobility to allow discharge.         If plan is discharge home, recommend the following: Assistance with cooking/housework;Assist for transportation;Two people to help with walking and/or transfers;A lot of help with bathing/dressing/bathroom;Supervision due to cognitive status;Direct supervision/assist for financial management;Direct supervision/assist for medications management   Can travel by private vehicle   No    Equipment Recommendations None recommended by PT  Recommendations for Other Services       Functional Status Assessment Patient has had a recent decline in their functional status and demonstrates the ability to make significant improvements in function in a reasonable and predictable amount of time.     Precautions / Restrictions Precautions Precautions: Fall Recall of Precautions/Restrictions: Intact Restrictions Weight Bearing Restrictions Per Provider Order: No      Mobility  Bed Mobility Overal bed mobility: Needs Assistance Bed Mobility: Supine to Sit, Sit to Supine     Supine to sit: Mod assist, HOB elevated, Used rails Sit to supine: Max assist, HOB elevated, Used rails   General bed mobility comments: mod A to come to sitting EOB, pt inching BLE towards EOB, heaviest assist with trunk despite cues for bedrail use and HOB elevated; max A to return to supine and scoot up in bed wtih bedpad and bed assist    Transfers Overall transfer level: Needs assistance Equipment used: Hemi-walker Transfers: Sit to/from Stand Sit to Stand: Mod assist  General transfer comment: mod A to power to stand with BLE braced against bed, hemi walker in R hand, able to complete 2 reps and take 1 sidestep up towards [facility], unable to take any steps away from bedside    Ambulation/Gait               General Gait Details: NT  Stairs            Wheelchair Mobility     Tilt Bed    Modified Rankin  (Stroke Patients Only)       Balance Overall balance assessment: Needs assistance Sitting-balance support: Feet supported, Single extremity supported Sitting balance-Leahy Scale: Fair     Standing balance support: Reliant on assistive device for balance, During functional activity, Single extremity supported Standing balance-Leahy Scale: Poor                               Pertinent Vitals/Pain Pain Assessment Pain Assessment: Faces Faces Pain Scale: Hurts little more Pain Location: back Pain Descriptors / Indicators: Aching, Constant Pain Intervention(s): Limited activity within patient's tolerance, Monitored during session, Repositioned    Home Living Family/patient expects to be discharged to:: Private residence Living Arrangements: Alone Available Help at Discharge: Personal care attendant Type of Home: Apartment Home Access: Level entry       Home Layout: One level Home Equipment: Agricultural consultant (2 wheels);Cane - single point;Shower seat;Wheelchair - manual Additional Comments: pt is a poor historian and information above taken from prior hospitalization    Prior Function Prior Level of Function : Needs assist;Independent/Modified Independent             Mobility Comments: pt reports ind with transfers and w/c mobility, limited amb in the home ADLs Comments: pt reports ind with sponge bath, toileting and dressing; but also reports has CNA 4:30-7:30 M-T and 2-4:30 F for household chores and self care assist, pt reports limited mobiltiy since transitioning home from [facility] 12/2023 ordering food on [employer].     Extremity/Trunk Assessment        Lower Extremity Assessment Lower Extremity Assessment: Generalized weakness LLE Deficits / Details: ankle DF to ~ -5 degrees from neutral, PF 3/5, knee and hip flexion contracure, LE with increased tone and grossly reminder 3/5 with pt able to initate movement, (L UE and LE weakness and contractures attributed to  late effect CVA with pt reporting ~ 8 years ago and no current L hand splint, pt may benefit from a L walker splint)       Communication   Communication Communication: No apparent difficulties    Cognition Arousal: Alert Behavior During Therapy: WFL for tasks assessed/performed   PT - Cognitive impairments: No family/caregiver present to determine baseline, Attention, Memory, Safety/Judgement                         Following commands: Impaired Following commands impaired: Follows one step commands with increased time     Cueing Cueing Techniques: Verbal cues, Tactile cues, Gestural cues, Visual cues     General Comments      Exercises     Assessment/Plan    PT Assessment Patient needs continued PT services  PT Problem List Decreased strength;Decreased activity tolerance;Decreased balance;Decreased mobility;Decreased knowledge of use of DME;Impaired tone;Obesity;Pain;Decreased range of motion;Decreased coordination;Decreased cognition;Decreased safety awareness       PT Treatment Interventions DME instruction;Gait training;Functional mobility training;Therapeutic activities;Therapeutic exercise;Balance training;Cognitive remediation;Patient/family education;Wheelchair  mobility training    PT Goals (Current goals can be found in the Care Plan section)  Acute Rehab PT Goals Patient Stated Goal: to be safe at home PT Goal Formulation: With patient Time For Goal Achievement: 12/25/23 Potential to Achieve Goals: Good    Frequency Min 2X/week     Co-evaluation PT/OT/SLP Co-Evaluation/Treatment: Yes Reason for Co-Treatment: Complexity of the patient's impairments (multi-system involvement);Necessary to address cognition/behavior during functional activity;For patient/therapist safety PT goals addressed during session: Mobility/safety with mobility;Balance;Proper use of DME OT goals addressed during session: ADL's and self-care;Proper use of Adaptive equipment and  DME       AM-PAC PT 6 Clicks Mobility  Outcome Measure Help needed turning from your back to your side while in a flat bed without using bedrails?: A Lot Help needed moving from lying on your back to sitting on the side of a flat bed without using bedrails?: A Lot Help needed moving to and from a bed to a chair (including a wheelchair)?: A Lot Help needed standing up from a chair using your arms (e.g., wheelchair or bedside chair)?: A Lot Help needed to walk in hospital room?: Total Help needed climbing 3-5 steps with a railing? : Total 6 Click Score: 10    End of Session Equipment Utilized During Treatment: Gait belt Activity Tolerance: Patient tolerated treatment well Patient left: in chair;with call bell/phone within reach;with chair alarm set Nurse Communication: Mobility status;Need for lift equipment PT Visit Diagnosis: Unsteadiness on feet (R26.81);Muscle weakness (generalized) (M62.81);Pain;Other abnormalities of gait and mobility (R26.89);Difficulty in walking, not elsewhere classified (R26.2) Pain - Right/Left:  (LBP) Pain - part of body:  (mid thoracic back)    Time: 8578-8495 PT Time Calculation (min) (ACUTE ONLY): 43 min   Charges:   PT Evaluation $PT Eval Moderate Complexity: 1 Mod PT Treatments $Therapeutic Activity: 8-22 mins PT General Charges $$ ACUTE PT VISIT: 1 Visit         [place], PT Acute Rehab   [place] VEAR Drone 12/21/2023, 3:33 PM

## 2023-12-21 NOTE — NC FL2 (Signed)
 [place]  MEDICAID FL2 LEVEL OF CARE FORM     IDENTIFICATION  Patient Name: [name] Birthdate: 1946-04-01 Sex: female Admission Date (Current Location): 12/19/2023  [facility] and [place] Number:  Producer, television/film/video and Address:  [facility],  [address] [place], [place] [address]      Provider Number: [id number]  Attending Physician Name and Address:  Verdene Purchase, MD  Relative Name and Phone Number:  karoline israel (Niece)  [phone]    Current Level of Care: Hospital Recommended Level of Care: Skilled Nursing Facility Prior Approval Number:    Date Approved/Denied:   PASRR Number: [id number]  Discharge Plan: SNF    Current Diagnoses: Patient Active Problem List   Diagnosis Date Noted   Acute pulmonary embolism (HCC) 12/19/2023   Acute deep vein thrombosis (DVT) of femoral vein of left lower extremity (HCC) 12/19/2023   History of stroke without residual deficits 11/15/2023   Polyneuropathy due to type 2 diabetes mellitus (HCC) 11/15/2023   Primary localized osteoarthrosis of ankle and foot 11/15/2023   Type 2 diabetes mellitus (HCC) 11/15/2023   Generalized weakness 10/31/2023   Nausea vomiting and diarrhea 10/31/2023   Hypokalemia 10/31/2023   Renal insufficiency 10/31/2023   Essential hypertension 10/31/2023   Rheumatoid arthritis (HCC) 10/31/2023   Hyperlipidemia 10/31/2023   Moderate persistent asthma with acute exacerbation 12/05/2017   Perennial allergic rhinitis 09/20/2017   Infection of flexor tendon sheath 08/06/2017   Open wound of left hand, subsequent encounter 08/06/2017   Pain of finger of left hand 08/06/2017   Major depressive disorder 07/31/2017   Insomnia 02/23/2015   Obstructive sleep apnea syndrome 02/23/2015   Asthma 02/23/2015   Chest pain on exertion 04/10/2013   Menopausal hot flushes 01/22/2013   Frequency of urination 01/22/2013    Orientation RESPIRATION BLADDER Height & Weight     Self, Time,  Place  Normal Incontinent Weight: 76.3 kg Height:  5' 1 (154.9 cm)  BEHAVIORAL SYMPTOMS/MOOD NEUROLOGICAL BOWEL NUTRITION STATUS      Continent Diet (Heart Healthy)  AMBULATORY STATUS COMMUNICATION OF NEEDS Skin   Extensive Assist Verbally Normal                       Personal Care Assistance Level of Assistance  Bathing, Feeding, Dressing Bathing Assistance: Limited assistance Feeding assistance: Limited assistance Dressing Assistance: Limited assistance     Functional Limitations Info  Sight, Hearing, Speech Sight Info: Impaired Hearing Info: Impaired Speech Info: Adequate    SPECIAL CARE FACTORS FREQUENCY  PT (By licensed PT), OT (By licensed OT)     PT Frequency: 5xwk OT Frequency: 5xwk            Contractures Contractures Info: Not present    Additional Factors Info  Code Status, Allergies, Psychotropic Code Status Info: FULL Allergies Info: No Known Allergies Psychotropic Info: benztropine  (COGENTIN ), buPROPion  (WELLBUTRIN  XL), fluPHENAZine  (PROLIXIN )         Current Medications (12/21/2023):  This is the current hospital active medication list Current Facility-Administered Medications  Medication Dose Route Frequency Provider Last Rate Last Admin   acetaminophen  (TYLENOL ) tablet 650 mg  650 mg Oral Q6H PRN Patel, Vishal R, MD       Or   acetaminophen  (TYLENOL ) suppository 650 mg  650 mg Rectal Q6H PRN Patel, Vishal R, MD       albuterol  (PROVENTIL ) (2.5 MG/3ML) 0.083% nebulizer solution 2.5 mg  2.5 mg Nebulization Q6H PRN Patel, Vishal R, MD  benztropine  (COGENTIN ) tablet 0.5 mg  0.5 mg Oral BID Krishnan, Gokul, MD   0.5 mg at 12/21/23 0941   bisacodyl  (DULCOLAX) EC tablet 10 mg  10 mg Oral Daily PRN Krishnan, Gokul, MD   10 mg at 12/21/23 0940   budesonide -glycopyrrolate -formoterol  (BREZTRI ) 160-9-4.8 MCG/ACT inhaler 2 puff  2 puff Inhalation Daily Patel, Vishal R, MD   2 puff at 12/21/23 0943   buPROPion  (WELLBUTRIN  XL) 24 hr tablet 150 mg  150  mg Oral Daily Krishnan, Gokul, MD   150 mg at 12/21/23 0941   cefTRIAXone  (ROCEPHIN ) 1 g in sodium chloride  0.9 % 100 mL IVPB  1 g Intravenous Q24H Krishnan, Gokul, MD 200 mL/hr at 12/21/23 0939 1 g at 12/21/23 9060   Chlorhexidine  Gluconate Cloth 2 % PADS 6 each  6 each Topical Daily Patel, Vishal R, MD   6 each at 12/20/23 0500   fluPHENAZine  (PROLIXIN ) tablet 5 mg  5 mg Oral TID Krishnan, Gokul, MD   5 mg at 12/21/23 0940   heparin  ADULT infusion 100 units/mL (25000 units/250mL)  1,250 Units/hr Intravenous Continuous Lilliston, Andrea M, RPH 12.5 mL/hr at 12/21/23 0600 1,250 Units/hr at 12/21/23 0600   montelukast  (SINGULAIR ) tablet 10 mg  10 mg Oral QHS Patel, Vishal R, MD   10 mg at 12/20/23 2200   ondansetron  (ZOFRAN ) tablet 4 mg  4 mg Oral Q6H PRN Patel, Vishal R, MD       Or   ondansetron  (ZOFRAN ) injection 4 mg  4 mg Intravenous Q6H PRN Patel, Vishal R, MD       Oral care mouth rinse  15 mL Mouth Rinse PRN Tobie Jorie SAUNDERS, MD       pantoprazole  (PROTONIX ) EC tablet 40 mg  40 mg Oral Daily Krishnan, Gokul, MD   40 mg at 12/21/23 0946   polyethylene glycol (MIRALAX  / GLYCOLAX ) packet 17 g  17 g Oral BID Krishnan, Gokul, MD   17 g at 12/21/23 9057   rosuvastatin  (CRESTOR ) tablet 10 mg  10 mg Oral Daily Patel, Vishal R, MD   10 mg at 12/21/23 9057   senna-docusate (Senokot-S) tablet 2 tablet  2 tablet Oral BID Krishnan, Gokul, MD   2 tablet at 12/21/23 0940   sodium chloride  flush (NS) 0.9 % injection 10-40 mL  10-40 mL Intracatheter Q12H Verdene Purchase, MD   10 mL at 12/20/23 2339   sodium chloride  flush (NS) 0.9 % injection 10-40 mL  10-40 mL Intracatheter PRN Krishnan, Gokul, MD       sodium phosphate (FLEET) enema 1 enema  1 enema Rectal Daily PRN Krishnan, Gokul, MD         Discharge Medications: Please see discharge summary for a list of discharge medications.  Relevant Imaging Results:  Relevant Lab Results:   Additional Information SSN: [id number]  Jon ONEIDA Anon,  RN

## 2023-12-21 NOTE — Consult Note (Signed)
 Hospital Consult    Reason for Consult:  extensive bilateral lower extremity dvt Referring Physician:  Dr. Verdene MRN #:  [id number]  History of Present Illness: This is a 78 y.o. female without significant peripheral vascular history.  She states that she has never had blood clots in the past.  She has been recently debilitated not walking is much as in the past and has been in a SNF.  She is now here with diagnosis of pulmonary embolus.  She does have pain and swelling in her legs but states this has been present for approximately 7 years.  Past Medical History:  Diagnosis Date   Allergy    Arthritis    Asthma    CAD (coronary artery disease)    Chronic headaches    Depression    Diabetes mellitus, type II (HCC)    Diverticulosis    Endometriosis    GERD (gastroesophageal reflux disease)    Heart attack (HCC)    High cholesterol    Hypertension    Mallory-Weiss tear 2003   Mood swings    Rheumatoid arthritis (HCC)    Vitamin D deficiency     Past Surgical History:  Procedure Laterality Date   ABDOMINAL HYSTERECTOMY     EXPLORATORY LAPAROTOMY     I & D EXTREMITY Left 08/04/2017   Procedure: IRRIGATION AND DEBRIDEMENT HAND;  Surgeon: Murrell Drivers, MD;  Location: [facility] OR;  Service: Orthopedics;  Laterality: Left;   LEFT HEART CATHETERIZATION WITH CORONARY ANGIOGRAM N/A 04/10/2013   Procedure: LEFT HEART CATHETERIZATION WITH CORONARY ANGIOGRAM;  Surgeon: Salena GORMAN Negri, MD;  Location: [facility] CATH LAB;  Service: Cardiovascular;  Laterality: N/A;    No Known Allergies  Prior to Admission medications   Medication Sig Start Date End Date Taking? Authorizing Provider  albuterol  (VENTOLIN  HFA) 108 (90 Base) MCG/ACT inhaler Inhale 2 puffs into the lungs every 6 (six) hours as needed for wheezing or shortness of breath. 03/09/23  Yes Lorin Norris, MD  amLODipine  (NORVASC ) 10 MG tablet Take 10 mg by mouth daily.   Yes [provider]  aspirin  EC 325 MG tablet Take 325 mg by  mouth daily.   Yes [provider]  omeprazole  (PRILOSEC) 20 MG capsule Take 20 mg by mouth daily. 12/18/23  Yes [provider]  TRELEGY ELLIPTA 100-62.5-25 MCG/ACT AEPB Inhale 1 puff into the lungs daily. 12/18/23  Yes [provider]  amLODipine  (NORVASC ) 5 MG tablet Take 1.5 tablets (7.5 mg total) by mouth daily. Patient not taking: Reported on 12/20/2023 09/11/23   Wyn Jackee VEAR Mickey., NP  aspirin  EC 81 MG tablet Take 1 tablet (81 mg total) by mouth daily. Swallow whole. Patient not taking: Reported on 10/31/2023 07/14/22   Vannie Reche GORMAN, NP  atorvastatin  (LIPITOR) 20 MG tablet Take 20 mg by mouth daily. 12/18/23   [provider]  benztropine  (COGENTIN ) 0.5 MG tablet Take 0.5 mg by mouth 2 (two) times daily. 10/12/23   [provider]  Budeson-Glycopyrrol-Formoterol  (BREZTRI  AEROSPHERE) 160-9-4.8 MCG/ACT AERO Inhale 2 puffs into the lungs in the morning and at bedtime. Patient taking differently: Inhale 2 puffs into the lungs daily. 03/09/23   Lorin Norris, MD  buPROPion  (WELLBUTRIN  XL) 150 MG 24 hr tablet Take 150 mg by mouth at bedtime. 03/22/22   [provider]  diazepam  (VALIUM ) 2 MG tablet Take 1 tablet (2 mg total) by mouth every 6 (six) hours as needed for muscle spasms. 11/24/23   Dasie Faden, MD  esomeprazole  (  NEXIUM ) 40 MG capsule Take 1 capsule (40 mg total) by mouth daily at 12 noon. 07/31/22   Walker, Caitlin S, NP  fluPHENAZine  (PROLIXIN ) 5 MG tablet Take 5 mg by mouth 3 (three) times daily.    [provider]  furosemide  (LASIX ) 40 MG tablet Take 40 mg by mouth at bedtime.    [provider]  gabapentin  (NEURONTIN ) 400 MG capsule Take 400-800 mg by mouth See admin instructions. Take 800mg  (2 capsules) by mouth every morning and an additional 400mg  (1 capsule) as needed. 05/07/22   [provider]  guaifenesin (ROBITUSSIN) 100 MG/5ML syrup Take 200 mg by mouth 3 (three) times daily as needed for cough.     [provider]  meclizine  (ANTIVERT ) 12.5 MG tablet Take 1 tablet (12.5 mg total) by mouth 3 (three) times daily as needed for dizziness. Patient taking differently: Take 12.5 mg by mouth at bedtime. 11/12/20   Laurice Maude BROCKS, MD  metoprolol  succinate (TOPROL -XL) 50 MG 24 hr tablet Take 50 mg by mouth daily. 10/30/23   [provider]  montelukast  (SINGULAIR ) 10 MG tablet Take 1 tablet (10 mg total) by mouth at bedtime. 01/12/23   Lorin Norris, MD  nitroGLYCERIN  (NITROSTAT ) 0.3 MG SL tablet Place 0.3 mg under the tongue every 5 (five) minutes as needed for chest pain. 12/17/19   [provider]  oxyCODONE -acetaminophen  (PERCOCET/ROXICET) 5-325 MG tablet Take 1 tablet by mouth every 6 (six) hours as needed for severe pain (pain score 7-10). 11/24/23   Dasie Faden, MD  oxymetazoline (AFRIN) 0.05 % nasal spray Place 2-3 sprays into both nostrils daily.    [provider]  rosuvastatin  (CRESTOR ) 10 MG tablet Take 1 tablet (10 mg total) by mouth daily. 09/11/23 09/05/24  Wyn Jackee VEAR Mickey., NP  valbenazine  (INGREZZA ) 80 MG capsule Take 80 mg by mouth 2 (two) times daily.    [provider]  Vitamin D, Ergocalciferol, (DRISDOL) 1.25 MG (50000 UNIT) CAPS capsule Take 50,000 Units by mouth once a week. 05/07/22   [provider]    Social History   Socioeconomic History   Marital status: Divorced    Spouse name: Not on file   Number of children: Not on file   Years of education: Not on file   Highest education level: Not on file  Occupational History   Not on file  Tobacco Use   Smoking status: Former    Types: Cigarettes   Smokeless tobacco: Never  Vaping Use   Vaping status: Never Used  Substance and Sexual Activity   Alcohol use: Yes    Comment: occasional   Drug use: No   Sexual activity: Never  Other Topics Concern   Not on file  Social History Narrative   Not on file   Social Drivers of Health   Financial Resource Strain:  Not on file  Food Insecurity: No Food Insecurity (12/19/2023)   Hunger Vital Sign    Worried About Running Out of Food in the Last Year: Never true    Ran Out of Food in the Last Year: Never true  Transportation Needs: No Transportation Needs (12/19/2023)   PRAPARE - Administrator, Civil Service (Medical): No    Lack of Transportation (Non-Medical): No  Physical Activity: Not on file  Stress: Not on file  Social Connections: Socially Isolated (12/19/2023)   Social Connection and Isolation Panel    Frequency of Communication with Friends and Family: Once a week  Frequency of Social Gatherings with Friends and Family: Never    Attends Religious Services: Never    Database administrator or Organizations: No    Attends Banker Meetings: Never    Marital Status: Divorced  Catering manager Violence: Not At Risk (12/19/2023)   Humiliation, Afraid, Rape, and Kick questionnaire    Fear of Current or Ex-Partner: No    Emotionally Abused: No    Physically Abused: No    Sexually Abused: No     Family History  Problem Relation Age of Onset   Cancer Mother    Heart disease Mother    Cancer Father    Heart disease Father    Colon cancer Neg Hx    Rectal cancer Neg Hx    Stomach cancer Neg Hx     Review of Systems  Constitutional: Negative.   HENT: Negative.    Respiratory:  Positive for shortness of breath.   Cardiovascular:  Positive for chest pain and leg swelling.  Musculoskeletal: Negative.        Leg pain  Skin: Negative.   Neurological: Negative.   Endo/Heme/Allergies: Negative.   Psychiatric/Behavioral: Negative.        Physical Examination  Vitals:   12/21/23 0700 12/21/23 0811  BP: 93/64   Pulse: 84   Resp: (!) 24   Temp:  98.8 F (37.1 C)  SpO2: 99%    Body mass index is 31.78 kg/m.  Physical Exam HENT:     Head: Normocephalic.     Nose: Nose normal.     Mouth/Throat:     Mouth: Mucous membranes are moist.  Eyes:     Pupils:  Pupils are equal, round, and reactive to light.  Cardiovascular:     Pulses:          Posterior tibial pulses are 2+ on the right side and 2+ on the left side.  Abdominal:     General: Abdomen is flat.  Musculoskeletal:     Cervical back: Normal range of motion.     Right lower leg: Edema present.     Left lower leg: Edema present.  Skin:    General: Skin is warm.  Neurological:     General: No focal deficit present.     Mental Status: She is alert.  Psychiatric:        Mood and Affect: Mood normal.      CBC    Component Value Date/Time   WBC 6.9 12/21/2023 0541   RBC 3.34 (L) 12/21/2023 0541   HGB 10.4 (L) 12/21/2023 0541   HGB 14.1 03/09/2023 1159   HCT 29.0 (L) 12/21/2023 0541   HCT 42.2 03/09/2023 1159   PLT 246 12/21/2023 0541   PLT 307 03/09/2023 1159   MCV 86.8 12/21/2023 0541   MCV 97 03/09/2023 1159   MCH 31.1 12/21/2023 0541   MCHC 35.9 12/21/2023 0541   RDW 13.1 12/21/2023 0541   RDW 13.7 03/09/2023 1159   LYMPHSABS 2.2 11/26/2023 2025   LYMPHSABS 2.4 03/09/2023 1159   MONOABS 0.7 11/26/2023 2025   EOSABS 0.1 11/26/2023 2025   EOSABS 0.1 03/09/2023 1159   BASOSABS 0.0 11/26/2023 2025   BASOSABS 0.0 03/09/2023 1159    BMET    Component Value Date/Time   NA 136 12/21/2023 0541   NA 141 07/03/2022 0923   K 3.5 12/21/2023 0541   CL 105 12/21/2023 0541   CO2 21 (L) 12/21/2023 0541   GLUCOSE 103 (H) 12/21/2023 0541  BUN 9 12/21/2023 0541   BUN 16 07/03/2022 0923   CREATININE 0.84 12/21/2023 0541   CALCIUM  8.2 (L) 12/21/2023 0541   GFRNONAA >60 12/21/2023 0541   GFRAA 54 (L) 02/03/2019 1110    COAGS: Lab Results  Component Value Date   INR 1.2 12/20/2023   INR 1.1 11/12/2020   INR 1.1 06/12/2020     Non-Invasive Vascular Imaging:   +---------+---------------+---------+-----------+----------+---------------  ----+  RIGHT   CompressibilityPhasicitySpontaneityPropertiesThrombus Aging         +---------+---------------+---------+-----------+----------+---------------  ----+  CFV     Partial        Yes      Yes                  Acute                 +---------+---------------+---------+-----------+----------+---------------  ----+  SFJ     Partial                                      Acute                 +---------+---------------+---------+-----------+----------+---------------  ----+  FV Prox  Partial        Yes      Yes                  Acute                 +---------+---------------+---------+-----------+----------+---------------  ----+  FV Mid   None           No       No                   Acute                 +---------+---------------+---------+-----------+----------+---------------  ----+  FV DistalNone           No       No                   Acute                 +---------+---------------+---------+-----------+----------+---------------  ----+  PFV     Full                                                               +---------+---------------+---------+-----------+----------+---------------  ----+  POP     None           No       No                   Acute                 +---------+---------------+---------+-----------+----------+---------------  ----+  PTV     Full                                                               +---------+---------------+---------+-----------+----------+---------------  ----+  PERO  Not well  visualized  +---------+---------------+---------+-----------+----------+---------------  ----+  Gastroc Partial                                      Acute                 +---------+---------------+---------+-----------+----------+---------------  ----+  EIV                    Yes      Yes                                         +---------+---------------+---------+-----------+----------+---------------  ----+         +---------+---------------+---------+-----------+----------+---------------  ----+  LEFT    CompressibilityPhasicitySpontaneityPropertiesThrombus Aging        +---------+---------------+---------+-----------+----------+---------------  ----+  CFV     Partial        Yes      Yes                  Acute                 +---------+---------------+---------+-----------+----------+---------------  ----+  SFJ     None                                         Acute                 +---------+---------------+---------+-----------+----------+---------------  ----+  FV Prox  Full                                                               +---------+---------------+---------+-----------+----------+---------------  ----+  FV Mid   Full                                                               +---------+---------------+---------+-----------+----------+---------------  ----+  FV DistalFull                                                               +---------+---------------+---------+-----------+----------+---------------  ----+  PFV     Full                                                               +---------+---------------+---------+-----------+----------+---------------  ----+  POP     Full           Yes      Yes                                        +---------+---------------+---------+-----------+----------+---------------  ----+  PTV                                                  Not well  visualized  +---------+---------------+---------+-----------+----------+---------------  ----+  PERO                                                 Not well  visualized  +---------+---------------+---------+-----------+----------+---------------  ----+  EIV                    Yes      Yes                                         +---------+---------------+---------+-----------+----------+---------------  ----+              Summary:  RIGHT:  - Findings consistent with acute deep vein thrombosis involving the right  common femoral vein, SF junction, right femoral vein, and right popliteal  vein.  Findings consistent with acute intramuscular thrombosis involving the  right gastrocnemius veins.     - No cystic structure found in the popliteal fossa.    LEFT:  - Findings consistent with acute deep vein thrombosis involving the left  common femoral vein, and SF junction.  - Findings consistent with acute superficial vein thrombosis involving the  left great saphenous vein.     CT VENOGRAM ABDOMEN AND PELVIS AND LOWER EXTREMITY BILATERAL   TECHNIQUE: Venographic phase images of the abdomen, pelvis and lower extremities were obtained following the administration of intravenous contrast. Multiplanar reformats and maximum intensity projections were generated.   RADIATION DOSE REDUCTION: This exam was performed according to the departmental dose-optimization program which includes automated exposure control, adjustment of the mA and/or kV according to patient size and/or use of iterative reconstruction technique.   CONTRAST:  OMNIPAQUE  IOHEXOL  350 MG/ML SOLN   COMPARISON:  CT of the abdomen on 12/19/2023. Venous duplex ultrasound report on 12/20/2023.   FINDINGS: Lower chest: Upper most image shows a small amount of nonocclusive thrombus in a posterior left lower lobe pulmonary artery branch. No pleural effusions or consolidation at the lung bases.   Hepatobiliary: No focal liver abnormality is seen. No gallstones, gallbladder wall thickening, or biliary dilatation.   Pancreas: Unremarkable. No pancreatic ductal dilatation or surrounding inflammatory changes.   Spleen: Normal in size without focal abnormality.   Adrenals/Urinary Tract: Adrenal glands are  unremarkable. Kidneys are normal, without renal calculi, focal lesion, or hydronephrosis. Bladder is unremarkable.   Stomach/Bowel: Bowel shows no evidence of obstruction, ileus, inflammation or lesion. The appendix is not discretely visualized. No free intraperitoneal air.   Vascular/Lymphatic: No arterial vascular abnormalities identified. No lymphadenopathy.   Reproductive: Status post hysterectomy. No adnexal masses.   Other: No abdominal wall hernia or abnormality. No abdominopelvic ascites.   Musculoskeletal: No acute or significant osseous findings.   IVC: Normally patent and positioned IVC without evidence of thrombus, stenosis or mass effect.   Portal and mesenteric veins: Normally patent.   Bilateral iliac veins: Normally patent bilateral iliac veins. No evidence of thrombus, stenosis or mass effect.   Right lower  extremity: Nonocclusive thrombus within the inferior aspect of the common femoral vein extending into the right femoral vein. Thrombus visualized in the popliteal vein.   Left lower extremity: Nonocclusive thrombus in the common femoral vein extends into the saphenofemoral junction. No visualized left femoral or popliteal vein DVT.   IMPRESSION: 1. Nonocclusive thrombus in the inferior aspect of the right common femoral vein extending into the right femoral vein. Thrombus visualized in the right popliteal vein. 2. Nonocclusive thrombus in the left common femoral vein extends into the saphenofemoral junction. No visualized left femoral or popliteal vein DVT. 3. No evidence of iliac vein or IVC thrombus. 4. Small amount of nonocclusive thrombus in a posterior left lower lobe pulmonary artery branch as seen by prior abdominal CT.  ASSESSMENT/PLAN: This is a 78 y.o. female here with PE with recent history of immobility and admission to SNF.  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  Her legs do have mild edema although this may be chronic.  Her legs remain very well-perfused and as such I would recommend anticoagulation and she should not require any interventional treatment of her DVTs.  Vascular surgery will be available as needed.    Ronnika Collett C. Sheree, MD [facility] Office: [phone] Pager: [phone]

## 2023-12-21 NOTE — Progress Notes (Addendum)
 PHARMACY - ANTICOAGULATION CONSULT NOTE  Pharmacy Consult for Heparin  Indication:  pulmonary embolus w/ right heart strain & acute DVT in left common femoral vein  No Known Allergies  Patient Measurements: Height: 5' 1 (154.9 cm) Weight: 76.3 kg (168 lb 3.4 oz) IBW/kg (Calculated) : 47.8 HEPARIN  DW (KG): 64.7 Height = 61 Weight (11/26/23) = 86.6 kg, initially documented as the same here then documented as 76.3 kg Heparin  dosing weight = 67 kg  Vital Signs: Temp: 98.8 F (37.1 C) (08/15 0811) Temp Source: Axillary (08/15 0811) BP: 93/64 (08/15 0700) Pulse Rate: 84 (08/15 0700)  Labs: Recent Labs    12/19/23 1906 12/20/23 0017 12/20/23 9361 12/20/23 0822 12/20/23 1003 12/20/23 1505 12/20/23 2115 12/21/23 0541 12/21/23 0846  HGB 13.0 13.0  --   --   --   --   --  10.4*  --   HCT 37.0 36.2  --   --   --   --   --  29.0*  --   PLT 353 342  --   --   --   --   --  246  --   APTT  --  84*  --   --   --   --   --   --   --   LABPROT  --  16.0*  --   --   --   --   --   --   --   INR  --  1.2  --   --   --   --   --   --   --   HEPARINUNFRC  --   --    < >  --   --  0.39 0.28*  --  0.51  CREATININE 1.06* 0.97  --   --   --   --   --  0.84  --   TROPONINIHS  --   --   --  7 19*  --   --   --   --    < > = values in this interval not displayed.    Estimated Creatinine Clearance: 53.2 mL/min (by C-G formula based on SCr of 0.84 mg/dL).   Medical History: Past Medical History:  Diagnosis Date   Allergy    Arthritis    Asthma    CAD (coronary artery disease)    Chronic headaches    Depression    Diabetes mellitus, type II (HCC)    Diverticulosis    Endometriosis    GERD (gastroesophageal reflux disease)    Heart attack (HCC)    High cholesterol    Hypertension    Mallory-Weiss tear 2003   Mood swings    Rheumatoid arthritis (HCC)    Vitamin D deficiency     Medications:  No oral anticoagulation PTA  Assessment: 78 yr female brought to ED for nursing home  placement with c/o weakness. During medical workup to clear for facility placement, CTA with acute to subacute PE with evidence of right heart strain, and also found to have acute DVT in left common femoral vein. Pharmacy was consulted for heparin  management.  Today: -Heparin  level 0.51- therapeutic with heparin  infusing at 1250 units/hr - Hgb decreased to 10.4, no noted bleeding per MD note  -No complications of therapy noted  Goal of Therapy:  Heparin  level 0.3-0.7 units/ml Monitor platelets by anticoagulation protocol: Yes   Plan:  -Continue heparin  infusion at 1250 units/hr -Repeat 8 hour heparin  level to confirm therapeutic  level  -Daily heparin  level and CBC -F/u for transition to oral anticoagulant   Jetaun Colbath, PharmD, BCPS 12/21/2023 10:35 AM

## 2023-12-21 NOTE — Evaluation (Signed)
 Occupational Therapy Evaluation Patient Details Name: [name] MRN: [id number] DOB: 10-17-1945 Today's Date: 12/21/2023   History of Present Illness   [name] is a 78 yr old female who presents to therapy following hospital admission on 12/19/2023 due to pain all over including angina, SOB, productive cough, burning with urination and B LE edema. Pt found to have PE and started on heparin  drip, R heart strain and L femoral DVT. Pt had recent hospitalization 7/21-7/23 secondary to LBP and 6/24-6/28 both hospitalizations pt d/c to SNF.  Pt EFY:pwrolizd but is not limited to:  HTN, CVA with L sided deficits UE (finger contractures) and LE (hip and knee flexion contractures), diabetes, heart attack, GERD, RA chronic LBP.     Clinical Impressions The pt is currently presenting with the below listed deficits (see OT problem list) As such, her ADL performance is compromised and she requires assist for self-care management. During the session, she required max assist for supine to sit, max assist for simulated lower body dressing, mod assist x2 to stand from EOB using RW & Stedy device, and mod-max assist x2 to stand-pivot to the chair. She was able to maintain static standing with CGA to min assist for several seconds. She reported having difficulty with managing her daily activities recently, and her overall mobility has been limited. She was only home from SNF rehab for a few days before being admitted to the hospital. She has a history of CVA with resultant LUE and LLE weakness; stiffness and hypertonia of LUE and LLE noted. No functional AROM of LUE noted. She was also noted to be with occasional moments of confusion and difficulty with memory/recall. She will benefit from further OT services to maximize her safety and independence with self care tasks. Patient will benefit from continued inpatient follow up therapy, <3 hours/day.        If plan is discharge home, recommend the  following:   A lot of help with walking and/or transfers;A lot of help with bathing/dressing/bathroom;Assistance with cooking/housework;Direct supervision/assist for financial management;Direct supervision/assist for medications management;Assist for transportation     Functional Status Assessment   Patient has had a recent decline in their functional status and demonstrates the ability to make significant improvements in function in a reasonable and predictable amount of time.     Equipment Recommendations   Other (comment) (to be determined pending functional progress)     Recommendations for Other Services         Precautions/Restrictions   Precautions Precautions: Fall Restrictions Weight Bearing Restrictions Per Provider Order: No     Mobility Bed Mobility Overal bed mobility: Needs Assistance Bed Mobility: Supine to Sit     Supine to sit: HOB elevated, Used rails, Max assist, +2 for physical assistance          Transfers Overall transfer level: Needs assistance   Transfers: Sit to/from Stand Sit to Stand: Mod assist, +2 physical assistance, From elevated surface, Via lift equipment           General transfer comment:  (Initial use of RW to stand, then used Stedy for second stand. Pt unable to grasp walker using LUE, requiring manual assist in this regard.) Transfer via Lift Equipment: Stedy    Balance     Sitting balance-Leahy Scale: Fair       Standing balance-Leahy Scale: Poor            ADL either performed or assessed with clinical judgement   ADL Overall ADL's :  Needs assistance/impaired Eating/Feeding: Set up;Sitting   Grooming: Moderate assistance;Sitting Grooming Details (indicate cue type and reason): limited by LUE ROM and strength limitations         Upper Body Dressing : Sitting;Moderate assistance   Lower Body Dressing: Sitting/lateral leans;Maximal assistance       Toileting- Clothing Manipulation and Hygiene:  Maximal assistance Toileting - Clothing Manipulation Details (indicate cue type and reason): bedside commode level, based on clinical judgement              Pertinent Vitals/Pain Pain Assessment Pain Assessment: Faces Pain Score: 4  Pain Location: back Pain Descriptors / Indicators: Aching, Constant Pain Intervention(s): Limited activity within patient's tolerance, Monitored during session, Repositioned     Extremity/Trunk Assessment Upper Extremity Assessment Upper Extremity Assessment: Right hand dominant;LUE deficits/detail;RUE deficits/detail RUE Deficits / Details: AROM WFL. Functional grip strength LUE Deficits / Details: No functional AROM noted. Contractures of 5th digit noted.  Suspected hypertonia. Required PROM mostly LUE Coordination: decreased fine motor;decreased gross motor   Lower Extremity Assessment Lower Extremity Assessment: LLE deficits/detail;RLE deficits/detail RLE Deficits / Details: AROM WFL LLE Deficits / Details:  (Required AAROM. Knee and hip stiffness. Decreased AROM for dorsi-flexion.)       Communication Communication Communication: No apparent difficulties   Cognition Arousal: Alert Behavior During Therapy: WFL for tasks assessed/performed Cognition: Difficult to assess, No family/caregiver present to determine baseline             OT - Cognition Comments: She appeared to have moments of cognitive clarity & other times she was slightly confused and had difficulty with recall/memory.                 Following commands: Impaired Following commands impaired: Follows one step commands with increased time     Cueing  General Comments   Cueing Techniques: Verbal cues;Tactile cues;Gestural cues              Home Living Family/patient expects to be discharged to:: Private residence Living Arrangements: Alone Available Help at Discharge: Personal care attendant Type of Home: Apartment Home Access: Level entry     Home  Layout: One level     Bathroom Shower/Tub: Chief Strategy Officer: Standard     Home Equipment: Wheelchair - manual   Additional Comments:  (Pt is a questionable historian. Info reported should be verified.)      Prior Functioning/Environment Prior Level of Function : Independent/Modified Independent;Needs assist             Mobility Comments:  (She reported use of a walker for household ambulation, prior to June of this year. She has been using a wheelchair recently, and having difficulty with mobility.) ADLs Comments:  (She has a home health aide Mon-Fri for 2.5 to 4 hrs each day. Her aide assisted with lower body dressing, spongebaths, and cleaning. She orders food/meals with [employer]. She has had a lot of difficulty managing self-care for a couple months.)    OT Problem List: Decreased strength;Decreased range of motion;Impaired balance (sitting and/or standing);Decreased coordination;Decreased knowledge of use of DME or AE;Impaired tone;Impaired UE functional use   OT Treatment/Interventions: Self-care/ADL training;Therapeutic exercise;Therapeutic activities;Neuromuscular education;Energy conservation;Cognitive remediation/compensation;DME and/or AE instruction;Patient/family education;Balance training      OT Goals(Current goals can be found in the care plan section)   Acute Rehab OT Goals OT Goal Formulation: With patient Time For Goal Achievement: 01/04/24 Potential to Achieve Goals: Fair ADL Goals Pt Will Perform Upper Body Dressing: with min assist;sitting  Pt Will Perform Lower Body Dressing: with min assist;sitting/lateral leans;with adaptive equipment Pt Will Transfer to Toilet: with min assist;stand pivot transfer;bedside commode Additional ADL Goal #1: Pt will perform bed mobility with CGA, in prep for progressive ADL participation.   OT Frequency:  Min 2X/week    Co-evaluation PT/OT/SLP Co-Evaluation/Treatment: Yes Reason for Co-Treatment:  Complexity of the patient's impairments (multi-system involvement);Necessary to address cognition/behavior during functional activity;For patient/therapist safety PT goals addressed during session: Mobility/safety with mobility;Balance;Proper use of DME OT goals addressed during session: ADL's and self-care;Proper use of Adaptive equipment and DME      AM-PAC OT 6 Clicks Daily Activity     Outcome Measure Help from another person eating meals?: A Little Help from another person taking care of personal grooming?: A Little Help from another person toileting, which includes using toliet, bedpan, or urinal?: A Lot Help from another person bathing (including washing, rinsing, drying)?: A Lot Help from another person to put on and taking off regular upper body clothing?: A Lot Help from another person to put on and taking off regular lower body clothing?: A Lot 6 Click Score: 14   End of Session Equipment Utilized During Treatment: Gait belt;Other (comment) Laurent) Nurse Communication: Mobility status  Activity Tolerance: Patient tolerated treatment well Patient left: in chair;with call bell/phone within reach;with chair alarm set  OT Visit Diagnosis: Unsteadiness on feet (R26.81);Other abnormalities of gait and mobility (R26.89);Muscle weakness (generalized) (M62.81);Other symptoms and signs involving cognitive function;Hemiplegia and hemiparesis Hemiplegia - Right/Left: Left Hemiplegia - dominant/non-dominant: Non-Dominant                Time: 1431-1501 OT Time Calculation (min): 30 min Charges:  OT General Charges $OT Visit: 1 Visit OT Evaluation $OT Eval Moderate Complexity: 1 Mod    Emer Onnen J Harris, OTR/L 12/21/2023, 5:27 PM

## 2023-12-22 ENCOUNTER — Inpatient Hospital Stay (HOSPITAL_COMMUNITY)

## 2023-12-22 DIAGNOSIS — I2609 Other pulmonary embolism with acute cor pulmonale: Secondary | ICD-10-CM | POA: Diagnosis not present

## 2023-12-22 LAB — CBC
HCT: 31.8 % — ABNORMAL LOW (ref 36.0–46.0)
Hemoglobin: 10.9 g/dL — ABNORMAL LOW (ref 12.0–15.0)
MCH: 29.9 pg (ref 26.0–34.0)
MCHC: 34.3 g/dL (ref 30.0–36.0)
MCV: 87.4 fL (ref 80.0–100.0)
Platelets: 313 K/uL (ref 150–400)
RBC: 3.64 MIL/uL — ABNORMAL LOW (ref 3.87–5.11)
RDW: 13.1 % (ref 11.5–15.5)
WBC: 7.1 K/uL (ref 4.0–10.5)
nRBC: 0 % (ref 0.0–0.2)

## 2023-12-22 LAB — FOLATE: Folate: 12.4 ng/mL (ref >5.9)

## 2023-12-22 LAB — RETICULOCYTES
Immature Retic Fract: 25.3 % — ABNORMAL HIGH (ref 2.3–15.9)
RBC.: 3.55 MIL/uL — ABNORMAL LOW (ref 3.87–5.11)
Retic Count, Absolute: 50.1 K/uL (ref 19.0–186.0)
Retic Ct Pct: 1.4 % (ref 0.4–3.1)

## 2023-12-22 LAB — BASIC METABOLIC PANEL WITH GFR
Anion gap: 13 (ref 5–15)
BUN: 7 mg/dL — ABNORMAL LOW (ref 8–23)
CO2: 20 mmol/L — ABNORMAL LOW (ref 22–32)
Calcium: 9.1 mg/dL (ref 8.9–10.3)
Chloride: 103 mmol/L (ref 98–111)
Creatinine, Ser: 0.75 mg/dL (ref 0.44–1.00)
GFR, Estimated: >60 mL/min (ref >60)
Glucose, Bld: 105 mg/dL — ABNORMAL HIGH (ref 70–99)
Potassium: 3.8 mmol/L (ref 3.5–5.1)
Sodium: 136 mmol/L (ref 135–145)

## 2023-12-22 LAB — IRON AND TIBC
Iron: 49 ug/dL (ref 28–170)
Saturation Ratios: 27 % (ref 10.4–31.8)
TIBC: 181 ug/dL — ABNORMAL LOW (ref 250–450)
UIBC: 132 ug/dL

## 2023-12-22 LAB — HEPARIN LEVEL (UNFRACTIONATED): Heparin Unfractionated: 0.49 [IU]/mL (ref 0.30–0.70)

## 2023-12-22 LAB — FERRITIN: Ferritin: 310 ng/mL — ABNORMAL HIGH (ref 11–307)

## 2023-12-22 LAB — VITAMIN B12: Vitamin B-12: 622 pg/mL (ref 180–914)

## 2023-12-22 MED ORDER — APIXABAN 5 MG PO TABS
10.0000 mg | ORAL_TABLET | Freq: Two times a day (BID) | ORAL | Status: AC
  Administered 2023-12-22 – 2023-12-28 (×14): 10 mg via ORAL
  Filled 2023-12-22 (×14): qty 2

## 2023-12-22 MED ORDER — IBUPROFEN 200 MG PO TABS
400.0000 mg | ORAL_TABLET | Freq: Four times a day (QID) | ORAL | Status: DC | PRN
  Administered 2023-12-22 – 2024-01-01 (×4): 400 mg via ORAL
  Filled 2023-12-22 (×4): qty 2

## 2023-12-22 MED ORDER — DICLOFENAC SODIUM 1 % EX GEL
2.0000 g | Freq: Four times a day (QID) | CUTANEOUS | Status: DC
  Administered 2023-12-22 – 2024-01-01 (×32): 2 g via TOPICAL
  Filled 2023-12-22: qty 100

## 2023-12-22 MED ORDER — APIXABAN 5 MG PO TABS
5.0000 mg | ORAL_TABLET | Freq: Two times a day (BID) | ORAL | Status: DC
  Administered 2023-12-29 – 2024-01-01 (×7): 5 mg via ORAL
  Filled 2023-12-22 (×7): qty 1

## 2023-12-22 MED ORDER — ACETAMINOPHEN 500 MG PO TABS: 1000.0000 mg | ORAL_TABLET | Freq: Four times a day (QID) | ORAL | Status: DC | PRN

## 2023-12-22 MED ORDER — BISACODYL 10 MG RE SUPP
10.0000 mg | Freq: Once | RECTAL | Status: AC
  Administered 2023-12-22: 10 mg via RECTAL
  Filled 2023-12-22: qty 1

## 2023-12-22 MED ORDER — METOPROLOL SUCCINATE ER 25 MG PO TB24
25.0000 mg | ORAL_TABLET | Freq: Every day | ORAL | Status: DC
  Administered 2023-12-23 – 2023-12-31 (×9): 25 mg via ORAL
  Filled 2023-12-22 (×10): qty 1

## 2023-12-22 NOTE — Discharge Instructions (Signed)
 Information on my medicine - ELIQUIS  (apixaban )  This medication education was reviewed with me or my healthcare representative as part of my discharge preparation.  The pharmacist that spoke with me during my hospital stay was:  Sharlot Wanda Norris, [facility]  Why was Eliquis  prescribed for you? Eliquis  was prescribed to treat blood clots that may have been found in the veins of your legs (deep vein thrombosis) or in your lungs (pulmonary embolism) and to reduce the risk of them occurring again.  What do You need to know about Eliquis  ? The starting dose is 10 mg (two 5 mg tablets) taken TWICE daily for the FIRST SEVEN (7) DAYS, then on 12/29/23  the dose is reduced to ONE 5 mg tablet taken TWICE daily.  Eliquis  may be taken with or without food.   Try to take the dose about the same time in the morning and in the evening. If you have difficulty swallowing the tablet whole please discuss with your pharmacist how to take the medication safely.  Take Eliquis  exactly as prescribed and DO NOT stop taking Eliquis  without talking to the doctor who prescribed the medication.  Stopping may increase your risk of developing a new blood clot.  Refill your prescription before you run out.  After discharge, you should have regular check-up appointments with your healthcare provider that is prescribing your Eliquis .    What do you do if you miss a dose? If a dose of ELIQUIS  is not taken at the scheduled time, take it as soon as possible on the same day and twice-daily administration should be resumed. The dose should not be doubled to make up for a missed dose.  Important Safety Information A possible side effect of Eliquis  is bleeding. You should call your healthcare provider right away if you experience any of the following: Bleeding from an injury or your nose that does not stop. Unusual colored urine (red or dark brown) or unusual colored stools (red or black). Unusual bruising for unknown  reasons. A serious fall or if you hit your head (even if there is no bleeding).  Some medicines may interact with Eliquis  and might increase your risk of bleeding or clotting while on Eliquis . To help avoid this, consult your healthcare provider or pharmacist prior to using any new prescription or non-prescription medications, including herbals, vitamins, non-steroidal anti-inflammatory drugs (NSAIDs) and supplements.  This website has more information on Eliquis  (apixaban ): [email or web address] .[email or web address] dena

## 2023-12-22 NOTE — Progress Notes (Addendum)
 PHARMACY - ANTICOAGULATION CONSULT NOTE  Pharmacy Consult for Heparin  Indication:  pulmonary embolus and DVT  No Known Allergies  Patient Measurements: Height: 5' 1 (154.9 cm) Weight: 76.3 kg (168 lb 3.4 oz) IBW/kg (Calculated) : 47.8 HEPARIN  DW (KG): 64.7  Vital Signs: Temp: 97.4 F (36.3 C) (08/16 0700) Temp Source: Oral (08/16 0700) BP: 123/75 (08/16 0600) Pulse Rate: 95 (08/16 0600)  Labs: Recent Labs    12/20/23 0017 12/20/23 9361 12/20/23 0822 12/20/23 1003 12/20/23 1505 12/21/23 0541 12/21/23 0846 12/21/23 1921 12/22/23 0303  HGB 13.0  --   --   --   --  10.4*  --   --  10.9*  HCT 36.2  --   --   --   --  29.0*  --   --  31.8*  PLT 342  --   --   --   --  246  --   --  313  APTT 84*  --   --   --   --   --   --   --   --   LABPROT 16.0*  --   --   --   --   --   --   --   --   INR 1.2  --   --   --   --   --   --   --   --   HEPARINUNFRC  --    < >  --   --    < >  --  0.51 0.34 0.49  CREATININE 0.97  --   --   --   --  0.84  --   --  0.75  TROPONINIHS  --   --  7 19*  --   --   --   --   --    < > = values in this interval not displayed.    Estimated Creatinine Clearance: 55.9 mL/min (by C-G formula based on SCr of 0.75 mg/dL).   Medical History: Past Medical History:  Diagnosis Date   Allergy    Arthritis    Asthma    CAD (coronary artery disease)    Chronic headaches    Depression    Diabetes mellitus, type II (HCC)    Diverticulosis    Endometriosis    GERD (gastroesophageal reflux disease)    Heart attack (HCC)    High cholesterol    Hypertension    Mallory-Weiss tear 2003   Mood swings    Rheumatoid arthritis (HCC)    Vitamin D deficiency     Medications:  No oral anticoagulation PTA  Assessment: 78 yr female brought to ED for nursing home placement with c/o weakness. During medical workup to clear for facility placement, CTA with acute to subacute PE with evidence of right heart strain, and also found to have acute DVT in left  common femoral vein. Pharmacy was consulted for heparin  management.  Today, 12/22/2023:  Heparin  level 0.49, therapeutic on heparin  1250 units/hr CBC: Hgb remains low/stable at 10.9, Plt WNL No bleeding or complications reported Note vascular surgery not planning any intervention at this time.  Goal of Therapy:  Heparin  level 0.3-0.7 units/ml Monitor platelets by anticoagulation protocol: Yes   Plan:  -Continue heparin  infusion at 1250 units/hr -Daily heparin  level and CBC -F/u for transition to oral anticoagulant   Ariannah Arenson PharmD, BCPS [facility] pharmacy [phone] 12/22/2023 7:14 AM      Addendum:  9:10 AM Pharmacy is consulted  to dose apixaban . Stop heparin  when apixaban  is started.  Apixaban  10mg  PO BID x7 days, followed by 5mg  PO BID. Pharmacy to provide education prior to discharge.  Wanda Hasting PharmD, BCPS [facility] pharmacy [phone] 12/22/2023 9:09 AM

## 2023-12-22 NOTE — Progress Notes (Signed)
 [facility] PROGRESS NOTE   [name] FMW:[id number] DOB: [birth year] DOA: 12/19/2023  PCP: Rosalea Rosina SAILOR, PA  Brief History: 78 y.o. female with medical history significant for CVA, nonobstructive CAD, HTN, HLD, asthma, GERD, RA who presented to the ED for evaluation of abdominal pain and shortness of breath. Patient states that she is having some burning sensation with urination. Patient recently admitted 6/24-6/28 for generalized weakness felt due to physical deconditioning/chronic debility.  Imaging was without evidence of acute abnormality in the spine, cord compression, or high-grade stenosis.  It did mention left foraminal to extraforaminal disc protrusion at L2-3 potentially affecting the exiting left L2 nerve root.  Patient was ultimately discharged to SNF per PT/OT recommendation. After discharge from SNF to home patient was again seen in ED on 7/21 with acute on chronic back pain interfering with mobility and ADLs.  She boarded in the ED and was discharged to SNF on 7/23.  Patient returned to home about 6 days prior to this admission.  She says she lives alone and usually ambulates with use of a cane.  Evaluation in the ED reviewed acute pulmonary embolism.  She was hospitalized for further management.  Consultants: Pulmonology.  Vascular surgery, Dr. Sheree  Procedures: Echocardiogram.  Lower extremity Doppler.    Subjective/Interval History: No complaints.   Assessment/Plan:  Acute versus subacute pulmonary embolism/acute DVT The only risk factor identified is sedentary lifestyle for the last several weeks due to her back issue.  Patient denies any history of cancer.  No recent travel. Patient is seen by pulmonology.  No plans for thrombolytics for her acute PE. Episodes of hypotension noted overnight which stabilized with IV fluids. Echocardiogram shows normal systolic function.  Right ventricular systolic function is noted to be normal. Lower extremity  Doppler study shows extensive DVT involving both extremities. Patient denies any significant pain in the legs but the legs are swollen. Vascular surgery does not recommend any interventions Transition from Heparin  to Eliquis  today  Hypotension with h/o HTN Antihypertensives on hold Improved after fluid bolus  Resume Toprol  XL 25 min instead of 50 mg tomorrow AM Hold amlodipine  & furosemide  due to soft blood pressures.     Acute UTI She was given Diflucan  due to presence of yeast on the UA.  U culture show < 10,000 colonies  DC CTX  Normocytic anemia Drop in hemoglobin is noted.  No evidence of overt bleeding.    No deficiencies noted on anemia panel  History of coronary artery disease/history of stroke/hyperlipidemia Aspirin  on hold.  Continue statin.  Chronic back pain PT and OT recommending SNF   History of asthma Continue home medications.  Obesity Estimated body mass index is 31.78 kg/m as calculated from the following:   Height as of this encounter: 5' 1 (1.549 m).   Weight as of this encounter: 76.3 kg.   DVT Prophylaxis: Eliquis  Code Status: Full code Family Communication: Discussed with patient Disposition Plan: Medically stable for SNF     Medications: Scheduled:  apixaban   10 mg Oral BID   Followed by   NOREEN ON 12/29/2023] apixaban   5 mg Oral BID   benztropine   0.5 mg Oral BID   budesonide -glycopyrrolate -formoterol   2 puff Inhalation Daily   buPROPion   150 mg Oral Daily   Chlorhexidine  Gluconate Cloth  6 each Topical Daily   diclofenac  Sodium  2 g Topical QID   fluPHENAZine   5 mg Oral TID   montelukast   10 mg Oral QHS   pantoprazole   40 mg Oral Daily   polyethylene glycol  17 g Oral BID   rosuvastatin   10 mg Oral Daily   senna-docusate  2 tablet Oral BID   sodium chloride  flush  10-40 mL Intracatheter Q12H   Continuous:   PRN:acetaminophen , albuterol , bisacodyl , ibuprofen , ondansetron  **OR** ondansetron  (ZOFRAN ) IV, mouth rinse, sodium  chloride flush, sodium phosphate  Antibiotics: Anti-infectives (From admission, onward)    Start     Dose/Rate Route Frequency Ordered Stop   12/20/23 1015  fluconazole  (DIFLUCAN ) tablet 200 mg        200 mg Oral  Once 12/20/23 0926 12/20/23 0944   12/20/23 1000  fluconazole  (DIFLUCAN ) tablet 200 mg  Status:  Discontinued        200 mg Oral Daily 12/20/23 0512 12/20/23 0926   12/20/23 1000  cefTRIAXone  (ROCEPHIN ) 1 g in sodium chloride  0.9 % 100 mL IVPB  Status:  Discontinued        1 g 200 mL/hr over 30 Minutes Intravenous Every 24 hours 12/20/23 0916 12/22/23 0817       Objective:  Vital Signs  Vitals:   12/22/23 1400 12/22/23 1500 12/22/23 1600 12/22/23 1700  BP: 132/71 124/75 (!) 139/95 (!) 149/85  Pulse:  (!) 104  (!) 111  Resp: (!) 21 (!) 23 (!) 28 20  Temp:   97.6 F (36.4 C)   TempSrc:   Oral   SpO2:  94% 94% 96%  Weight:      Height:        Intake/Output Summary (Last 24 hours) at 12/22/2023 1756 Last data filed at 12/22/2023 1600 Gross per 24 hour  Intake 513.01 ml  Output 400 ml  Net 113.01 ml   Filed Weights   12/19/23 2208 12/19/23 2339  Weight: 86.9 kg 76.3 kg    General appearance: Awake alert.  In no distress Resp: Clear to auscultation bilaterally.  Normal effort Cardio: S1-S2 is normal regular.  No S3-S4.  No rubs murmurs or bruit GI: Abdomen is soft.  Nontender nondistended.  Bowel sounds are present normal.  No masses organomegaly Extremities:  lower extremity swelling is noted bilaterally.  Physical deconditioning noted.  Decreased range of motion.  Lab Results:  Data Reviewed: I have personally reviewed following labs and reports of the imaging studies  CBC: Recent Labs  Lab 12/19/23 1906 12/20/23 0017 12/21/23 0541 12/22/23 0303  WBC 9.0 8.9 6.9 7.1  HGB 13.0 13.0 10.4* 10.9*  HCT 37.0 36.2 29.0* 31.8*  MCV 86.4 86.8 86.8 87.4  PLT 353 342 246 313    Basic Metabolic Panel: Recent Labs  Lab 12/19/23 1906 12/20/23 0017  12/21/23 0541 12/22/23 0303  NA 133* 135 136 136  K 3.6 3.7 3.5 3.8  CL 98 97* 105 103  CO2 24 21* 21* 20*  GLUCOSE 120* 109* 103* 105*  BUN 16 15 9  7*  CREATININE 1.06* 0.97 0.84 0.75  CALCIUM  9.0 9.4 8.2* 9.1  MG  --   --  2.0  --     GFR: Estimated Creatinine Clearance: 55.9 mL/min (by C-G formula based on SCr of 0.75 mg/dL).  Liver Function Tests: Recent Labs  Lab 12/19/23 1906  AST 18  ALT 11  ALKPHOS 64  BILITOT 1.2  PROT 7.1  ALBUMIN 3.4*    Recent Labs  Lab 12/19/23 1906  LIPASE 30    Coagulation Profile: Recent Labs  Lab 12/20/23 0017  INR 1.2    Recent Results (from the past 240 hours)  MRSA Next  Gen by PCR, Nasal     Status: None   Collection Time: 12/19/23 10:59 PM   Specimen: Nasal Mucosa; Nasal Swab  Result Value Ref Range Status   MRSA by PCR Next Gen NOT DETECTED NOT DETECTED Final    Comment: (NOTE) The GeneXpert MRSA Assay (FDA approved for NASAL specimens only), is one component of a comprehensive MRSA colonization surveillance program. It is not intended to diagnose MRSA infection nor to guide or monitor treatment for MRSA infections. Test performance is not FDA approved in patients less than 60 years old. Performed at [facility], [address], [place], [place] [address]   Urine Culture (for pregnant, neutropenic or urologic patients or patients with an indwelling urinary catheter)     Status: Abnormal   Collection Time: 12/20/23  3:30 AM   Specimen: Urine, Clean Catch  Result Value Ref Range Status   Specimen Description   Final    URINE, CLEAN CATCH Performed at [facility], [address] [place], [place] [address]    Special Requests   Final    NONE Performed at [facility], [address] [place], [place] [address]    Culture (A)  Final    <10,000 COLONIES/mL INSIGNIFICANT GROWTH Performed at [facility], [address] [place], [place] [address]     Report Status 12/21/2023 FINAL  Final      Radiology Studies: CT VENOGRAM ABD/PELVIS/LOWER EXT BILAT Result Date: 12/21/2023 CLINICAL DATA:  Bilateral DVT by lower extremity venous duplex ultrasound extending to the common femoral vein level bilaterally. Left lower lobe pulmonary embolism by recent CT of the abdomen. EXAM: CT VENOGRAM ABDOMEN AND PELVIS AND LOWER EXTREMITY BILATERAL TECHNIQUE: Venographic phase images of the abdomen, pelvis and lower extremities were obtained following the administration of intravenous contrast. Multiplanar reformats and maximum intensity projections were generated. RADIATION DOSE REDUCTION: This exam was performed according to the departmental dose-optimization program which includes automated exposure control, adjustment of the mA and/or kV according to patient size and/or use of iterative reconstruction technique. CONTRAST:  OMNIPAQUE  IOHEXOL  350 MG/ML SOLN COMPARISON:  CT of the abdomen on 12/19/2023. Venous duplex ultrasound report on 12/20/2023. FINDINGS: Lower chest: Upper most image shows a small amount of nonocclusive thrombus in a posterior left lower lobe pulmonary artery branch. No pleural effusions or consolidation at the lung bases. Hepatobiliary: No focal liver abnormality is seen. No gallstones, gallbladder wall thickening, or biliary dilatation. Pancreas: Unremarkable. No pancreatic ductal dilatation or surrounding inflammatory changes. Spleen: Normal in size without focal abnormality. Adrenals/Urinary Tract: Adrenal glands are unremarkable. Kidneys are normal, without renal calculi, focal lesion, or hydronephrosis. Bladder is unremarkable. Stomach/Bowel: Bowel shows no evidence of obstruction, ileus, inflammation or lesion. The appendix is not discretely visualized. No free intraperitoneal air. Vascular/Lymphatic: No arterial vascular abnormalities identified. No lymphadenopathy. Reproductive: Status post hysterectomy. No adnexal masses. Other: No  abdominal wall hernia or abnormality. No abdominopelvic ascites. Musculoskeletal: No acute or significant osseous findings. IVC: Normally patent and positioned IVC without evidence of thrombus, stenosis or mass effect. Portal and mesenteric veins: Normally patent. Bilateral iliac veins: Normally patent bilateral iliac veins. No evidence of thrombus, stenosis or mass effect. Right lower extremity: Nonocclusive thrombus within the inferior aspect of the common femoral vein extending into the right femoral vein. Thrombus visualized in the popliteal vein. Left lower extremity: Nonocclusive thrombus in the common femoral vein extends into the saphenofemoral junction. No visualized left femoral or popliteal vein DVT.  IMPRESSION: 1. Nonocclusive thrombus in the inferior aspect of the right common femoral vein extending into the right femoral vein. Thrombus visualized in the right popliteal vein. 2. Nonocclusive thrombus in the left common femoral vein extends into the saphenofemoral junction. No visualized left femoral or popliteal vein DVT. 3. No evidence of iliac vein or IVC thrombus. 4. Small amount of nonocclusive thrombus in a posterior left lower lobe pulmonary artery branch as seen by prior abdominal CT. Electronically Signed   By: Marcey Moan M.D.   On: 12/21/2023 15:56       LOS: 3 days   Manjot Beumer  [facility] Pager on [email or web address]  12/22/2023, 5:56 PM

## 2023-12-23 DIAGNOSIS — I2609 Other pulmonary embolism with acute cor pulmonale: Secondary | ICD-10-CM | POA: Diagnosis not present

## 2023-12-23 LAB — CBC
HCT: 31.2 % — ABNORMAL LOW (ref 36.0–46.0)
Hemoglobin: 10.7 g/dL — ABNORMAL LOW (ref 12.0–15.0)
MCH: 29.9 pg (ref 26.0–34.0)
MCHC: 34.3 g/dL (ref 30.0–36.0)
MCV: 87.2 fL (ref 80.0–100.0)
Platelets: 349 K/uL (ref 150–400)
RBC: 3.58 MIL/uL — ABNORMAL LOW (ref 3.87–5.11)
RDW: 13.2 % (ref 11.5–15.5)
WBC: 7.1 K/uL (ref 4.0–10.5)
nRBC: 0 % (ref 0.0–0.2)

## 2023-12-23 MED ORDER — CHLORHEXIDINE GLUCONATE 0.12 % MT SOLN
15.0000 mL | Freq: Two times a day (BID) | OROMUCOSAL | Status: DC
  Administered 2023-12-23 – 2024-01-01 (×14): 15 mL via OROMUCOSAL
  Filled 2023-12-23 (×12): qty 15

## 2023-12-23 NOTE — Progress Notes (Signed)
 [facility] PROGRESS NOTE   [name] FMW:[id number] DOB: [birth year] DOA: 12/19/2023  PCP: Rosalea Rosina SAILOR, PA  Brief History: 78 y.o. female with medical history significant for CVA, nonobstructive CAD, HTN, HLD, asthma, GERD, RA who presented to the ED for evaluation of abdominal pain and shortness of breath. Patient states that she is having some burning sensation with urination. Patient recently admitted 6/24-6/28 for generalized weakness felt due to physical deconditioning/chronic debility.  Imaging was without evidence of acute abnormality in the spine, cord compression, or high-grade stenosis.  It did mention left foraminal to extraforaminal disc protrusion at L2-3 potentially affecting the exiting left L2 nerve root.  Patient was ultimately discharged to SNF per PT/OT recommendation. After discharge from SNF to home patient was again seen in ED on 7/21 with acute on chronic back pain interfering with mobility and ADLs.  She boarded in the ED and was discharged to SNF on 7/23.  Patient returned to home about 6 days prior to this admission.  She says she lives alone and usually ambulates with use of a cane.  Evaluation in the ED reviewed acute pulmonary embolism.  She was hospitalized for further management.  Consultants: Pulmonology.  Vascular surgery, Dr. Sheree  Procedures: Echocardiogram.  Lower extremity Doppler.    Subjective/Interval History: No complaints.   Assessment/Plan:  Acute versus subacute pulmonary embolism/acute DVT The only risk factor identified is sedentary lifestyle for the last several weeks due to her back issue.  Patient denies any history of cancer.  No recent travel. Patient is seen by pulmonology.  No plans for thrombolytics for her acute PE. Episodes of hypotension noted overnight which stabilized with IV fluids. Echocardiogram shows normal systolic function.  Right ventricular systolic function is noted to be normal. Lower extremity  Doppler study shows extensive DVT involving both extremities. Patient denies any significant pain in the legs but the legs are swollen. Vascular surgery does not recommend any interventions 8/16> Transition from Heparin  to Eliquis    Dry mouth - she is drinking well and per RN, voiding well - chlorhexadine mouth wash  Hypotension with h/o HTN Antihypertensives on hold Improved after fluid bolus  Resume Toprol  XL 25 min instead of 50 mg tomorrow AM Hold amlodipine  & furosemide  due to soft blood pressures.     Acute UTI She was given Diflucan  due to presence of yeast on the UA.  U culture show < 10,000 colonies  DC CTX  Normocytic anemia Drop in hemoglobin is noted.  No evidence of overt bleeding.    No deficiencies noted on anemia panel  History of coronary artery disease/history of stroke/hyperlipidemia Aspirin  on hold.  Continue statin.  Chronic back pain PT and OT recommending SNF   History of asthma Continue home medications.  Obesity Estimated body mass index is 33.37 kg/m as calculated from the following:   Height as of this encounter: 5' 1 (1.549 m).   Weight as of this encounter: 80.1 kg.   DVT Prophylaxis: Eliquis  Code Status: Full code Family Communication: Discussed with patient Disposition Plan: Medically stable for SNF     Medications: Scheduled:  apixaban   10 mg Oral BID   Followed by   NOREEN ON 12/29/2023] apixaban   5 mg Oral BID   benztropine   0.5 mg Oral BID   budesonide -glycopyrrolate -formoterol   2 puff Inhalation Daily   buPROPion   150 mg Oral Daily   Chlorhexidine  Gluconate Cloth  6 each Topical Daily   diclofenac  Sodium  2 g Topical QID  fluPHENAZine   5 mg Oral TID   metoprolol  succinate  25 mg Oral Daily   montelukast   10 mg Oral QHS   pantoprazole   40 mg Oral Daily   polyethylene glycol  17 g Oral BID   rosuvastatin   10 mg Oral Daily   senna-docusate  2 tablet Oral BID   sodium chloride  flush  10-40 mL Intracatheter Q12H    Continuous:   PRN:acetaminophen , albuterol , bisacodyl , ibuprofen , ondansetron  **OR** ondansetron  (ZOFRAN ) IV, mouth rinse, sodium chloride  flush, sodium phosphate  Antibiotics: Anti-infectives (From admission, onward)    Start     Dose/Rate Route Frequency Ordered Stop   12/20/23 1015  fluconazole  (DIFLUCAN ) tablet 200 mg        200 mg Oral  Once 12/20/23 0926 12/20/23 0944   12/20/23 1000  fluconazole  (DIFLUCAN ) tablet 200 mg  Status:  Discontinued        200 mg Oral Daily 12/20/23 0512 12/20/23 0926   12/20/23 1000  cefTRIAXone  (ROCEPHIN ) 1 g in sodium chloride  0.9 % 100 mL IVPB  Status:  Discontinued        1 g 200 mL/hr over 30 Minutes Intravenous Every 24 hours 12/20/23 0916 12/22/23 0817       Objective:  Vital Signs  Vitals:   12/23/23 0800 12/23/23 0900 12/23/23 0924 12/23/23 1050  BP: (!) 141/83 (!) 150/77 (!) 150/77 124/83  Pulse: (!) 106 (!) 107 77 94  Resp: (!) 25 (!) 23 (!) 24 20  Temp: (!) 97.2 F (36.2 C)   98.4 F (36.9 C)  TempSrc: Oral   Oral  SpO2: 95% 96% 95% 94%  Weight:      Height:        Intake/Output Summary (Last 24 hours) at 12/23/2023 1331 Last data filed at 12/23/2023 0932 Gross per 24 hour  Intake 180 ml  Output 0 ml  Net 180 ml   Filed Weights   12/19/23 2208 12/19/23 2339 12/23/23 0600  Weight: 86.9 kg 76.3 kg 80.1 kg    General appearance: Awake alert.  In no distress HEENT: severely dry mouth and lips Resp: Clear to auscultation bilaterally.  Normal effort Cardio: S1-S2 is normal regular.  No S3-S4.  No rubs murmurs or bruit GI: Abdomen is soft.  Moderately distended  Bowel sounds are present normal.  No masses organomegaly Extremities:  lower extremity swelling is noted bilaterally.  Physical deconditioning noted.  Decreased range of motion.  Lab Results:  Data Reviewed: I have personally reviewed following labs and reports of the imaging studies  CBC: Recent Labs  Lab 12/19/23 1906 12/20/23 0017 12/21/23 0541  12/22/23 0303 12/23/23 0242  WBC 9.0 8.9 6.9 7.1 7.1  HGB 13.0 13.0 10.4* 10.9* 10.7*  HCT 37.0 36.2 29.0* 31.8* 31.2*  MCV 86.4 86.8 86.8 87.4 87.2  PLT 353 342 246 313 349    Basic Metabolic Panel: Recent Labs  Lab 12/19/23 1906 12/20/23 0017 12/21/23 0541 12/22/23 0303  NA 133* 135 136 136  K 3.6 3.7 3.5 3.8  CL 98 97* 105 103  CO2 24 21* 21* 20*  GLUCOSE 120* 109* 103* 105*  BUN 16 15 9  7*  CREATININE 1.06* 0.97 0.84 0.75  CALCIUM  9.0 9.4 8.2* 9.1  MG  --   --  2.0  --     GFR: Estimated Creatinine Clearance: 57.3 mL/min (by C-G formula based on SCr of 0.75 mg/dL).  Liver Function Tests: Recent Labs  Lab 12/19/23 1906  AST 18  ALT 11  ALKPHOS  64  BILITOT 1.2  PROT 7.1  ALBUMIN 3.4*    Recent Labs  Lab 12/19/23 1906  LIPASE 30    Coagulation Profile: Recent Labs  Lab 12/20/23 0017  INR 1.2    Recent Results (from the past 240 hours)  MRSA Next Gen by PCR, Nasal     Status: None   Collection Time: 12/19/23 10:59 PM   Specimen: Nasal Mucosa; Nasal Swab  Result Value Ref Range Status   MRSA by PCR Next Gen NOT DETECTED NOT DETECTED Final    Comment: (NOTE) The GeneXpert MRSA Assay (FDA approved for NASAL specimens only), is one component of a comprehensive MRSA colonization surveillance program. It is not intended to diagnose MRSA infection nor to guide or monitor treatment for MRSA infections. Test performance is not FDA approved in patients less than 59 years old. Performed at [facility], [address], [place], [place] [address]   Urine Culture (for pregnant, neutropenic or urologic patients or patients with an indwelling urinary catheter)     Status: Abnormal   Collection Time: 12/20/23  3:30 AM   Specimen: Urine, Clean Catch  Result Value Ref Range Status   Specimen Description   Final    URINE, CLEAN CATCH Performed at [facility], [address], [place], [place] [address]    Special  Requests   Final    NONE Performed at [facility], [address], [place], [place] [address]    Culture (A)  Final    <10,000 COLONIES/mL INSIGNIFICANT GROWTH Performed at [facility], [address], [place], [place] [address]    Report Status 12/21/2023 FINAL  Final      Radiology Studies: DG Abd 1 View Result Date: 12/22/2023 CLINICAL DATA:  Epigastric pain, abdominal distention. EXAM: ABDOMEN - 1 VIEW COMPARISON:  None Available. FINDINGS: Nonobstructive bowel gas pattern. No organomegaly, visible free air or suspicious calcification. IMPRESSION: No acute findings. Electronically Signed   By: Franky Crease M.D.   On: 12/22/2023 19:20       LOS: 4 days   Kierstyn Baranowski  [facility] Pager on [email or web address]  12/23/2023, 1:31 PM

## 2023-12-24 DIAGNOSIS — I2609 Other pulmonary embolism with acute cor pulmonale: Secondary | ICD-10-CM | POA: Diagnosis not present

## 2023-12-24 LAB — CBC
HCT: 32.1 % — ABNORMAL LOW (ref 36.0–46.0)
Hemoglobin: 11.3 g/dL — ABNORMAL LOW (ref 12.0–15.0)
MCH: 31.2 pg (ref 26.0–34.0)
MCHC: 35.2 g/dL (ref 30.0–36.0)
MCV: 88.7 fL (ref 80.0–100.0)
Platelets: 360 K/uL (ref 150–400)
RBC: 3.62 MIL/uL — ABNORMAL LOW (ref 3.87–5.11)
RDW: 13.4 % (ref 11.5–15.5)
WBC: 8.8 K/uL (ref 4.0–10.5)
nRBC: 0 % (ref 0.0–0.2)

## 2023-12-24 NOTE — Plan of Care (Signed)
  Problem: Clinical Measurements: Goal: Ability to maintain clinical measurements within normal limits will improve Outcome: Progressing   Problem: Coping: Goal: Level of anxiety will decrease Outcome: Progressing   Problem: Elimination: Goal: Will not experience complications related to bowel motility Outcome: Progressing Goal: Will not experience complications related to urinary retention Outcome: Progressing   Problem: Pain Managment: Goal: General experience of comfort will improve and/or be controlled Outcome: Progressing

## 2023-12-24 NOTE — Progress Notes (Signed)
 [facility] PROGRESS NOTE   EISLEY BARBER FMW:[id number] DOB: July 17, 1945 DOA: 12/19/2023  PCP: Rosalea Rosina SAILOR, PA  Brief History: 78 y.o. female with medical history significant for CVA, nonobstructive CAD, HTN, HLD, asthma, GERD, RA who presented to the ED for evaluation of abdominal pain and shortness of breath. Patient states that she is having some burning sensation with urination. Patient recently admitted 6/24-6/28 for generalized weakness felt due to physical deconditioning/chronic debility.  Imaging was without evidence of acute abnormality in the spine, cord compression, or high-grade stenosis.  It did mention left foraminal to extraforaminal disc protrusion at L2-3 potentially affecting the exiting left L2 nerve root.  Patient was ultimately discharged to SNF per PT/OT recommendation. After discharge from SNF to home patient was again seen in ED on 7/21 with acute on chronic back pain interfering with mobility and ADLs.  She boarded in the ED and was discharged to SNF on 7/23.  Patient returned to home about 6 days prior to this admission.  She says she lives alone and usually ambulates with use of a cane.  Evaluation in the ED reviewed acute pulmonary embolism.  She was hospitalized for further management.  Consultants: Pulmonology.  Vascular surgery, Dr. Sheree  Procedures: Echocardiogram.  Lower extremity Doppler.    Subjective/Interval History:  States she finally had a BM today. RN tell me that she has been having BMs since yesterday after the Dulcolax. Per RN, she has also episodes of confusion today.   Assessment/Plan:  Acute versus subacute pulmonary embolism/acute DVT The only risk factor identified is sedentary lifestyle for the last several weeks due to her back issue.  Patient denies any history of cancer.  No recent travel. Patient is seen by pulmonology.  No plans for thrombolytics for her acute PE. Episodes of hypotension noted overnight which stabilized  with IV fluids. Echocardiogram shows normal systolic function.  Right ventricular systolic function is noted to be normal. Lower extremity Doppler study shows extensive DVT involving both extremities. Patient denies any significant pain in the legs but the legs are swollen. Vascular surgery does not recommend any interventions 8/16> Transition from Heparin  to Eliquis    Confusion - RN and I have both noted confusion today- dementia vs hospital acquired delirium- follow  Dry mouth - she is drinking well and per RN, voiding well - chlorhexadine mouth wash  Hypotension with h/o HTN Antihypertensives on hold Improved after fluid bolus  Resume Toprol  XL 25 min instead of 50 mg tomorrow AM Hold amlodipine  & furosemide  due to soft blood pressures.     Acute UTI She was given Diflucan  due to presence of yeast on the UA.  U culture show < 10,000 colonies  DC CTX  Normocytic anemia Drop in hemoglobin is noted.  No evidence of overt bleeding.    No deficiencies noted on anemia panel  History of coronary artery disease/history of stroke/hyperlipidemia Aspirin  on hold.  Continue statin.  Chronic back pain PT and OT recommending SNF  History of asthma Continue home medications.  Obesity Estimated body mass index is 33.37 kg/m as calculated from the following:   Height as of this encounter: 5' 1 (1.549 m).   Weight as of this encounter: 80.1 kg.   DVT Prophylaxis: Eliquis  Code Status: Full code Family Communication: Discussed with patient Disposition Plan: Medically stable for SNF     Medications: Scheduled:  apixaban   10 mg Oral BID   Followed by   NOREEN ON 12/29/2023] apixaban   5 mg Oral  BID   benztropine   0.5 mg Oral BID   budesonide -glycopyrrolate -formoterol   2 puff Inhalation Daily   buPROPion   150 mg Oral Daily   chlorhexidine   15 mL Mouth/Throat BID   Chlorhexidine  Gluconate Cloth  6 each Topical Daily   diclofenac  Sodium  2 g Topical QID   fluPHENAZine   5 mg  Oral TID   metoprolol  succinate  25 mg Oral Daily   montelukast   10 mg Oral QHS   pantoprazole   40 mg Oral Daily   polyethylene glycol  17 g Oral BID   rosuvastatin   10 mg Oral Daily   senna-docusate  2 tablet Oral BID   sodium chloride  flush  10-40 mL Intracatheter Q12H   Continuous:   PRN:acetaminophen , albuterol , bisacodyl , ibuprofen , ondansetron  **OR** ondansetron  (ZOFRAN ) IV, mouth rinse, sodium chloride  flush, sodium phosphate  Antibiotics: Anti-infectives (From admission, onward)    Start     Dose/Rate Route Frequency Ordered Stop   12/20/23 1015  fluconazole  (DIFLUCAN ) tablet 200 mg        200 mg Oral  Once 12/20/23 0926 12/20/23 0944   12/20/23 1000  fluconazole  (DIFLUCAN ) tablet 200 mg  Status:  Discontinued        200 mg Oral Daily 12/20/23 0512 12/20/23 0926   12/20/23 1000  cefTRIAXone  (ROCEPHIN ) 1 g in sodium chloride  0.9 % 100 mL IVPB  Status:  Discontinued        1 g 200 mL/hr over 30 Minutes Intravenous Every 24 hours 12/20/23 0916 12/22/23 0817       Objective:  Vital Signs  Vitals:   12/24/23 0445 12/24/23 0813 12/24/23 1023 12/24/23 1334  BP: (!) 151/87  (!) 132/91 125/83  Pulse: 87  96 90  Resp: 18   18  Temp: 98.4 F (36.9 C)   97.7 F (36.5 C)  TempSrc: Oral   Oral  SpO2: 96% 96%  96%  Weight:      Height:        Intake/Output Summary (Last 24 hours) at 12/24/2023 1952 Last data filed at 12/24/2023 1030 Gross per 24 hour  Intake 10 ml  Output --  Net 10 ml   Filed Weights   12/19/23 2208 12/19/23 2339 12/23/23 0600  Weight: 86.9 kg 76.3 kg 80.1 kg    General appearance: Awake alert.  In no distress HEENT: severely dry mouth and lips Resp: Clear to auscultation bilaterally.  Normal effort Cardio: S1-S2 is normal regular.  No S3-S4.  No rubs murmurs or bruit GI: Abdomen is soft.  Moderately distended  Bowel sounds are present normal.  No masses organomegaly Extremities:  lower extremity swelling is noted bilaterally.  Physical  deconditioning noted.  Decreased range of motion.  Lab Results:  Data Reviewed: I have personally reviewed following labs and reports of the imaging studies  CBC: Recent Labs  Lab 12/20/23 0017 12/21/23 0541 12/22/23 0303 12/23/23 0242 12/24/23 0522  WBC 8.9 6.9 7.1 7.1 8.8  HGB 13.0 10.4* 10.9* 10.7* 11.3*  HCT 36.2 29.0* 31.8* 31.2* 32.1*  MCV 86.8 86.8 87.4 87.2 88.7  PLT 342 246 313 349 360    Basic Metabolic Panel: Recent Labs  Lab 12/19/23 1906 12/20/23 0017 12/21/23 0541 12/22/23 0303  NA 133* 135 136 136  K 3.6 3.7 3.5 3.8  CL 98 97* 105 103  CO2 24 21* 21* 20*  GLUCOSE 120* 109* 103* 105*  BUN 16 15 9  7*  CREATININE 1.06* 0.97 0.84 0.75  CALCIUM  9.0 9.4 8.2* 9.1  MG  --   --  2.0  --     GFR: Estimated Creatinine Clearance: 57.3 mL/min (by C-G formula based on SCr of 0.75 mg/dL).  Liver Function Tests: Recent Labs  Lab 12/19/23 1906  AST 18  ALT 11  ALKPHOS 64  BILITOT 1.2  PROT 7.1  ALBUMIN 3.4*    Recent Labs  Lab 12/19/23 1906  LIPASE 30    Coagulation Profile: Recent Labs  Lab 12/20/23 0017  INR 1.2    Recent Results (from the past 240 hours)  MRSA Next Gen by PCR, Nasal     Status: None   Collection Time: 12/19/23 10:59 PM   Specimen: Nasal Mucosa; Nasal Swab  Result Value Ref Range Status   MRSA by PCR Next Gen NOT DETECTED NOT DETECTED Final    Comment: (NOTE) The GeneXpert MRSA Assay (FDA approved for NASAL specimens only), is one component of a comprehensive MRSA colonization surveillance program. It is not intended to diagnose MRSA infection nor to guide or monitor treatment for MRSA infections. Test performance is not FDA approved in patients less than 42 years old. Performed at [facility], [address] [place], [place] [address]   Urine Culture (for pregnant, neutropenic or urologic patients or patients with an indwelling urinary catheter)     Status: Abnormal   Collection Time: 12/20/23   3:30 AM   Specimen: Urine, Clean Catch  Result Value Ref Range Status   Specimen Description   Final    URINE, CLEAN CATCH Performed at [facility], [address], [place], [place] [address]    Special Requests   Final    NONE Performed at [facility], [address] [place], [place] [address]    Culture (A)  Final    <10,000 COLONIES/mL INSIGNIFICANT GROWTH Performed at [facility], [address] [place], [place] [address]    Report Status 12/21/2023 FINAL  Final      Radiology Studies: No results found.      LOS: 5 days   Kamron Vanwyhe  [facility] Pager on [email or web address]  12/24/2023, 7:52 PM

## 2023-12-24 NOTE — Progress Notes (Signed)
 Occupational Therapy Treatment Patient Details Name: [name] MRN: [id number] DOB: Oct 06, 1945 Today's Date: 12/24/2023   History of present illness [name] is a 78 y.o. female presents to therapy following hospital admission on 12/19/2023 due to pain all over including angina, SOB, productive cough, burning with urination and B LE edema. Pt found to have PE and started on heparin  drip, R heart strain and L femoral DVT, small hiatal hernia and inflammatory process of bladder indicative of infection. Pt had recent hospitalization 7/21-7/23 secondary to LBP and 6/24-6/28 both hospitalizations pt d/c to SNF.  Pt EFY:pwrolizd but is not limited to:  HTN, CVA with L sided deficits UE (finger contractures) and LE (hip and knee flexion contractures), diabetes, heart attack, GERD, RA chronic LBP.   OT comments  Pt presents with fluctuating cognition, is easily externally distracted and requires constant cues for sequencing and task attention. Pt received upright in recliner with PT and RN in room. Requires heavy +2 MAX A for STS and step pivot transfers from recliner > BSC > recliner. TOTAL A for pericare in standing, pt with poor standing tolerance and requires MOD A for static standing balance. Pt with chronic LUE hemiplegia and cannot use LUE during functional task performance. HR elevated to 144 bpm after transfer to [facility], decreases down to 110 bpm with rest and pursed lip breathing. Pt is far from functional baseline and is unsafe to discharge home alone. Pt would benefit from skilled OT services to address noted impairments and functional limitations (see below for any additional details) in order to maximize safety and independence while minimizing falls risk and caregiver burden. Anticipate the need for follow up OT services upon acute hospital DC. Patient will benefit from continued inpatient follow up therapy, <3 hours/day       If plan is discharge home, recommend the following:   Assistance with cooking/housework;Direct supervision/assist for financial management;Direct supervision/assist for medications management;Assist for transportation;Two people to help with walking and/or transfers;Two people to help with bathing/dressing/bathroom   Equipment Recommendations  Other (comment) (defer to next LOC)       Precautions / Restrictions Precautions Precautions: Fall Recall of Precautions/Restrictions: Impaired Restrictions Weight Bearing Restrictions Per Provider Order: No       Mobility Bed Mobility Overal bed mobility: Needs Assistance             General bed mobility comments: NT, pt recieved sitting in recliner with RN and PT present    Transfers Overall transfer level: Needs assistance Equipment used: Hemi-walker Transfers: Sit to/from Stand, Bed to chair/wheelchair/BSC Sit to Stand: Max assist, +2 physical assistance     Step pivot transfers: Max assist, +2 physical assistance     General transfer comment: mod to max A+2 for STS transfers, max A+2 for step pivot bed<>recliner and recliner<>BSC, decreased initiation, cues for sequencing and attention to task, pt appears distracted while looking around room during transfer     Balance Overall balance assessment: Needs assistance Sitting-balance support: Feet supported, Single extremity supported Sitting balance-Leahy Scale: Fair     Standing balance support: During functional activity, Single extremity supported Standing balance-Leahy Scale: Poor Standing balance comment: requires external support                           ADL either performed or assessed with clinical judgement   ADL Overall ADL's : Needs assistance/impaired  Toilet Transfer: +2 for physical assistance;Maximal assistance;BSC/3in1 Statistician Details (indicate cue type and reason): SPT from recliner > BSC > recliner. +2 MAX HHA with knee block. Pt with difficulties motor  planning and with GMC due to physical / cognitive deficits. HR up to 144 bpm  after transfer. Toileting- Clothing Manipulation and Hygiene: Total assistance;Sit to/from stand Toileting - Clothing Manipulation Details (indicate cue type and reason): TOTAL A for clothing mgmt and hygeine BSC level. pt requires gown change.     Functional mobility during ADLs: Maximal assistance;+2 for physical assistance;Cueing for sequencing;Cueing for safety General ADL Comments: Pt recieved seated in recliner with PT, noted to be inco     Communication Communication Communication: No apparent difficulties   Cognition Arousal: Alert Behavior During Therapy: Flat affect               OT - Cognition Comments: flucuating cognition. intermittently confused, not oriented to time.                 Following commands: Impaired Following commands impaired: Follows one step commands inconsistently      Cueing   Cueing Techniques: Verbal cues, Tactile cues, Gestural cues        General Comments HR up to 144 with BSC transfer and toileting, back down to 110s with seated rest, SpO2 96% on RA - RN notified    Pertinent Vitals/ Pain       Pain Assessment Pain Assessment: 0-10 Pain Score: 7  Pain Location: chest, head, arthritis Pain Descriptors / Indicators: Discomfort Pain Intervention(s): Limited activity within patient's tolerance, Monitored during session   Frequency  Min 2X/week        Progress Toward Goals  OT Goals(current goals can now be found in the care plan section)  Progress towards OT goals: Progressing toward goals  Acute Rehab OT Goals OT Goal Formulation: With patient Time For Goal Achievement: 01/04/24 Potential to Achieve Goals: Fair ADL Goals Pt Will Perform Upper Body Dressing: with min assist;sitting Pt Will Perform Lower Body Dressing: with min assist;sitting/lateral leans;with adaptive equipment Pt Will Transfer to Toilet: with min assist;stand pivot  transfer;bedside commode Additional ADL Goal #1: Pt will perform bed mobility with CGA, in prep for progressive ADL participation.  Plan      Co-evaluation    PT/OT/SLP Co-Evaluation/Treatment: Yes Reason for Co-Treatment: For patient/therapist safety;To address functional/ADL transfers PT goals addressed during session: Mobility/safety with mobility;Balance;Proper use of DME OT goals addressed during session: ADL's and self-care;Proper use of Adaptive equipment and DME      AM-PAC OT 6 Clicks Daily Activity     Outcome Measure   Help from another person eating meals?: A Little Help from another person taking care of personal grooming?: A Little Help from another person toileting, which includes using toliet, bedpan, or urinal?: A Lot Help from another person bathing (including washing, rinsing, drying)?: A Lot Help from another person to put on and taking off regular upper body clothing?: A Lot Help from another person to put on and taking off regular lower body clothing?: A Lot 6 Click Score: 14    End of Session Equipment Utilized During Treatment: Gait belt;Other (comment) (hemiwalker)  OT Visit Diagnosis: Unsteadiness on feet (R26.81);Other abnormalities of gait and mobility (R26.89);Muscle weakness (generalized) (M62.81);Other symptoms and signs involving cognitive function;Hemiplegia and hemiparesis Hemiplegia - Right/Left: Left Hemiplegia - dominant/non-dominant: Non-Dominant   Activity Tolerance Patient tolerated treatment well   Patient Left in chair;with call bell/phone within reach;with chair alarm set  Nurse Communication Mobility status        Time: 8970-8947 OT Time Calculation (min): 23 min  Charges: OT General Charges $OT Visit: 1 Visit OT Treatments $Self Care/Home Management : 8-22 mins  Darcella Shiffman L. Britaney Espaillat, OTR/L  12/24/23, 3:11 PM

## 2023-12-24 NOTE — Progress Notes (Signed)
 Physical Therapy Treatment Patient Details Name: [name] MRN: [id number] DOB: Nov 11, 1945 Today's Date: 12/24/2023   History of Present Illness [name] is a 78 y.o. female presents to therapy following hospital admission on 12/19/2023 due to pain all over including angina, SOB, productive cough, burning with urination and B LE edema. Pt found to have PE and started on heparin  drip, R heart strain and L femoral DVT, small hiatal hernia and inflammatory process of bladder indicative of infection. Pt had recent hospitalization 7/21-7/23 secondary to LBP and 6/24-6/28 both hospitalizations pt d/c to SNF.  Pt EFY:pwrolizd but is not limited to:  HTN, CVA with L sided deficits UE (finger contractures) and LE (hip and knee flexion contractures), diabetes, heart attack, GERD, RA chronic LBP.    PT Comments  Pt with varying cognition, does answer name, DOB and location appropriately, but then asks where she is, states she is leaving at 1, has decreased initiation and needing cues for sequencing and attention to task. Pt needing +2 assistance with bed mobility and transfers, heavy multimodal cues for sequencing, initiation and attention to task throughout. Pt needing initial assistance for sitting EOB, improves with time and RUE propping. Pt with difficulty clearing each foot with step pivot transfers when going to R and L, intermittent foot placement assistance, and unable to take sequential steps forward. Therapist assisting pt in static, supported standing with mod A while OT assisting with pericare. Pt's HR up to 144 max with transfer to [facility] and toileting, cues for relaxation and pursed lip breathing. Pt on RA with Spo2 96%, denies SOB and doesn't appear to be SOB.   If plan is discharge home, recommend the following: Assistance with cooking/housework;Assist for transportation;Two people to help with walking and/or transfers;A lot of help with bathing/dressing/bathroom;Supervision due to  cognitive status;Direct supervision/assist for financial management;Direct supervision/assist for medications management   Can travel by private vehicle     No  Equipment Recommendations  None recommended by PT    Recommendations for Other Services       Precautions / Restrictions Precautions Precautions: Fall Restrictions Weight Bearing Restrictions Per Provider Order: No     Mobility  Bed Mobility Overal bed mobility: Needs Assistance Bed Mobility: Supine to Sit     Supine to sit: Max assist, +2 for physical assistance     General bed mobility comments: max A+2 for transitioning to sitting EOB for trunk assistance and BLE assistance, cues for initiation and sequencing with little carryover    Transfers Overall transfer level: Needs assistance Equipment used: Hemi-walker Transfers: Sit to/from Stand, Bed to chair/wheelchair/BSC Sit to Stand: Mod assist, Max assist, +2 physical assistance   Step pivot transfers: Max assist, +2 physical assistance       General transfer comment: mod to max A+2 for STS transfers, max A+2 for step pivot bed<>recliner and recliner<>BSC, decreased initiation, cues for sequencing and attention to task, pt appears distracted while looking around room during transfer    Ambulation/Gait               General Gait Details: pt needing max A+2 for weightshifting to slide each foot forward with step pivot to recliner, unable to take sequential steps at bedside, difficulty advancing each foot needing assistance intermittently   Stairs             Wheelchair Mobility     Tilt Bed    Modified Rankin (Stroke Patients Only)       Balance   Sitting-balance  support: Feet supported, Single extremity supported Sitting balance-Leahy Scale: Fair     Standing balance support: During functional activity, Single extremity supported Standing balance-Leahy Scale: Zero                              Communication  Communication Communication: No apparent difficulties  Cognition Arousal: Alert Behavior During Therapy: Flat affect   PT - Cognitive impairments: No family/caregiver present to determine baseline                       PT - Cognition Comments: pt slow to respond to questions, able to answer a&o questions for self, DOB and location but unable to answer date; appears to be confused intermittently stating I'm leaving at 1 and asking where she is, decreased initiation, cues for attention to task and sequencing Following commands: Impaired Following commands impaired: Follows one step commands inconsistently    Cueing Cueing Techniques: Verbal cues, Tactile cues, Gestural cues  Exercises      General Comments General comments (skin integrity, edema, etc.): HR up to 144 with BSC transfer and toileting, back down to 110s with seated rest, SpO2 96% on RA - RN notified      Pertinent Vitals/Pain Pain Assessment Pain Assessment: 0-10 Pain Score: 7  Pain Location: chest, head, arthritis Pain Descriptors / Indicators: Discomfort Pain Intervention(s): Limited activity within patient's tolerance, Monitored during session, Repositioned, RN gave pain meds during session    Home Living                          Prior Function            PT Goals (current goals can now be found in the care plan section) Acute Rehab PT Goals Patient Stated Goal: to be safe at home PT Goal Formulation: With patient Time For Goal Achievement: 12/25/23 Potential to Achieve Goals: Good Progress towards PT goals: Progressing toward goals    Frequency    Min 2X/week      PT Plan      Co-evaluation PT/OT/SLP Co-Evaluation/Treatment: Yes Reason for Co-Treatment: For patient/therapist safety;To address functional/ADL transfers PT goals addressed during session: Mobility/safety with mobility;Balance;Proper use of DME        AM-PAC PT 6 Clicks Mobility   Outcome Measure  Help  needed turning from your back to your side while in a flat bed without using bedrails?: A Lot Help needed moving from lying on your back to sitting on the side of a flat bed without using bedrails?: A Lot Help needed moving to and from a bed to a chair (including a wheelchair)?: Total Help needed standing up from a chair using your arms (e.g., wheelchair or bedside chair)?: Total Help needed to walk in hospital room?: Total Help needed climbing 3-5 steps with a railing? : Total 6 Click Score: 8    End of Session Equipment Utilized During Treatment: Gait belt Activity Tolerance: Patient tolerated treatment well Patient left: in chair;with call bell/phone within reach;with chair alarm set Nurse Communication: Mobility status;Patient requests pain meds PT Visit Diagnosis: Unsteadiness on feet (R26.81);Muscle weakness (generalized) (M62.81);Pain;Other abnormalities of gait and mobility (R26.89);Difficulty in walking, not elsewhere classified (R26.2) Pain - Right/Left:  (LBP) Pain - part of body:  (mid thoracic back)     Time: 8991-8948 PT Time Calculation (min) (ACUTE ONLY): 43 min  Charges:    $Therapeutic Activity: 23-37  mins PT General Charges $$ ACUTE PT VISIT: 1 Visit                     Tori Justise Ehmann PT, DPT 12/24/23, 11:16 AM

## 2023-12-25 DIAGNOSIS — I2609 Other pulmonary embolism with acute cor pulmonale: Secondary | ICD-10-CM | POA: Diagnosis not present

## 2023-12-25 LAB — CBC
HCT: 33.7 % — ABNORMAL LOW (ref 36.0–46.0)
Hemoglobin: 11.5 g/dL — ABNORMAL LOW (ref 12.0–15.0)
MCH: 29.6 pg (ref 26.0–34.0)
MCHC: 34.1 g/dL (ref 30.0–36.0)
MCV: 86.6 fL (ref 80.0–100.0)
Platelets: 382 K/uL (ref 150–400)
RBC: 3.89 MIL/uL (ref 3.87–5.11)
RDW: 13.4 % (ref 11.5–15.5)
WBC: 8.2 K/uL (ref 4.0–10.5)
nRBC: 0 % (ref 0.0–0.2)

## 2023-12-25 NOTE — Plan of Care (Signed)
  Problem: Education: Goal: Knowledge of General Education information will improve Description: Including pain rating scale, medication(s)/side effects and non-pharmacologic comfort measures Outcome: Progressing   Problem: Health Behavior/Discharge Planning: Goal: Ability to manage health-related needs will improve Outcome: Progressing   Problem: Clinical Measurements: Goal: Ability to maintain clinical measurements within normal limits will improve Outcome: Progressing Goal: Will remain free from infection Outcome: Progressing Goal: Diagnostic test results will improve Outcome: Progressing Goal: Respiratory complications will improve Outcome: Progressing Goal: Cardiovascular complication will be avoided Outcome: Progressing   Problem: Activity: Goal: Risk for activity intolerance will decrease Outcome: Progressing   Problem: Nutrition: Goal: Adequate nutrition will be maintained Outcome: Progressing   Problem: Coping: Goal: Level of anxiety will decrease Outcome: Progressing   Problem: Skin Integrity: Goal: Risk for impaired skin integrity will decrease Outcome: Progressing   Problem: Safety: Goal: Ability to remain free from injury will improve Outcome: Progressing

## 2023-12-25 NOTE — Plan of Care (Signed)
°  Problem: Clinical Measurements: Goal: Will remain free from infection Outcome: Progressing   Problem: Nutrition: Goal: Adequate nutrition will be maintained Outcome: Progressing   Problem: Coping: Goal: Level of anxiety will decrease Outcome: Progressing

## 2023-12-25 NOTE — Plan of Care (Signed)
   Problem: Nutrition: Goal: Adequate nutrition will be maintained Outcome: Progressing   Problem: Elimination: Goal: Will not experience complications related to bowel motility Outcome: Progressing

## 2023-12-25 NOTE — Progress Notes (Signed)
 [facility] PROGRESS NOTE   [name] FMW:[id number] DOB: 1946/02/14 DOA: 12/19/2023  PCP: Rosalea Rosina SAILOR, PA  Brief History: 78 y.o. female with medical history significant for CVA, nonobstructive CAD, HTN, HLD, asthma, GERD, RA who presented to the ED for evaluation of abdominal pain and shortness of breath. Patient states that she is having some burning sensation with urination. Patient recently admitted 6/24-6/28 for generalized weakness felt due to physical deconditioning/chronic debility.  Imaging was without evidence of acute abnormality in the spine, cord compression, or high-grade stenosis.  It did mention left foraminal to extraforaminal disc protrusion at L2-3 potentially affecting the exiting left L2 nerve root.  Patient was ultimately discharged to SNF per PT/OT recommendation. After discharge from SNF to home patient was again seen in ED on 7/21 with acute on chronic back pain interfering with mobility and ADLs.  She boarded in the ED and was discharged to SNF on 7/23.  Patient returned to home about 6 days prior to this admission.  She says she lives alone and usually ambulates with use of a cane.  Evaluation in the ED reviewed acute pulmonary embolism.  She was hospitalized for further management.  Consultants: Pulmonology.  Vascular surgery, Dr. Sheree  Procedures: Echocardiogram.  Lower extremity Doppler.    Subjective/Interval History: No concerns today   Assessment/Plan:  Acute versus subacute pulmonary embolism/acute DVT The only risk factor identified is sedentary lifestyle for the last several weeks due to her back issue.  Patient denies any history of cancer.  No recent travel. Patient was seen by pulmonology.  No plans for thrombolytics for her acute PE. Echocardiogram shows normal systolic function.  Right ventricular systolic function is noted to be normal. Lower extremity Doppler study showed extensive DVT involving both extremities. Patient  denies any significant pain in the legs but the legs are swollen. 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     Candidiasis r/o UTI She was given Diflucan  due to presence of yeast on the UA.  U culture show < 10,000 colonies  DC CTX  Normocytic anemia No deficiencies noted on anemia panel Periodically monitor CBC  History of coronary artery disease/history of stroke/hyperlipidemia Aspirin  on hold.   Continue statin.  Chronic back pain PT and OT recommending SNF  History of asthma Continue home medications.  Obesity Estimated body mass index is 33.37 kg/m as calculated from the following:   Height as of this encounter: 5' 1 (1.549 m).   Weight as of this encounter: 80.1 kg.   DVT Prophylaxis: Eliquis  Code Status: Full code Family Communication: none at this tiem Disposition Plan: Medically stable for SNF     Medications: Scheduled:  apixaban   10 mg Oral BID   Followed by   NOREEN ON 12/29/2023] apixaban   5 mg Oral BID   benztropine   0.5 mg Oral BID   budesonide -glycopyrrolate -formoterol   2 puff Inhalation Daily   buPROPion   150 mg Oral Daily   chlorhexidine   15 mL Mouth/Throat BID   Chlorhexidine  Gluconate Cloth  6 each Topical Daily   diclofenac  Sodium  2 g Topical QID   fluPHENAZine   5 mg Oral TID   metoprolol  succinate  25  mg Oral Daily   montelukast   10 mg Oral QHS   pantoprazole   40 mg Oral Daily   polyethylene glycol  17 g Oral BID   rosuvastatin   10 mg Oral Daily   senna-docusate  2 tablet Oral BID   sodium chloride  flush  10-40 mL Intracatheter Q12H   Continuous:   PRN:acetaminophen , albuterol , bisacodyl ,  ibuprofen , ondansetron  **OR** ondansetron  (ZOFRAN ) IV, mouth rinse, sodium chloride  flush, sodium phosphate  Antibiotics: Anti-infectives (From admission, onward)    Start     Dose/Rate Route Frequency Ordered Stop   12/20/23 1015  fluconazole  (DIFLUCAN ) tablet 200 mg        200 mg Oral  Once 12/20/23 0926 12/20/23 0944   12/20/23 1000  fluconazole  (DIFLUCAN ) tablet 200 mg  Status:  Discontinued        200 mg Oral Daily 12/20/23 0512 12/20/23 0926   12/20/23 1000  cefTRIAXone  (ROCEPHIN ) 1 g in sodium chloride  0.9 % 100 mL IVPB  Status:  Discontinued        1 g 200 mL/hr over 30 Minutes Intravenous Every 24 hours 12/20/23 0916 12/22/23 0817       Objective:  Vital Signs  Vitals:   12/24/23 2043 12/25/23 0813 12/25/23 0848 12/25/23 1538  BP: 114/67  122/68 118/70  Pulse: 74  85 84  Resp: 19   17  Temp: 98.3 F (36.8 C)   97.6 F (36.4 C)  TempSrc: Oral   Oral  SpO2: 98% 94%  99%  Weight:      Height:        Intake/Output Summary (Last 24 hours) at 12/25/2023 1645 Last data filed at 12/25/2023 1300 Gross per 24 hour  Intake 240 ml  Output --  Net 240 ml   Filed Weights   12/19/23 2208 12/19/23 2339 12/23/23 0600  Weight: 86.9 kg 76.3 kg 80.1 kg    General appearance: Awake alert.  In no distress HEENT: severely dry mouth and lips Resp: Clear to auscultation bilaterally.  Normal effort Cardio: S1-S2 is normal regular.  No S3-S4.  No rubs murmurs or bruit GI: Abdomen is soft.  Moderately distended  Bowel sounds are present normal.  No masses organomegaly Extremities:  lower extremity swelling is noted bilaterally.  Physical deconditioning noted.  Decreased range of motion.  Lab Results:  Data Reviewed: I have personally reviewed following labs and reports of the imaging studies  CBC: Recent Labs  Lab 12/21/23 0541 12/22/23 0303 12/23/23 0242 12/24/23 0522 12/25/23 1341  WBC 6.9 7.1 7.1 8.8 8.2  HGB 10.4* 10.9* 10.7* 11.3* 11.5*  HCT 29.0* 31.8* 31.2* 32.1*  33.7*  MCV 86.8 87.4 87.2 88.7 86.6  PLT 246 313 349 360 382    Basic Metabolic Panel: Recent Labs  Lab 12/19/23 1906 12/20/23 0017 12/21/23 0541 12/22/23 0303  NA 133* 135 136 136  K 3.6 3.7 3.5 3.8  CL 98 97* 105 103  CO2 24 21* 21* 20*  GLUCOSE 120* 109* 103* 105*  BUN 16 15 9  7*  CREATININE 1.06* 0.97 0.84 0.75  CALCIUM  9.0 9.4 8.2* 9.1  MG  --   --  2.0  --     GFR: Estimated Creatinine Clearance: 57.3 mL/min (by C-G formula based on SCr of 0.75 mg/dL).  Liver Function Tests: Recent Labs  Lab 12/19/23 1906  AST 18  ALT 11  ALKPHOS 64  BILITOT 1.2  PROT 7.1  ALBUMIN 3.4*    Recent Labs  Lab 12/19/23 1906  LIPASE 30  Coagulation Profile: Recent Labs  Lab 12/20/23 0017  INR 1.2    Recent Results (from the past 240 hours)  MRSA Next Gen by PCR, Nasal     Status: None   Collection Time: 12/19/23 10:59 PM   Specimen: Nasal Mucosa; Nasal Swab  Result Value Ref Range Status   MRSA by PCR Next Gen NOT DETECTED NOT DETECTED Final    Comment: (NOTE) The GeneXpert MRSA Assay (FDA approved for NASAL specimens only), is one component of a comprehensive MRSA colonization surveillance program. It is not intended to diagnose MRSA infection nor to guide or monitor treatment for MRSA infections. Test performance is not FDA approved in patients less than 1 years old. Performed at [facility], [address], [place], [place] [address]   Urine Culture (for pregnant, neutropenic or urologic patients or patients with an indwelling urinary catheter)     Status: Abnormal   Collection Time: 12/20/23  3:30 AM   Specimen: Urine, Clean Catch  Result Value Ref Range Status   Specimen Description   Final    URINE, CLEAN CATCH Performed at [facility], [address], [place], [place] [address]    Special Requests   Final    NONE Performed at [facility], [address] [place], [place] [address]     Culture (A)  Final    <10,000 COLONIES/mL INSIGNIFICANT GROWTH Performed at [facility], [address], [place], [place] [address]    Report Status 12/21/2023 FINAL  Final      Radiology Studies: No results found.      LOS: 6 days   Michaelah Credeur  [facility] Pager on [email or web address]  12/25/2023, 4:45 PM

## 2023-12-25 NOTE — TOC Progression Note (Addendum)
 Transition of Care [facility]) - Progression Note    Patient Details  Name: [name] MRN: [id number] Date of Birth: 12/22/1945  Transition of Care [facility]) CM/SW Contact  [name], RN Phone Number:[phone]  12/25/2023, 11:35 AM  Clinical Narrative:    CM spoke with Glennette APS SW at DSS. Glennette states that she just got case for patient. Glennette states that she spoke with patient who has confirmed that she is aware that she is unable to care for herself properly and agreeable that she needs long term care. APS is following but does not have a court order or guardianship. SW states she picked up the case and spoke with the patient and since the patient was in agreement that she needs assistance. Glennette has requested that CM email [facility]2 for placement assistance.   1336 CM at bedside . Patient is awake alert and oriented and responds appropriately. Patient was unable to recall CM visiting on yesterday. CM explained that patient was just waking up and did not respond to questions. Patient is apologetic and responding appropriately now. CM presented patient with list of facilities that have made bed offers. Patients first preference is [facility].   CM reached out to Kia with [facility]. Per Kia the facility can not offer a LTC bed.    Expected Discharge Plan: Skilled Nursing Facility Barriers to Discharge: Continued Medical Work up               Expected Discharge Plan and Services In-house Referral: NA Discharge Planning Services: CM Consult Post Acute Care Choice: Skilled Nursing Facility Living arrangements for the past 2 months: Apartment                 DME Arranged: N/A DME Agency: NA       HH Arranged: NA HH Agency: Programmer, multimedia spoke with at [facility] Agency: Brandi with [employer]   Social Drivers of Health (SDOH) Interventions SDOH Screenings   Food Insecurity: No Food Insecurity (12/19/2023)  Housing: Low Risk  (12/19/2023)   Transportation Needs: No Transportation Needs (12/19/2023)  Utilities: Not At Risk (12/19/2023)  Social Connections: Socially Isolated (12/19/2023)  Tobacco Use: Medium Risk (12/19/2023)    Readmission Risk Interventions    12/21/2023    3:40 PM  Readmission Risk Prevention Plan  Transportation Screening Complete  PCP or Specialist Appt within 3-5 Days Complete  HRI or Home Care Consult Complete  Social Work Consult for Recovery Care Planning/Counseling Complete  Palliative Care Screening Not Applicable  Medication Review Oceanographer) Complete

## 2023-12-26 DIAGNOSIS — R1084 Generalized abdominal pain: Secondary | ICD-10-CM

## 2023-12-26 DIAGNOSIS — I2609 Other pulmonary embolism with acute cor pulmonale: Secondary | ICD-10-CM | POA: Diagnosis not present

## 2023-12-26 NOTE — Plan of Care (Signed)

## 2023-12-26 NOTE — Progress Notes (Signed)
 Progress Note   Patient: [name] FMW:[id number] DOB: 25-Jul-1945 DOA: 12/19/2023     7 DOS: the patient was seen and examined on 12/26/2023   Brief hospital course: 78 y.o. female with medical history significant for CVA, nonobstructive CAD, HTN, HLD, asthma, GERD, RA who presented to the ED for evaluation of abdominal pain and shortness of breath. Patient states that she is having some burning sensation with urination. Patient recently admitted 6/24-6/28 for generalized weakness felt due to physical deconditioning/chronic debility.  Imaging was without evidence of acute abnormality in the spine, cord compression, or high-grade stenosis.  It did mention left foraminal to extraforaminal disc protrusion at L2-3 potentially affecting the exiting left L2 nerve root.  Patient was ultimately discharged to SNF per PT/OT recommendation. After discharge from SNF to home patient was again seen in ED on 7/21 with acute on chronic back pain interfering with mobility and ADLs.  She boarded in the ED and was discharged to SNF on 7/23.  Patient returned to home about 6 days prior to this admission.  She says she lives alone and usually ambulates with use of a cane.  Evaluation in the ED reviewed acute pulmonary embolism.  She was hospitalized for further management.   Assessment and Plan: Acute versus subacute pulmonary embolism/acute DVT The only risk factor identified is sedentary lifestyle for the last several weeks due to her back issue.  Patient denies any history of cancer.  No recent travel. Patient was seen by pulmonology.  No plans for thrombolytics for her acute PE. Echocardiogram shows normal systolic function.  Right ventricular systolic function is noted to be normal. Lower extremity Doppler study showed extensive DVT involving both extremities. Patient denies any significant pain in the legs but the legs are swollen. 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      Candidiasis r/o UTI She was given Diflucan  due to presence of yeast on the UA.  U culture show < 10,000 colonies  DC CTX   Normocytic anemia No deficiencies noted on anemia panel Periodically monitor CBC   History of coronary artery disease/history of stroke/hyperlipidemia Aspirin  on hold.   Continue statin.   Chronic back pain PT and OT recommending SNF   History of asthma Continue home medications.   Obesity Estimated body mass index is 33.37 kg/m as calculated from the following:   Height as of this encounter: 5' 1 (1.549 m).   Weight as of this encounter: 80.1 kg.      Subjective: Without complaints  Physical Exam: Vitals:   12/26/23 0850 12/26/23 0851 12/26/23 0942 12/26/23 1318  BP:   130/64 (!) 159/73  Pulse:   92 83  Resp:    (!) 22  Temp:    98.4 F (36.9 C)  TempSrc:    Oral  SpO2: 97% 97%  98%  Weight:      Height:       General exam: Awake, laying in bed, in nad Respiratory system: Normal respiratory effort, no wheezing Cardiovascular system: regular rate, s1, s2 Gastrointestinal system: Soft, nondistended, positive BS Central nervous system: CN2-12 grossly intact, strength intact Extremities: Perfused, no clubbing Skin: Normal skin turgor, no notable skin lesions seen Psychiatry: Mood normal //  no visual hallucinations   Data Reviewed:  There are no new results to review at this time.  Family Communication: Pt  in room, family not at bedside  Disposition: Status is: Inpatient Remains inpatient appropriate because: severity of illness  Planned Discharge Destination: Skilled nursing facility    Author: Garnette Pelt, MD 12/26/2023 5:45 PM  For on  call review [email or web address].

## 2023-12-26 NOTE — TOC Progression Note (Signed)
 Transition of Care [facility]) - Progression Note    Patient Details  Name: [name] MRN: [id number] Date of Birth: 1945/09/15  Transition of Care [facility]) CM/SW Contact  [name], RN Phone Number: 12/26/2023, 11:28 AM  Clinical Narrative:    CM has requested that [facility], [facility], [facility] and [facility] review patient info to determine is they are able to offer long term care bed. Will continue to follow.    Expected Discharge Plan: Skilled Nursing Facility Barriers to Discharge: Continued Medical Work up               Expected Discharge Plan and Services In-house Referral: NA Discharge Planning Services: CM Consult Post Acute Care Choice: Skilled Nursing Facility Living arrangements for the past 2 months: Apartment                 DME Arranged: N/A DME Agency: NA       HH Arranged: NA HH Agency: Programmer, multimedia spoke with at [facility] Agency: Brandi with [employer]   Social Drivers of Health (SDOH) Interventions SDOH Screenings   Food Insecurity: No Food Insecurity (12/19/2023)  Housing: Low Risk  (12/19/2023)  Transportation Needs: No Transportation Needs (12/19/2023)  Utilities: Not At Risk (12/19/2023)  Social Connections: Socially Isolated (12/19/2023)  Tobacco Use: Medium Risk (12/19/2023)    Readmission Risk Interventions    12/21/2023    3:40 PM  Readmission Risk Prevention Plan  Transportation Screening Complete  PCP or Specialist Appt within 3-5 Days Complete  HRI or Home Care Consult Complete  Social Work Consult for Recovery Care Planning/Counseling Complete  Palliative Care Screening Not Applicable  Medication Review Oceanographer) Complete

## 2023-12-26 NOTE — TOC Progression Note (Signed)
 Transition of Care [facility]) - Progression Note    Patient Details  Name: [name] MRN: [id number] Date of Birth: 1946/02/22  Transition of Care [facility]) CM/SW Contact  [name], RN Phone Number:[phone]  12/26/2023, 11:08 AM  Clinical Narrative:    JANAS has been faxed to DSS  [email or web address] per  Glennette PIES request. IP care manager will continue to follow.    Expected Discharge Plan: Skilled Nursing Facility Barriers to Discharge: Continued Medical Work up               Expected Discharge Plan and Services In-house Referral: NA Discharge Planning Services: CM Consult Post Acute Care Choice: Skilled Nursing Facility Living arrangements for the past 2 months: Apartment                 DME Arranged: N/A DME Agency: NA       HH Arranged: NA HH Agency: Programmer, multimedia spoke with at [facility] Agency: Brandi with [employer]   Social Drivers of Health (SDOH) Interventions SDOH Screenings   Food Insecurity: No Food Insecurity (12/19/2023)  Housing: Low Risk  (12/19/2023)  Transportation Needs: No Transportation Needs (12/19/2023)  Utilities: Not At Risk (12/19/2023)  Social Connections: Socially Isolated (12/19/2023)  Tobacco Use: Medium Risk (12/19/2023)    Readmission Risk Interventions    12/21/2023    3:40 PM  Readmission Risk Prevention Plan  Transportation Screening Complete  PCP or Specialist Appt within 3-5 Days Complete  HRI or Home Care Consult Complete  Social Work Consult for Recovery Care Planning/Counseling Complete  Palliative Care Screening Not Applicable  Medication Review Oceanographer) Complete

## 2023-12-27 DIAGNOSIS — R1084 Generalized abdominal pain: Secondary | ICD-10-CM | POA: Diagnosis not present

## 2023-12-27 DIAGNOSIS — I2609 Other pulmonary embolism with acute cor pulmonale: Secondary | ICD-10-CM | POA: Diagnosis not present

## 2023-12-27 LAB — COMPREHENSIVE METABOLIC PANEL WITH GFR
ALT: 10 U/L (ref 0–44)
AST: 11 U/L — ABNORMAL LOW (ref 15–41)
Albumin: 3.3 g/dL — ABNORMAL LOW (ref 3.5–5.0)
Alkaline Phosphatase: 55 U/L (ref 38–126)
Anion gap: 10 (ref 5–15)
BUN: 7 mg/dL — ABNORMAL LOW (ref 8–23)
CO2: 21 mmol/L — ABNORMAL LOW (ref 22–32)
Calcium: 9.1 mg/dL (ref 8.9–10.3)
Chloride: 105 mmol/L (ref 98–111)
Creatinine, Ser: 0.81 mg/dL (ref 0.44–1.00)
GFR, Estimated: >60 mL/min (ref >60)
Glucose, Bld: 149 mg/dL — ABNORMAL HIGH (ref 70–99)
Potassium: 3.4 mmol/L — ABNORMAL LOW (ref 3.5–5.1)
Sodium: 136 mmol/L (ref 135–145)
Total Bilirubin: 0.6 mg/dL (ref 0.0–1.2)
Total Protein: 6.9 g/dL (ref 6.5–8.1)

## 2023-12-27 LAB — CBC
HCT: 35.1 % — ABNORMAL LOW (ref 36.0–46.0)
Hemoglobin: 12 g/dL (ref 12.0–15.0)
MCH: 30.5 pg (ref 26.0–34.0)
MCHC: 34.2 g/dL (ref 30.0–36.0)
MCV: 89.1 fL (ref 80.0–100.0)
Platelets: 396 K/uL (ref 150–400)
RBC: 3.94 MIL/uL (ref 3.87–5.11)
RDW: 13.6 % (ref 11.5–15.5)
WBC: 6.9 K/uL (ref 4.0–10.5)
nRBC: 0 % (ref 0.0–0.2)

## 2023-12-27 NOTE — Progress Notes (Signed)
 Progress Note   Patient: [name] FMW:[id number] DOB: [birth year] DOA: 12/19/2023     8 DOS: the patient was seen and examined on 12/27/2023   Brief hospital course: 78 y.o. female with medical history significant for CVA, nonobstructive CAD, HTN, HLD, asthma, GERD, RA who presented to the ED for evaluation of abdominal pain and shortness of breath. Patient states that she is having some burning sensation with urination. Patient recently admitted 6/24-6/28 for generalized weakness felt due to physical deconditioning/chronic debility.  Imaging was without evidence of acute abnormality in the spine, cord compression, or high-grade stenosis.  It did mention left foraminal to extraforaminal disc protrusion at L2-3 potentially affecting the exiting left L2 nerve root.  Patient was ultimately discharged to SNF per PT/OT recommendation. After discharge from SNF to home patient was again seen in ED on 7/21 with acute on chronic back pain interfering with mobility and ADLs.  She boarded in the ED and was discharged to SNF on 7/23.  Patient returned to home about 6 days prior to this admission.  She says she lives alone and usually ambulates with use of a cane.  Evaluation in the ED reviewed acute pulmonary embolism.  She was hospitalized for further management.   Assessment and Plan: Acute versus subacute pulmonary embolism/acute DVT The only risk factor identified is sedentary lifestyle for the last several weeks due to her back issue.  Patient denies any history of cancer.  No recent travel. Patient was seen by pulmonology.  No plans for thrombolytics for her acute PE. Echocardiogram shows normal systolic function.  Right ventricular systolic function is noted to be normal. Lower extremity Doppler study showed extensive DVT involving both extremities. Patient denies any significant pain in the legs but the legs are swollen. 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      Candidiasis r/o UTI She was given Diflucan  due to presence of yeast on the UA.  U culture show < 10,000 colonies  DC CTX   Normocytic anemia No deficiencies noted on anemia panel Periodically monitor CBC   History of coronary artery disease/history of stroke/hyperlipidemia Aspirin  on hold.   Continue statin.   Chronic back pain PT and OT recommending SNF, TOC following   History of asthma Continue home medications.   Obesity Estimated body mass index is 33.37 kg/m as calculated from the following:   Height as of this encounter: 5' 1 (1.549 m).   Weight as of this encounter: 80.1 kg.      Subjective: No complaints today  Physical Exam: Vitals:   12/27/23 0517 12/27/23 0846 12/27/23 0847 12/27/23 1310  BP: 128/79   115/75  Pulse: 82   91  Resp: 18   16  Temp: 97.8 F (36.6 C)   97.8 F (36.6 C)  TempSrc: Oral   Oral  SpO2: 97% 97% 97% 96%  Weight:      Height:       General exam: Conversant, in no acute distress Respiratory system: normal chest rise, clear, no audible wheezing Cardiovascular system: regular rhythm, s1-s2 Gastrointestinal system: Nondistended, nontender, pos BS Central nervous system: No seizures, no tremors Extremities: No cyanosis, no joint deformities Skin: No rashes,  no pallor Psychiatry: Affect normal // no auditory hallucinations   Data Reviewed:  Labs reviewed: Na 136, K 3.4, Cr 0.81, WBC 6.9, Hgb 12.0, Plts 396  Family Communication: Pt  in room, family not at bedside  Disposition: Status is: Inpatient Remains inpatient appropriate because: severity of illness  Planned Discharge Destination: Skilled  nursing facility    Author: Garnette Pelt, MD 12/27/2023 5:27 PM  For on call review [email or web address].

## 2023-12-27 NOTE — Progress Notes (Signed)
 Physical Therapy Treatment Patient Details Name: [name] MRN: [id number] DOB: Oct 02, 1945 Today's Date: 12/27/2023   History of Present Illness [name] is a 78 y.o. female presents to therapy following hospital admission on 12/19/2023 due to pain all over including angina, SOB, productive cough, burning with urination and B LE edema. Pt found to have PE and started on heparin  drip, R heart strain and L femoral DVT, small hiatal hernia and inflammatory process of bladder indicative of infection. Pt had recent hospitalization 7/21-7/23 secondary to LBP and 6/24-6/28 both hospitalizations pt d/c to SNF.  Pt EFY:pwrolizd but is not limited to:  HTN, CVA with L sided deficits UE (finger contractures) and LE (hip and knee flexion contractures), diabetes, heart attack, GERD, RA chronic LBP.    PT Comments  Patient progressing with mobility and able to stand x 4 this session with +1 A.  Unable to take steps though tried.  She seemed unaware she could not walk.  Able to use L hand on RW and pt preferred over hemiwalker that was in the room.  PT will continue to follow.     If plan is discharge home, recommend the following: Assistance with cooking/housework;Assist for transportation;Two people to help with walking and/or transfers;A lot of help with bathing/dressing/bathroom;Supervision due to cognitive status;Direct supervision/assist for financial management;Direct supervision/assist for medications management   Can travel by private vehicle        Equipment Recommendations       Recommendations for Other Services       Precautions / Restrictions Precautions Precautions: Fall Recall of Precautions/Restrictions: Impaired     Mobility  Bed Mobility Overal bed mobility: Needs Assistance Bed Mobility: Supine to Sit     Supine to sit: HOB elevated, Used rails, Mod assist Sit to supine: Max assist   General bed mobility comments: up to EOB with A for scooting hips and lifting  trunk; to supine assist for legs and trunk repositioning, RN in to help scoot to [facility]    Transfers Overall transfer level: Needs assistance Equipment used: Rolling walker (2 wheels) Transfers: Sit to/from Stand Sit to Stand: Max assist, From elevated surface, Mod assist           General transfer comment: stood x 4 from EOB with less assist needed on subsequent attempts; able to incorporate use of L hand over time with pt placing it on RW with third and fourth sit to stand; once initiated trying to pick up L foot to step though realized could not get it to move easily and reported was painful    Ambulation/Gait                   Stairs             Wheelchair Mobility     Tilt Bed    Modified Rankin (Stroke Patients Only)       Balance Overall balance assessment: Needs assistance Sitting-balance support: Feet supported Sitting balance-Leahy Scale: Fair     Standing balance support: Bilateral upper extremity supported Standing balance-Leahy Scale: Poor                              Communication Communication Communication: No apparent difficulties  Cognition Arousal: Alert Behavior During Therapy: Flat affect   PT - Cognitive impairments: No family/caregiver present to determine baseline, Problem solving  PT - Cognition Comments: reported did not realize she was not walking yet after session Following commands: Impaired Following commands impaired: Only follows one step commands consistently, Follows one step commands with increased time    Cueing Cueing Techniques: Verbal cues, Tactile cues, Visual cues  Exercises General Exercises - Lower Extremity Ankle Circles/Pumps: AROM, Both, 10 reps, Supine Short Arc Quad: AROM, Both, 10 reps, Supine Heel Slides: AAROM, Both, 5 reps, Supine Hip ABduction/ADduction: Strengthening, 10 reps, Supine, Both (hip adductor squeezes)    General Comments         Pertinent Vitals/Pain Pain Assessment Pain Assessment: Faces Faces Pain Scale: Hurts little more Pain Location: L knee with flexion Pain Descriptors / Indicators: Aching, Discomfort, Grimacing Pain Intervention(s): Monitored during session, Repositioned    Home Living                          Prior Function            PT Goals (current goals can now be found in the care plan section) Acute Rehab PT Goals Patient Stated Goal: walk PT Goal Formulation: With patient Time For Goal Achievement: 01/04/24 Potential to Achieve Goals: Good Progress towards PT goals: Progressing toward goals    Frequency    Min 2X/week      PT Plan      Co-evaluation              AM-PAC PT 6 Clicks Mobility   Outcome Measure  Help needed turning from your back to your side while in a flat bed without using bedrails?: A Lot Help needed moving from lying on your back to sitting on the side of a flat bed without using bedrails?: A Lot Help needed moving to and from a bed to a chair (including a wheelchair)?: Total Help needed standing up from a chair using your arms (e.g., wheelchair or bedside chair)?: Total Help needed to walk in hospital room?: Total Help needed climbing 3-5 steps with a railing? : Total 6 Click Score: 8    End of Session Equipment Utilized During Treatment: Gait belt Activity Tolerance: Patient limited by fatigue Patient left: in bed;with call bell/phone within reach;with bed alarm set   PT Visit Diagnosis: Muscle weakness (generalized) (M62.81);Pain;Other abnormalities of gait and mobility (R26.89) Pain - Right/Left: Left Pain - part of body: Knee     Time: 8499-8471 PT Time Calculation (min) (ACUTE ONLY): 28 min  Charges:    $Therapeutic Exercise: 8-22 mins $Therapeutic Activity: 8-22 mins PT General Charges $$ ACUTE PT VISIT: 1 Visit                     Micheline Portal, PT Acute Rehabilitation  Services [phone] 12/27/2023    Montie Portal 12/27/2023, 5:32 PM

## 2023-12-27 NOTE — Plan of Care (Signed)
  Problem: Pain Managment: Goal: General experience of comfort will improve and/or be controlled Outcome: Progressing   Problem: Safety: Goal: Ability to remain free from injury will improve Outcome: Progressing   Problem: Skin Integrity: Goal: Risk for impaired skin integrity will decrease Outcome: Progressing

## 2023-12-28 DIAGNOSIS — I2609 Other pulmonary embolism with acute cor pulmonale: Secondary | ICD-10-CM | POA: Diagnosis not present

## 2023-12-28 DIAGNOSIS — R1084 Generalized abdominal pain: Secondary | ICD-10-CM | POA: Diagnosis not present

## 2023-12-28 MED ORDER — POTASSIUM CHLORIDE CRYS ER 20 MEQ PO TBCR
60.0000 meq | EXTENDED_RELEASE_TABLET | Freq: Once | ORAL | Status: AC
  Administered 2023-12-28: 60 meq via ORAL
  Filled 2023-12-28: qty 3

## 2023-12-28 NOTE — Progress Notes (Signed)
 Progress Note   Patient: [name] FMW:[id number] DOB: 1945/10/28 DOA: 12/19/2023     9 DOS: the patient was seen and examined on 12/28/2023   Brief hospital course: 78 y.o. female with medical history significant for CVA, nonobstructive CAD, HTN, HLD, asthma, GERD, RA who presented to the ED for evaluation of abdominal pain and shortness of breath. Patient states that she is having some burning sensation with urination. Patient recently admitted 6/24-6/28 for generalized weakness felt due to physical deconditioning/chronic debility.  Imaging was without evidence of acute abnormality in the spine, cord compression, or high-grade stenosis.  It did mention left foraminal to extraforaminal disc protrusion at L2-3 potentially affecting the exiting left L2 nerve root.  Patient was ultimately discharged to SNF per PT/OT recommendation. After discharge from SNF to home patient was again seen in ED on 7/21 with acute on chronic back pain interfering with mobility and ADLs.  She boarded in the ED and was discharged to SNF on 7/23.  Patient returned to home about 6 days prior to this admission.  She says she lives alone and usually ambulates with use of a cane.  Evaluation in the ED reviewed acute pulmonary embolism.  She was hospitalized for further management.   Assessment and Plan: Acute versus subacute pulmonary embolism/acute DVT The only risk factor identified is sedentary lifestyle for the last several weeks due to her back issue.  Patient denies any history of cancer.  No recent travel. Patient was seen by pulmonology.  No plans for thrombolytics for her acute PE. Echocardiogram shows normal systolic function.  Right ventricular systolic function is noted to be normal. Lower extremity Doppler study showed extensive DVT involving both extremities. Patient denies any significant pain in the legs but the legs are swollen. 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      Candidiasis r/o UTI She was given Diflucan  due to presence of yeast on the UA.  U culture show < 10,000 colonies  DC CTX   Normocytic anemia No deficiencies noted on anemia panel Periodically monitor CBC   History of coronary artery disease/history of stroke/hyperlipidemia Aspirin  on hold.   Continue statin.   Chronic back pain PT and OT recommending SNF, TOC following   History of asthma Continue home medications.   Obesity Estimated body mass index is 33.37 kg/m as calculated from the following:   Height as of this encounter: 5' 1 (1.549 m).   Weight as of this encounter: 80.1 kg.      Subjective: Without complaints  Physical Exam: Vitals:   12/27/23 1310 12/27/23 2127 12/28/23 0604 12/28/23 1023  BP: 115/75 103/81 (!) 142/77 130/85  Pulse: 91 74 90 82  Resp: 16 19 17    Temp: 97.8 F (36.6 C) 98.5 F (36.9 C) 97.8 F (36.6 C)   TempSrc: Oral Oral Oral   SpO2: 96% 98% 95% 99%  Weight:      Height:       General exam: Awake, laying in bed, in nad Respiratory system: Normal respiratory effort, no wheezing Cardiovascular system: regular rate, s1, s2 Gastrointestinal system: Soft, nondistended, positive BS Central nervous system: CN2-12 grossly intact, strength intact Extremities: Perfused, no clubbing Skin:  Normal skin turgor, no notable skin lesions seen Psychiatry: Mood normal // no visual hallucinations   Data Reviewed:  There are no new results to review at this time.  Family Communication: Pt  in room, family not at bedside  Disposition: Status is: Inpatient Remains inpatient appropriate because: severity of illness   Planned Discharge Destination: Skilled nursing facility    Author: Garnette Pelt, MD 12/28/2023 3:42 PM  For on call review [email or web address].

## 2023-12-28 NOTE — Plan of Care (Signed)
  Problem: Nutrition: Goal: Adequate nutrition will be maintained Outcome: Progressing   Problem: Elimination: Goal: Will not experience complications related to bowel motility Outcome: Progressing   Problem: Pain Managment: Goal: General experience of comfort will improve and/or be controlled Outcome: Progressing   Problem: Safety: Goal: Ability to remain free from injury will improve Outcome: Progressing

## 2023-12-28 NOTE — TOC Progression Note (Addendum)
 Transition of Care [facility]) - Progression Note    Patient Details  Name: [name] MRN: [id number] Date of Birth: Sep 14, 1945  [facility] CM/SW Contact  [name], RN Phone Number:[phone]  12/28/2023, 11:49 AM  Clinical Narrative:    CM spoke with [facility] for [employer]. Per [facility] can offer patient a long term bed. CM will need to initiate insurance auth through [facility] and will transition to long term care with patient's medicaid. Cm at bedside to make patient aware. Patient states that she is agreeable to go to [facility] CM will initiate insurance auth.   [address] Insurance auth initiated [place] ID # [id number] pending   Expected Discharge Plan: Skilled Nursing Facility Barriers to Discharge: Continued Medical Work up               Expected Discharge Plan and Services In-house Referral: NA Discharge Planning Services: CM Consult Post Acute Care Choice: Skilled Nursing Facility Living arrangements for the past 2 months: Apartment                 DME Arranged: N/A DME Agency: NA       HH Arranged: NA HH Agency: Programmer, multimedia spoke with at [facility] Agency: Brandi with [employer]   Social Drivers of Health (SDOH) Interventions SDOH Screenings   Food Insecurity: No Food Insecurity (12/19/2023)  Housing: Low Risk  (12/19/2023)  Transportation Needs: No Transportation Needs (12/19/2023)  Utilities: Not At Risk (12/19/2023)  Social Connections: Socially Isolated (12/19/2023)  Tobacco Use: Medium Risk (12/19/2023)    Readmission Risk Interventions    12/21/2023    3:40 PM  Readmission Risk Prevention Plan  Transportation Screening Complete  PCP or Specialist Appt within 3-5 Days Complete  HRI or Home Care Consult Complete  Social Work Consult for Recovery Care Planning/Counseling Complete  Palliative Care Screening Not Applicable  Medication Review [employer]) Complete

## 2023-12-28 NOTE — Progress Notes (Signed)
 Occupational Therapy Treatment Patient Details Name: [name] MRN: [id number] DOB: December 12, 1945 Today's Date: 12/28/2023   History of present illness [name] is a 78 yr old female who presented to the hospital on 12/19/2023 due to pain all over including angina, SOB, productive cough, burning with urination and B LE edema. Pt found to have PE and started on heparin  drip, R heart strain and L femoral DVT, small hiatal hernia and inflammatory process of bladder indicative of infection. Pt had recent hospitalization 7/21-7/23 secondary to LBP and 6/24-6/28 both hospitalizations pt discharge to SNF.  Pt EFY:pwrolizd but is not limited to:  HTN, CVA with L sided deficits, diabetes, heart attack, GERD, RA chronic back pain   OT comments  The pt required max assist to perform supine to sit. She presented with fair sitting balance EOB, while performing upper body dressing and teeth brushing. Pt was then instructed on sit to stand from EOB, as safe functional transfers are needed to facilitate progressive ADL participation. She initially required max assist to stand, however improved to mod assist with second stand. She required manual assist to place impaired LUE onto RW. She further required instruction on BLE positioning on the floor and improved posture. She was unable to lift or advance her LLE, in order to take lateral steps towards the head of the bed. She was returned to supine. Continue OT plan of care. Patient will benefit from continued inpatient follow up therapy, <3 hours/day.        If plan is discharge home, recommend the following:  Assistance with cooking/housework;Direct supervision/assist for financial management;Direct supervision/assist for medications management;Assist for transportation;Two people to help with walking and/or transfers;Two people to help with bathing/dressing/bathroom   Equipment Recommendations  Other (comment) (defer to next level of care)     Recommendations for Other Services      Precautions / Restrictions Precautions Precautions: Fall Precaution/Restrictions Comments: old L sided weakness from CVA Restrictions Weight Bearing Restrictions Per Provider Order: No       Mobility Bed Mobility Overal bed mobility: Needs Assistance Bed Mobility: Supine to Sit, Sit to Supine     Supine to sit: Mod assist, HOB elevated, Used rails Sit to supine: Max assist   General bed mobility comments: pt required assist to advance her BLE off the bed, to reach for bed rail with RUE and for trunk positioning    Transfers Overall transfer level: Needs assistance Equipment used: Rolling walker (2 wheels) Transfers: Sit to/from Stand Sit to Stand: Max assist, From elevated surface, Mod assist           General transfer comment: Pt was instructed on sit to stand from EOB, as safe functional transfers are needed to facilitate progressive ADL participation. She initially required max assist to stand, however improved to mod assist with second stand. She required manual assist to place weak LUE onto RW. She further required instruction on BLE positioning on the floor and improved posture. She was unable to lift or advance her LLE, in order to take lateral steps towards the head of the bed.     Balance     Sitting balance-Leahy Scale: Fair       Standing balance-Leahy Scale: Poor       ADL either performed or assessed with clinical judgement   ADL Overall ADL's : Needs assistance/impaired     Grooming: Minimal assistance;Sitting;Oral care;Cueing for compensatory techniques Grooming Details (indicate cue type and reason): Limited by LUE ROM and strength limitations.  She performed teeth brushing seated EOB, using her dominant RUE.         Upper Body Dressing : Sitting;Moderate assistance;Cueing for sequencing;Cueing for compensatory techniques Upper Body Dressing Details (indicate cue type and reason): Pt was instructed on  the hemi-technique for upper body dressing, given her LUE weakness and ROM limitations. She required assist to doff a hospital gown then to donn another clean one. Lower Body Dressing: Sitting/lateral leans;Total assistance Lower Body Dressing Details (indicate cue type and reason): Assist needed to donn sock seated EOB                    Extremity/Trunk Assessment Upper Extremity Assessment Upper Extremity Assessment: Right hand dominant LUE Deficits / Details: No functional AROM noted. Contractures of 5th digit noted.  Suspected hypertonia. Required PROM mostly LUE Coordination: decreased fine motor;decreased gross motor             Cognition Arousal: Alert Behavior During Therapy: Flat affect Cognition: Difficult to assess, No family/caregiver present to determine baseline             OT - Cognition Comments: flucuating cognition. intermittently confused, not oriented to time.                   Following commands impaired: Follows one step commands with increased time      Cueing   Cueing Techniques: Verbal cues, Tactile cues, Visual cues, Gestural cues             Pertinent Vitals/ Pain       Pain Assessment Pain Assessment: No/denies pain   Frequency  Min 2X/week        Progress Toward Goals  OT Goals(current goals can now be found in the care plan section)     Acute Rehab OT Goals OT Goal Formulation: With patient Time For Goal Achievement: 01/04/24 Potential to Achieve Goals: Fair  Plan         AM-PAC OT 6 Clicks Daily Activity     Outcome Measure   Help from another person eating meals?: A Little Help from another person taking care of personal grooming?: A Little Help from another person toileting, which includes using toliet, bedpan, or urinal?: A Lot Help from another person bathing (including washing, rinsing, drying)?: A Lot Help from another person to put on and taking off regular upper body clothing?: A Lot Help from  another person to put on and taking off regular lower body clothing?: A Lot 6 Click Score: 14    End of Session Equipment Utilized During Treatment: Gait belt;Rolling walker (2 wheels)  OT Visit Diagnosis: Unsteadiness on feet (R26.81);Other abnormalities of gait and mobility (R26.89);Muscle weakness (generalized) (M62.81);Other symptoms and signs involving cognitive function;Hemiplegia and hemiparesis Hemiplegia - Right/Left: Left Hemiplegia - dominant/non-dominant: Non-Dominant   Activity Tolerance Patient tolerated treatment well   Patient Left in bed;with call bell/phone within reach;with chair alarm set;with bed alarm set   Nurse Communication Mobility status        Time: 1035-1100 OT Time Calculation (min): 25 min  Charges: OT General Charges $OT Visit: 1 Visit OT Treatments $Self Care/Home Management : 8-22 mins $Therapeutic Activity: 8-22 mins     Delanna JINNY Lesches, OTR/L 12/28/2023, 12:52 PM

## 2023-12-28 NOTE — Plan of Care (Signed)
  Problem: Clinical Measurements: Goal: Will remain free from infection Outcome: Progressing Goal: Diagnostic test results will improve Outcome: Progressing Goal: Respiratory complications will improve Outcome: Progressing Goal: Cardiovascular complication will be avoided Outcome: Progressing   Problem: Pain Managment: Goal: General experience of comfort will improve and/or be controlled Outcome: Progressing

## 2023-12-29 DIAGNOSIS — R1084 Generalized abdominal pain: Secondary | ICD-10-CM | POA: Diagnosis not present

## 2023-12-29 DIAGNOSIS — I2609 Other pulmonary embolism with acute cor pulmonale: Secondary | ICD-10-CM | POA: Diagnosis not present

## 2023-12-29 LAB — COMPREHENSIVE METABOLIC PANEL WITH GFR
ALT: 11 U/L (ref 0–44)
AST: 13 U/L — ABNORMAL LOW (ref 15–41)
Albumin: 3.2 g/dL — ABNORMAL LOW (ref 3.5–5.0)
Alkaline Phosphatase: 53 U/L (ref 38–126)
Anion gap: 11 (ref 5–15)
BUN: 7 mg/dL — ABNORMAL LOW (ref 8–23)
CO2: 21 mmol/L — ABNORMAL LOW (ref 22–32)
Calcium: 9.4 mg/dL (ref 8.9–10.3)
Chloride: 105 mmol/L (ref 98–111)
Creatinine, Ser: 0.84 mg/dL (ref 0.44–1.00)
GFR, Estimated: >60 mL/min (ref >60)
Glucose, Bld: 102 mg/dL — ABNORMAL HIGH (ref 70–99)
Potassium: 4.3 mmol/L (ref 3.5–5.1)
Sodium: 137 mmol/L (ref 135–145)
Total Bilirubin: 0.7 mg/dL (ref 0.0–1.2)
Total Protein: 6.8 g/dL (ref 6.5–8.1)

## 2023-12-29 LAB — CBC
HCT: 35.3 % — ABNORMAL LOW (ref 36.0–46.0)
Hemoglobin: 11.8 g/dL — ABNORMAL LOW (ref 12.0–15.0)
MCH: 29.9 pg (ref 26.0–34.0)
MCHC: 33.4 g/dL (ref 30.0–36.0)
MCV: 89.4 fL (ref 80.0–100.0)
Platelets: 390 K/uL (ref 150–400)
RBC: 3.95 MIL/uL (ref 3.87–5.11)
RDW: 13.8 % (ref 11.5–15.5)
WBC: 5.7 K/uL (ref 4.0–10.5)
nRBC: 0 % (ref 0.0–0.2)

## 2023-12-29 NOTE — Plan of Care (Signed)
  Problem: Clinical Measurements: Goal: Ability to maintain clinical measurements within normal limits will improve Outcome: Progressing Goal: Will remain free from infection Outcome: Progressing   Problem: Coping: Goal: Level of anxiety will decrease Outcome: Progressing   Problem: Pain Managment: Goal: General experience of comfort will improve and/or be controlled Outcome: Progressing

## 2023-12-29 NOTE — Progress Notes (Signed)
 Progress Note   Patient: [name] FMW:[id number] DOB: 1945-10-20 DOA: 12/19/2023     10 DOS: the patient was seen and examined on 12/29/2023   Brief hospital course: 78 y.o. female with medical history significant for CVA, nonobstructive CAD, HTN, HLD, asthma, GERD, RA who presented to the ED for evaluation of abdominal pain and shortness of breath. Patient states that she is having some burning sensation with urination. Patient recently admitted 6/24-6/28 for generalized weakness felt due to physical deconditioning/chronic debility.  Imaging was without evidence of acute abnormality in the spine, cord compression, or high-grade stenosis.  It did mention left foraminal to extraforaminal disc protrusion at L2-3 potentially affecting the exiting left L2 nerve root.  Patient was ultimately discharged to SNF per PT/OT recommendation. After discharge from SNF to home patient was again seen in ED on 7/21 with acute on chronic back pain interfering with mobility and ADLs.  She boarded in the ED and was discharged to SNF on 7/23.  Patient returned to home about 6 days prior to this admission.  She says she lives alone and usually ambulates with use of a cane.  Evaluation in the ED reviewed acute pulmonary embolism.  She was hospitalized for further management.   Assessment and Plan: Acute versus subacute pulmonary embolism/acute DVT The only risk factor identified is sedentary lifestyle for the last several weeks due to her back issue.  Patient denies any history of cancer.  No recent travel. Patient was seen by pulmonology.  No plans for thrombolytics for her acute PE. Echocardiogram shows normal systolic function.  Right ventricular systolic function is noted to be normal. Lower extremity Doppler study showed extensive DVT involving both extremities. Patient denies any significant pain in the legs but the legs are swollen. 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      Candidiasis r/o UTI She was given Diflucan  due to presence of yeast on the UA.  U culture show < 10,000 colonies  DC CTX   Normocytic anemia No deficiencies noted on anemia panel Periodically monitor CBC   History of coronary artery disease/history of stroke/hyperlipidemia Aspirin  on hold.   Continue statin.   Chronic back pain PT and OT recommending SNF, TOC following   History of asthma Continue home medications.   Obesity Estimated body mass index is 33.37 kg/m as calculated from the following:   Height as of this encounter: 5' 1 (1.549 m).   Weight as of this encounter: 80.1 kg.      Subjective: No complaints at this time  Physical Exam: Vitals:   12/28/23 2136 12/28/23 2257 12/29/23 0526 12/29/23 0948  BP: 124/81  125/81 120/85  Pulse: 80  88 88  Resp: 16 17 16    Temp: (!) 97.5 F (36.4 C)  98.7 F (37.1 C)   TempSrc: Oral  Oral   SpO2: 97%  99%   Weight:      Height:       General exam: Conversant, in no acute distress Respiratory system: normal chest rise, clear, no audible wheezing Cardiovascular system: regular rhythm, s1-s2 Gastrointestinal system: Nondistended, nontender, pos BS Central nervous system: No seizures, no tremors  Extremities: No cyanosis, no joint deformities Skin: No rashes, no pallor Psychiatry: Affect normal // no auditory hallucinations   Data Reviewed:  There are no new results to review at this time.  Family Communication: Pt  in room, family not at bedside  Disposition: Status is: Inpatient Remains inpatient appropriate because: severity of illness  Planned Discharge  Destination: Skilled nursing facility    Author: Garnette Pelt, MD 12/29/2023 3:57 PM  For on call review [email or web address].

## 2023-12-30 DIAGNOSIS — I2609 Other pulmonary embolism with acute cor pulmonale: Secondary | ICD-10-CM | POA: Diagnosis not present

## 2023-12-30 DIAGNOSIS — R1084 Generalized abdominal pain: Secondary | ICD-10-CM | POA: Diagnosis not present

## 2023-12-30 MED ORDER — LACTATED RINGERS IV SOLN: INTRAVENOUS | Status: AC

## 2023-12-30 NOTE — Plan of Care (Signed)
  Problem: Clinical Measurements: Goal: Will remain free from infection Outcome: Progressing   Problem: Coping: Goal: Level of anxiety will decrease Outcome: Progressing   Problem: Skin Integrity: Goal: Risk for impaired skin integrity will decrease Outcome: Progressing

## 2023-12-30 NOTE — Progress Notes (Signed)
 Progress Note   Patient: [name] FMW:[id number] DOB: [birth year] DOA: 12/19/2023     11 DOS: the patient was seen and examined on 12/30/2023   Brief hospital course: 78 y.o. female with medical history significant for CVA, nonobstructive CAD, HTN, HLD, asthma, GERD, RA who presented to the ED for evaluation of abdominal pain and shortness of breath. Patient states that she is having some burning sensation with urination. Patient recently admitted 6/24-6/28 for generalized weakness felt due to physical deconditioning/chronic debility.  Imaging was without evidence of acute abnormality in the spine, cord compression, or high-grade stenosis.  It did mention left foraminal to extraforaminal disc protrusion at L2-3 potentially affecting the exiting left L2 nerve root.  Patient was ultimately discharged to SNF per PT/OT recommendation. After discharge from SNF to home patient was again seen in ED on 7/21 with acute on chronic back pain interfering with mobility and ADLs.  She boarded in the ED and was discharged to SNF on 7/23.  Patient returned to home about 6 days prior to this admission.  She says she lives alone and usually ambulates with use of a cane.  Evaluation in the ED reviewed acute pulmonary embolism.  She was hospitalized for further management.   Assessment and Plan: Acute versus subacute pulmonary embolism/acute DVT The only risk factor identified is sedentary lifestyle for the last several weeks due to her back issue.  Patient denies any history of cancer.  No recent travel. Patient was seen by pulmonology.  No plans for thrombolytics for her acute PE. Echocardiogram shows normal systolic function.  Right ventricular systolic function is noted to be normal. Lower extremity Doppler study showed extensive DVT involving both extremities. Patient denies any significant pain in the legs but the legs are swollen. Vascular surgery does not recommend any interventions Continue eliquis  as  tolerated, no evidence of acute blood loss   Confusion dementia vs hospital acquired delirium Cont supportive care Conversing appropriately   Dry mouth - chlorhexadine mouth wash   Hypotension with h/o HTN Antihypertensives on hold Improved after fluid bolus  Continued Toprol  XL 25mg  Holding amlodipine  & furosemide  due to soft blood pressures.      Candidiasis r/o UTI She was given Diflucan  due to presence of yeast on the UA.  U culture show < 10,000 colonies  DC CTX   Normocytic anemia No deficiencies noted on anemia panel Periodically monitor CBC   History of coronary artery disease/history of stroke/hyperlipidemia Aspirin  on hold.   Continue statin.   Chronic back pain PT and OT recommending SNF, TOC following   History of asthma Continue home medications.   Obesity Estimated body mass index is 33.37 kg/m as calculated from the following:   Height as of this encounter: 5' 1 (1.549 m).   Weight as of this encounter: 80.1 kg.  Poor PO intake -Not eating/drinking much PO. Multiple untouched meal trays are in room today -Pt admits to not eating much dinner last night -Mucus membranes appear dry -Will start gentle IVF -Liberalize diet to Regular -consult Dietitian      Subjective: Without complaints this AM  Physical Exam: Vitals:   12/30/23 0511 12/30/23 1029 12/30/23 1030 12/30/23 1352  BP: 132/78 136/84 136/84 (!) 149/90  Pulse: 91 87 86 84  Resp: 18   20  Temp: 97.8 F (36.6 C)   98.3 F (36.8 C)  TempSrc: Oral   Oral  SpO2: 96%  96% 92%  Weight:      Height:  General exam: Awake, laying in bed, in nad Respiratory system: Normal respiratory effort, no wheezing Cardiovascular system: regular rate, s1, s2 Gastrointestinal system: Soft, nondistended, positive BS Central nervous system: CN2-12 grossly intact, strength intact Extremities: Perfused, no clubbing Skin: Normal skin turgor, no notable skin lesions seen Psychiatry: Mood normal //  no visual hallucinations   Data Reviewed:  There are no new results to review at this time.  Family Communication: Pt  in room, family not at bedside  Disposition: Status is: Inpatient Remains inpatient appropriate because: severity of illness  Planned Discharge Destination: Skilled nursing facility    Author: Garnette Pelt, MD 12/30/2023 5:21 PM  For on call review [email or web address].

## 2023-12-31 DIAGNOSIS — I2609 Other pulmonary embolism with acute cor pulmonale: Secondary | ICD-10-CM | POA: Diagnosis not present

## 2023-12-31 DIAGNOSIS — R1084 Generalized abdominal pain: Secondary | ICD-10-CM | POA: Diagnosis not present

## 2023-12-31 LAB — CBC
HCT: 38.5 % (ref 36.0–46.0)
Hemoglobin: 12.9 g/dL (ref 12.0–15.0)
MCH: 30.2 pg (ref 26.0–34.0)
MCHC: 33.5 g/dL (ref 30.0–36.0)
MCV: 90.2 fL (ref 80.0–100.0)
Platelets: 365 K/uL (ref 150–400)
RBC: 4.27 MIL/uL (ref 3.87–5.11)
RDW: 14.3 % (ref 11.5–15.5)
WBC: 7.5 K/uL (ref 4.0–10.5)
nRBC: 0 % (ref 0.0–0.2)

## 2023-12-31 LAB — COMPREHENSIVE METABOLIC PANEL WITH GFR
ALT: 14 U/L (ref 0–44)
AST: 14 U/L — ABNORMAL LOW (ref 15–41)
Albumin: 3.3 g/dL — ABNORMAL LOW (ref 3.5–5.0)
Alkaline Phosphatase: 60 U/L (ref 38–126)
Anion gap: 9 (ref 5–15)
BUN: 9 mg/dL (ref 8–23)
CO2: 23 mmol/L (ref 22–32)
Calcium: 9.5 mg/dL (ref 8.9–10.3)
Chloride: 105 mmol/L (ref 98–111)
Creatinine, Ser: 0.75 mg/dL (ref 0.44–1.00)
GFR, Estimated: >60 mL/min (ref >60)
Glucose, Bld: 103 mg/dL — ABNORMAL HIGH (ref 70–99)
Potassium: 4 mmol/L (ref 3.5–5.1)
Sodium: 137 mmol/L (ref 135–145)
Total Bilirubin: 0.4 mg/dL (ref 0.0–1.2)
Total Protein: 6.9 g/dL (ref 6.5–8.1)

## 2023-12-31 MED ORDER — ENSURE PLUS HIGH PROTEIN PO LIQD: 237.0000 mL | Freq: Two times a day (BID) | ORAL | Status: DC

## 2023-12-31 MED ORDER — ADULT MULTIVITAMIN W/MINERALS CH
1.0000 | ORAL_TABLET | Freq: Every day | ORAL | Status: DC
  Administered 2023-12-31 – 2024-01-01 (×2): 1 via ORAL
  Filled 2023-12-31 (×2): qty 1

## 2023-12-31 MED ORDER — MIRTAZAPINE 15 MG PO TABS
15.0000 mg | ORAL_TABLET | Freq: Every day | ORAL | Status: DC
  Administered 2023-12-31: 15 mg via ORAL
  Filled 2023-12-31: qty 1

## 2023-12-31 NOTE — TOC Transition Note (Signed)
 Transition of Care [facility]) - Discharge Note   Patient Details  Name: [name] MRN: [id number] Date of Birth: 05/28/1945  [facility] CM/SW Contact:  [name], RN Phone Number:[phone]  12/31/2023, 12:48 PM   Clinical Narrative:    Cm spoke with Asberry  at [facility]. Insurance shara has been approved  Plan auth ID [id number] 8/22-8/26.  1300 Per Md patients appetite has declined for the past 2 days. Diet liberalized and medication adjustment made. Will plan for possible d/c tomorrow. Asberry with [facility] has been updated.      Barriers to Discharge: Continued Medical Work up   Patient Goals and CMS Choice Patient states their goals for this hospitalization and ongoing recovery are:: To go to SNF CMS Medicare.gov Compare Post Acute Care list provided to:: Patient Choice offered to / list presented to : Patient  ownership interest in [facility].provided to:: Patient    Discharge Placement                       Discharge Plan and Services Additional resources added to the After Visit Summary for   In-house Referral: NA Discharge Planning Services: CM Consult Post Acute Care Choice: Skilled Nursing Facility          DME Arranged: N/A DME Agency: NA       HH Arranged: NA HH Agency: Programmer, multimedia spoke with at [facility] Agency: Brandi with [employer]  Social Drivers of Health (SDOH) Interventions SDOH Screenings   Food Insecurity: No Food Insecurity (12/19/2023)  Housing: Low Risk  (12/19/2023)  Transportation Needs: No Transportation Needs (12/19/2023)  Utilities: Not At Risk (12/19/2023)  Social Connections: Socially Isolated (12/19/2023)  Tobacco Use: Medium Risk (12/19/2023)     Readmission Risk Interventions    12/21/2023    3:40 PM  Readmission Risk Prevention Plan  Transportation Screening Complete  PCP or Specialist Appt within 3-5 Days Complete  HRI or Home Care Consult  Complete  Social Work Consult for Recovery Care Planning/Counseling Complete  Palliative Care Screening Not Applicable  Medication Review Oceanographer) Complete

## 2023-12-31 NOTE — Progress Notes (Signed)
 Progress Note   Patient: [name] FMW:[id number] DOB: 1945/06/21 DOA: 12/19/2023     12 DOS: the patient was seen and examined on 12/31/2023   Brief hospital course: 78 y.o. female with medical history significant for CVA, nonobstructive CAD, HTN, HLD, asthma, GERD, RA who presented to the ED for evaluation of abdominal pain and shortness of breath. Patient states that she is having some burning sensation with urination. Patient recently admitted 6/24-6/28 for generalized weakness felt due to physical deconditioning/chronic debility.  Imaging was without evidence of acute abnormality in the spine, cord compression, or high-grade stenosis.  It did mention left foraminal to extraforaminal disc protrusion at L2-3 potentially affecting the exiting left L2 nerve root.  Patient was ultimately discharged to SNF per PT/OT recommendation. After discharge from SNF to home patient was again seen in ED on 7/21 with acute on chronic back pain interfering with mobility and ADLs.  She boarded in the ED and was discharged to SNF on 7/23.  Patient returned to home about 6 days prior to this admission.  She says she lives alone and usually ambulates with use of a cane.  Evaluation in the ED reviewed acute pulmonary embolism.  She was hospitalized for further management.   Assessment and Plan: Acute versus subacute pulmonary embolism/acute DVT The only risk factor identified is sedentary lifestyle for the last several weeks due to her back issue.  Patient denies any history of cancer.  No recent travel. Patient was seen by pulmonology.  No plans for thrombolytics for her acute PE. Echocardiogram shows normal systolic function.  Right ventricular systolic function is noted to be normal. Lower extremity Doppler study showed extensive DVT involving both extremities. Patient denies any significant pain in the legs but the legs are swollen. Vascular surgery does not recommend any interventions Continue eliquis  as  tolerated, no evidence of acute blood loss   Confusion dementia vs hospital acquired delirium Cont supportive care Conversing appropriately   Dry mouth - chlorhexadine mouth wash   Hypotension with h/o HTN Antihypertensives on hold Improved after fluid bolus  Continued Toprol  XL 25mg  Holding amlodipine  & furosemide  due to soft blood pressures.      Candidiasis r/o UTI She was given Diflucan  due to presence of yeast on the UA.  U culture show < 10,000 colonies  DC CTX   Normocytic anemia No deficiencies noted on anemia panel Periodically monitor CBC   History of coronary artery disease/history of stroke/hyperlipidemia Aspirin  on hold.   Continue statin.   Chronic back pain PT and OT recommending SNF, TOC following   History of asthma Continue home medications.   Obesity Estimated body mass index is 33.37 kg/m as calculated from the following:   Height as of this encounter: 5' 1 (1.549 m).   Weight as of this encounter: 80.1 kg.  Poor PO intake -Not eating/drinking much PO. Multiple untouched meal trays are in room today -Pt admits to not eating much dinner last night -Liberalize diet to Regular -consulted Dietitian -ordered trial of reglan QSH      Subjective: Reports not having an appetite  Physical Exam: Vitals:   12/30/23 2052 12/31/23 0525 12/31/23 1011 12/31/23 1356  BP: 104/65 125/77 112/86 118/74  Pulse: 80 82 88 93  Resp: 18 20  18   Temp: 97.6 F (36.4 C) 98.3 F (36.8 C)  97.9 F (36.6 C)  TempSrc: Oral Oral  Oral  SpO2: 95% 98% 96% 95%  Weight:      Height:  General exam: Awake, laying in bed, in nad Respiratory system: Normal respiratory effort, no wheezing Cardiovascular system: regular rate, s1, s2 Gastrointestinal system: Soft, nondistended, positive BS Central nervous system: CN2-12 grossly intact, strength intact Extremities: Perfused, no clubbing Skin: Normal skin turgor, no notable skin lesions seen Psychiatry: Mood  normal // no visual hallucinations   Data Reviewed:  Labs reviewed: Na 137, K 4.0, Cr 0.75, WBC 7.5, Hgb 12.9, Plts 365  Family Communication: Pt  in room, family not at bedside  Disposition: Status is: Inpatient Remains inpatient appropriate because: severity of illness  Planned Discharge Destination: Skilled nursing facility    Author: Garnette Pelt, MD 12/31/2023 7:21 PM  For on call review [email or web address].

## 2023-12-31 NOTE — Progress Notes (Signed)
 Occupational Therapy Treatment Patient Details Name: [name] MRN: [id number] DOB: 1945-10-28 Today's Date: 12/31/2023   History of present illness [name] is a 78 y.o. female presents to therapy following hospital admission on 12/19/2023 due to pain all over including angina, SOB, productive cough, burning with urination and B LE edema. Pt found to have PE and started on heparin  drip, R heart strain and L femoral DVT, small hiatal hernia and inflammatory process of bladder indicative of infection. Pt had recent hospitalization 7/21-7/23 secondary to LBP and 6/24-6/28 both hospitalizations pt d/c to SNF.  Pt EFY:pwrolizd but is not limited to:  HTN, CVA with L sided deficits UE (finger contractures) and LE (hip and knee flexion contractures), diabetes, heart attack, GERD, RA chronic LBP.   OT comments  Pt seen for OT tx this date. Pt requires MAX A +2 for bed mobility and requests to use BSC once seated EOB. Attempted x 2 to perform STS transfer with MAX A +2 and use of hemi walker, pt unable to clear posterior fully and extend at knees / hips and spontaneously returns to seated. Stedy ultimately used - pt requires cues for hand placement to pull to stand with RUE, and for BLE placement. Pt transferred to [facility] for continent BM and urine void, TOTAL A for pericare (pt provided with washcloth to attempt on her own, but washes face instead). Pt transferred to recliner with Stedy.  Of note, pt continues with varying cognition. Pt alert to self, but not location. Pt also intermittently confused, describing visual hallucinations, the man in the hallway is going to prison, and tangential. Pt will continue to require significant 24/7 physical and cognitive assist for impairments in balance, strength, cognition and coordination with hx of chronic L-sided hemiparesis. Discharge recommendation appropriate. OT will continue to follow.       If plan is discharge home, recommend the following:   Assistance with cooking/housework;Direct supervision/assist for financial management;Direct supervision/assist for medications management;Assist for transportation;Two people to help with walking and/or transfers;Two people to help with bathing/dressing/bathroom   Equipment Recommendations  Other (comment)       Precautions / Restrictions Precautions Precautions: Fall Recall of Precautions/Restrictions: Impaired Precaution/Restrictions Comments: old L sided weakness from CVA Restrictions Weight Bearing Restrictions Per Provider Order: No       Mobility Bed Mobility Overal bed mobility: Needs Assistance Bed Mobility: Supine to Sit     Supine to sit: Max assist, +2 for physical assistance, HOB elevated     General bed mobility comments: pt required assist to advance her BLE off the bed, to reach for bed rail with RUE and for trunk positioning    Transfers Overall transfer level: Needs assistance Equipment used: Ambulation equipment used, Hemi-walker Transfers: Sit to/from Stand Sit to Stand: Max assist, +2 physical assistance, Via lift equipment           General transfer comment: Pt attempted to perform STS from EOB with +2, unable to shift weight to weak LLE to rise fully upright to standing, pt clearing posterior off bed but immediately returning to seated. attemtped 2x to perform full STS, stedy ultimately used for BM urgency and efficiency. Once stedy placed, pt requires MAX A to rise and to place flaps behind buttocks. Pt able to pull to partial standing with RUE. Transfer via Lift Equipment: Stedy   Balance Overall balance assessment: Needs assistance Sitting-balance support: Feet supported Sitting balance-Leahy Scale: Fair Sitting balance - Comments: initially with posterior push requiring MAX A, able  to correct with time and cues Postural control: Posterior lean   Standing balance-Leahy Scale: Zero                             ADL either performed or  assessed with clinical judgement   ADL Overall ADL's : Needs assistance/impaired                         Toilet Transfer: BSC/3in1;+2 for physical assistance;Maximal assistance;Total assistance;Cueing for sequencing;Cueing for safety Toilet Transfer Details (indicate cue type and reason): attempted SPT with R hemi walker from bed > BSC, pt unable to stand with MAX A - TOTAL A +2, difficulties shifting weight forward to standing with weak LLE. Stedy used for urgency reasons, Toileting- Architect and Hygiene: Total assistance;Sit to/from stand Toileting - Clothing Manipulation Details (indicate cue type and reason): TOTAL A for clothing mgmt and hygeine BSC level in stedy. pt handed washcoth to attempt pericare, washes face instead.     Functional mobility during ADLs: Maximal assistance;+2 for physical assistance;Cueing for sequencing;Cueing for safety       Communication Communication Communication: No apparent difficulties   Cognition Arousal: Alert Behavior During Therapy: Flat affect Cognition: Difficult to assess, No family/caregiver present to determine baseline             OT - Cognition Comments: flucuating cognition. intermittently confused, not oriented to place. reporting visual hallucinations - tangetial in speech but comments later in session on how she is going to rehab.                 Following commands: Impaired Following commands impaired: Follows one step commands with increased time      Cueing   Cueing Techniques: Verbal cues, Tactile cues, Visual cues, Gestural cues        General Comments RN in room and notified of pt's visual hallucinations and BM    Pertinent Vitals/ Pain       Pain Assessment Pain Assessment: No/denies pain Pain Score: 0-No pain   Frequency  Min 2X/week        Progress Toward Goals  OT Goals(current goals can now be found in the care plan section)  Progress towards OT goals: Progressing  toward goals  Acute Rehab OT Goals OT Goal Formulation: With patient Time For Goal Achievement: 01/04/24 Potential to Achieve Goals: Fair ADL Goals Pt Will Perform Upper Body Dressing: with min assist;sitting Pt Will Perform Lower Body Dressing: with min assist;sitting/lateral leans;with adaptive equipment Pt Will Transfer to Toilet: with min assist;stand pivot transfer;bedside commode Additional ADL Goal #1: Pt will perform bed mobility with CGA, in prep for progressive ADL participation.  Plan         AM-PAC OT 6 Clicks Daily Activity     Outcome Measure   Help from another person eating meals?: A Little Help from another person taking care of personal grooming?: A Little Help from another person toileting, which includes using toliet, bedpan, or urinal?: A Lot Help from another person bathing (including washing, rinsing, drying)?: A Lot Help from another person to put on and taking off regular upper body clothing?: A Lot Help from another person to put on and taking off regular lower body clothing?: A Lot 6 Click Score: 14    End of Session Equipment Utilized During Treatment: Other (comment);Gait belt (hemi walker, stedy)  OT Visit Diagnosis: Unsteadiness on feet (R26.81);Other abnormalities of gait  and mobility (R26.89);Muscle weakness (generalized) (M62.81);Other symptoms and signs involving cognitive function;Hemiplegia and hemiparesis Hemiplegia - Right/Left: Left Hemiplegia - dominant/non-dominant: Non-Dominant   Activity Tolerance Patient tolerated treatment well   Patient Left in chair;with call bell/phone within reach;with chair alarm set   Nurse Communication Mobility status        Time: 8881-8853 OT Time Calculation (min): 28 min  Charges: OT General Charges $OT Visit: 1 Visit OT Treatments $Self Care/Home Management : 23-37 mins Moncerrat Burnstein L. Zetha Kuhar, OTR/L  12/31/23, 12:57 PM

## 2023-12-31 NOTE — Progress Notes (Signed)
 Initial Nutrition Assessment  DOCUMENTATION CODES:   Non-severe (moderate) malnutrition in context of acute illness/injury  INTERVENTION:   -Ensure Plus High Protein po BID, each supplement provides 350 kcal and 20 grams of protein.   -Multivitamin with minerals daily  -Staff to order menu items pt likes, currently on automatic trays, likely d/t confusion  NUTRITION DIAGNOSIS:   Moderate Malnutrition related to acute illness as evidenced by mild muscle depletion, percent weight loss, energy intake < or equal to 75% for > or equal to 1 month.  GOAL:   Patient will meet greater than or equal to 90% of their needs  MONITOR:   PO intake, Supplement acceptance  REASON FOR ASSESSMENT:   Consult Assessment of nutrition requirement/status, Poor PO  ASSESSMENT:   78 y.o. female with medical history significant for CVA, nonobstructive CAD, HTN, HLD, asthma, GERD, RA who presented to the ED for evaluation of abdominal pain and shortness of breath. Patient states that she is having some burning sensation with urination.  Patient in room, trying to eat some lunch. Pt states she isn't really hungry. Ate 1 bite of meatloaf. RN ordered pt some chips as didn't want lunch.  Pt reports at home, she doesn't have an appetite. A bit confused so reports at times eats breakfast and other time skips breakfast and only eats lunch. Skips dinner. States she doesn't drink Ensure or Boost but willing to try here. Ordered chocolate flavor.   Per weight records, pt has lost 15 lbs since 5/6 (7% wt loss x 3.5 months, significant for time frame).  Medications: Remeron , Multivitamin with minerals daily, Miralax , Senokot  Labs reviewed.  NUTRITION - FOCUSED PHYSICAL EXAM:  Flowsheet Row Most Recent Value  Orbital Region No depletion  Upper Arm Region No depletion  Thoracic and Lumbar Region No depletion  Buccal Region No depletion  Temple Region Mild depletion  Clavicle Bone Region Mild depletion   Clavicle and Acromion Bone Region Mild depletion  Scapular Bone Region Mild depletion  Dorsal Hand No depletion  Patellar Region No depletion  Anterior Thigh Region No depletion  Posterior Calf Region No depletion  Edema (RD Assessment) Mild  [BLE]  Hair Reviewed  Eyes Reviewed  Mouth Reviewed  Skin Reviewed  Nails Reviewed    Diet Order:   Diet Order             Diet regular Room service appropriate? Yes; Fluid consistency: Thin  Diet effective now                   EDUCATION NEEDS:   No education needs have been identified at this time  Skin:  Skin Assessment: Reviewed RN Assessment  Last BM:  8/25 -type 6  Height:   Ht Readings from Last 1 Encounters:  12/23/23 5' 1 (1.549 m)    Weight:   Wt Readings from Last 1 Encounters:  12/23/23 80.1 kg    BMI:  Body mass index is 33.37 kg/m.  Estimated Nutritional Needs:   Kcal:  1250-1450  Protein:  75-90g  Fluid:  1.5L/day   Morna Lee, MS, RD, LDN Inpatient Clinical Dietitian Contact via Secure chat

## 2023-12-31 NOTE — Progress Notes (Signed)
 Mobility Specialist - Progress Note   12/31/23 0900  Mobility  Activity Stood at bedside  Level of Assistance Maximum assist, patient does 25-49%  Assistive Device Front wheel walker  Range of Motion/Exercises Active Assistive  Activity Response Tolerated fair  Mobility Referral Yes  Mobility visit 1 Mobility  Mobility Specialist Start Time (ACUTE ONLY) 0930  Mobility Specialist Stop Time (ACUTE ONLY) [id number]  Mobility Specialist Time Calculation (min) (ACUTE ONLY) 18 min   Pt was found in bed and agreeable to mobilize. Found soiled and assisted with pericare along w/ NT. Pt had x1 failed STS. Able to do x3 STS afterwards. Although unable to bring hips forward to stand straight. At EOS returned to bed with all needs met. Call bell in reach.  Erminio Leos,  Mobility Specialist Can be reached via Secure Chat

## 2024-01-01 DIAGNOSIS — I2609 Other pulmonary embolism with acute cor pulmonale: Secondary | ICD-10-CM | POA: Diagnosis not present

## 2024-01-01 DIAGNOSIS — R1084 Generalized abdominal pain: Secondary | ICD-10-CM | POA: Diagnosis not present

## 2024-01-01 LAB — COMPREHENSIVE METABOLIC PANEL WITH GFR
ALT: 13 U/L (ref 0–44)
AST: 11 U/L — ABNORMAL LOW (ref 15–41)
Albumin: 3.1 g/dL — ABNORMAL LOW (ref 3.5–5.0)
Alkaline Phosphatase: 52 U/L (ref 38–126)
Anion gap: 10 (ref 5–15)
BUN: 9 mg/dL (ref 8–23)
CO2: 23 mmol/L (ref 22–32)
Calcium: 9.2 mg/dL (ref 8.9–10.3)
Chloride: 104 mmol/L (ref 98–111)
Creatinine, Ser: 0.77 mg/dL (ref 0.44–1.00)
GFR, Estimated: >60 mL/min (ref >60)
Glucose, Bld: 97 mg/dL (ref 70–99)
Potassium: 3.7 mmol/L (ref 3.5–5.1)
Sodium: 137 mmol/L (ref 135–145)
Total Bilirubin: 0.6 mg/dL (ref 0.0–1.2)
Total Protein: 6.3 g/dL — ABNORMAL LOW (ref 6.5–8.1)

## 2024-01-01 LAB — CBC
HCT: 36.9 % (ref 36.0–46.0)
Hemoglobin: 12.5 g/dL (ref 12.0–15.0)
MCH: 30.3 pg (ref 26.0–34.0)
MCHC: 33.9 g/dL (ref 30.0–36.0)
MCV: 89.3 fL (ref 80.0–100.0)
Platelets: 346 K/uL (ref 150–400)
RBC: 4.13 MIL/uL (ref 3.87–5.11)
RDW: 14.1 % (ref 11.5–15.5)
WBC: 6.4 K/uL (ref 4.0–10.5)
nRBC: 0 % (ref 0.0–0.2)

## 2024-01-01 MED ORDER — IBUPROFEN 400 MG PO TABS: 400.0000 mg | ORAL_TABLET | Freq: Four times a day (QID) | ORAL | Status: AC | PRN

## 2024-01-01 MED ORDER — MIRTAZAPINE 15 MG PO TABS: 15.0000 mg | ORAL_TABLET | Freq: Every day | ORAL | 0 refills | Status: AC

## 2024-01-01 MED ORDER — APIXABAN 5 MG PO TABS: 5.0000 mg | ORAL_TABLET | Freq: Two times a day (BID) | ORAL | 0 refills | Status: AC

## 2024-01-01 MED ORDER — POLYETHYLENE GLYCOL 3350 17 G PO PACK: 17.0000 g | PACK | Freq: Two times a day (BID) | ORAL | Status: AC

## 2024-01-01 MED ORDER — ENSURE PLUS HIGH PROTEIN PO LIQD: 237.0000 mL | Freq: Two times a day (BID) | ORAL | Status: AC

## 2024-01-01 MED ORDER — METOPROLOL SUCCINATE ER 25 MG PO TB24: 25.0000 mg | ORAL_TABLET | Freq: Every day | ORAL | 0 refills | Status: AC

## 2024-01-01 NOTE — Plan of Care (Signed)
   Problem: Elimination: Goal: Will not experience complications related to bowel motility Outcome: Progressing   Problem: Safety: Goal: Ability to remain free from injury will improve Outcome: Progressing   Problem: Skin Integrity: Goal: Risk for impaired skin integrity will decrease Outcome: Progressing

## 2024-01-01 NOTE — TOC Transition Note (Signed)
 Transition of Care [facility]) - Discharge Note   Patient Details  Name: [name] MRN: [id number] Date of Birth: 1945-05-24  Transition of [facility]) CM/SW Contact:  [name], RN Phone [phone]  01/01/2024, 1:56 PM   Clinical Narrative:    Patient to discharge to [facility]. CM has confirmed with [facility] in admissions. D/c summary has been faxed. D/c packet is at nurses station. CM at bedside to update patient. Patient verbalized understanding. CM requested permission to notify next of kin. Per patient she does not want CM to notify family. Patient states that she will call family tomorrow for updated. No other TOC needs noted. Bedside RN has been given info to call report.      Barriers to Discharge: Continued Medical Work up   Patient Goals and CMS Choice Patient states their goals for this hospitalization and ongoing recovery are:: To go to SNF CMS Medicare.gov Compare Post Acute Care list provided to:: Patient Choice offered to / list presented to : Patient [place] ownership interest in [facility].provided to:: Patient    Discharge Placement                       Discharge Plan and Services Additional resources added to the After Visit Summary for   In-house Referral: NA Discharge Planning Services: CM Consult Post Acute Care Choice: Skilled Nursing Facility          DME Arranged: N/A DME Agency: NA       HH Arranged: NA HH Agency: Programmer, multimedia spoke with at [facility] Agency: Brandi with [employer]  Social Drivers of Health (SDOH) Interventions SDOH Screenings   Food Insecurity: No Food Insecurity (12/19/2023)  Housing: Low Risk  (12/19/2023)  Transportation Needs: No Transportation Needs (12/19/2023)  Utilities: Not At Risk (12/19/2023)  Social Connections: Socially Isolated (12/19/2023)  Tobacco Use: Medium Risk (12/19/2023)     Readmission Risk Interventions    12/21/2023    3:40 PM  Readmission  Risk Prevention Plan  Transportation Screening Complete  PCP or Specialist Appt within 3-5 Days Complete  HRI or Home Care Consult Complete  Social Work Consult for Recovery Care Planning/Counseling Complete  Palliative Care Screening Not Applicable  Medication Review Ocean[facility]) Complete

## 2024-01-01 NOTE — Plan of Care (Signed)

## 2024-01-01 NOTE — Discharge Summary (Signed)
 Physician Discharge Summary   Patient: [name] MRN: [id number] DOB: 1945-11-25  Admit date:     12/19/2023  Discharge date: 01/01/24  Discharge Physician: Garnette Pelt   PCP: Rosalea Rosina [place]IL[place], [place]   Recommendations at discharge:    Follow up with PCP in 1-2 weeks Please encourage patient to increase oral intake  Discharge Diagnoses: Principal Problem:   Acute pulmonary embolism (HCC) Active Problems:   Acute deep vein thrombosis (DVT) of femoral vein of left lower extremity (HCC)   Essential hypertension   Asthma  Resolved Problems:   * No resolved hospital problems. *  Hospital Course: 78 y.o. female with medical history significant for CVA, nonobstructive CAD, HTN, HLD, asthma, GERD, RA who presented to the ED for evaluation of abdominal pain and shortness of breath. Patient states that she is having some burning sensation with urination. Patient recently admitted 6/24-6/28 for generalized weakness felt due to physical deconditioning/chronic debility.  Imaging was without evidence of acute abnormality in the spine, cord compression, or high-grade stenosis.  It did mention left foraminal to extraforaminal disc protrusion at L2-3 potentially affecting the exiting left L2 nerve root.  Patient was ultimately discharged to SNF per PT/OT recommendation. After discharge from SNF to home patient was again seen in ED on 7/21 with acute on chronic back pain interfering with mobility and ADLs.  She boarded in the ED and was discharged to SNF on 7/23.  Patient returned to home about 6 days prior to this admission.  She says she lives alone and usually ambulates with use of a cane.  Evaluation in the ED reviewed acute pulmonary embolism.  She was hospitalized for further management.   Assessment and Plan: Acute versus subacute pulmonary embolism/acute DVT The only risk factor identified is sedentary lifestyle for the last several weeks due to her back issue.  Patient denies any history  of cancer.  No recent travel. Patient was seen by pulmonology.  No plans for thrombolytics for her acute PE. Echocardiogram shows normal systolic function.  Right ventricular systolic function is noted to be normal. Lower extremity Doppler study showed extensive DVT involving both extremities. Patient denies any significant pain in the legs but the legs are swollen. 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  U culture show < 10,000 colonies  DC CTX   Normocytic anemia No deficiencies noted on anemia panel Periodically monitor CBC   History of coronary artery disease/history of stroke/hyperlipidemia Aspirin  stopped as pt is now on anticoagulation  Continue statin.   Chronic back pain PT and OT consulted   History of asthma Continue home medications.   Obesity Estimated body mass index is 33.37 kg/m as calculated from the following:   Height as of this encounter: 5' 1 (1.549 m).   Weight as of this encounter: 80.1 kg.   Poor PO intake -Not eating/drinking much PO. Multiple untouched meal trays are in room today -Pt admits to not eating much -Liberalize diet -consulted Dietitian -ordered trial of reglan at bedtime           Consultants: Vascular Surgery Procedures performed:   Disposition: Long term care facility  Diet recommendation:  Regular diet DISCHARGE MEDICATION: Allergies as of 01/01/2024   No Known Allergies      Medication List     STOP taking these medications    amLODipine  10 MG tablet Commonly known as: NORVASC    amLODipine  5 MG tablet Commonly known as: NORVASC    aspirin  EC 325 MG  tablet   aspirin  EC 81 MG tablet   diazepam  2 MG tablet Commonly known as: Valium    furosemide  40 MG tablet Commonly known as: LASIX    gabapentin  400 MG capsule Commonly known as: NEURONTIN    guaifenesin 100 MG/5ML syrup Commonly known as: ROBITUSSIN   Ingrezza  80 MG capsule Generic drug: valbenazine    meclizine  12.5 MG tablet Commonly known as: ANTIVERT    oxyCODONE -acetaminophen  5-325 MG tablet Commonly known as: PERCOCET/ROXICET   oxymetazoline 0.05 % nasal spray Commonly known as: AFRIN       TAKE these medications    albuterol  108 (90 Base) MCG/ACT inhaler Commonly known as: VENTOLIN  HFA Inhale 2 puffs into the lungs every 6 (six) hours as needed for wheezing or shortness of breath.   apixaban  5 MG Tabs tablet Commonly known as: ELIQUIS  Take 1 tablet (5 mg total) by mouth 2 (two) times daily.   atorvastatin  20 MG tablet Commonly known as: LIPITOR Take 20 mg by mouth daily.   benztropine  0.5 MG tablet Commonly known as: COGENTIN  Take 0.5 mg by mouth 2 (two) times daily.   Breztri  Aerosphere 160-9-4.8 MCG/ACT Aero inhaler Generic drug: budesonide -glycopyrrolate -formoterol  Inhale 2 puffs into the lungs in the morning and at bedtime. What changed: when to take this   buPROPion  150 MG 24 hr tablet Commonly known as: WELLBUTRIN  XL Take 150 mg by mouth at bedtime.   esomeprazole  40 MG capsule Commonly known as: NexIUM  Take 1 capsule (40 mg total) by mouth daily at 12 noon.   feeding supplement Liqd Take 237 mLs by mouth 2 (two) times daily between meals.   fluPHENAZine  5 MG tablet Commonly known as: PROLIXIN  Take 5 mg by mouth 3 (three) times daily.   ibuprofen  400 MG tablet Commonly known as: ADVIL  Take 1 tablet (400 mg total) by mouth every 6 (six) hours as needed for moderate pain (pain score 4-6).   metoprolol  succinate 25 MG 24 hr tablet Commonly known as: TOPROL -XL Take 1 tablet (25 mg total) by mouth daily. Start taking on: January 02, 2024 What changed:  medication strength how much to take   mirtazapine  15 MG tablet Commonly known as: REMERON  Take 1 tablet (15 mg total) by mouth at bedtime.   montelukast  10 MG tablet Commonly known as: Singulair  Take 1 tablet (10 mg total) by mouth at bedtime.   nitroGLYCERIN  0.3 MG SL tablet Commonly known as: NITROSTAT  Place 0.3 mg under the tongue every 5 (five) minutes as needed for chest pain.   omeprazole  20 MG capsule Commonly known as: PRILOSEC Take 20 mg by mouth daily.   polyethylene glycol 17 g packet Commonly known as: MIRALAX  / GLYCOLAX  Take 17 g by mouth 2 (two) times daily.   rosuvastatin  10 MG tablet Commonly known as: CRESTOR  Take 1 tablet (10 mg total) by mouth daily.   Trelegy Ellipta 100-62.5-25 MCG/ACT Aepb Generic drug: Fluticasone -Umeclidin-Vilant Inhale 1 puff into the lungs daily.   Vitamin D (Ergocalciferol) 1.25 MG (50000 UNIT) Caps capsule Commonly known as: DRISDOL Take 50,000 Units by mouth once a week.        Follow-up Information     Rosalea Rosina [place]IL[place], [place]  Follow up in 2 week(s).   Specialty: Physician Assistant Why: Hospital follow up Contact information: [address] [place] [address] [phone]                Discharge Exam: [name]   12/19/23 2208 12/19/23 2339 12/23/23 0600  Weight: 86.9 kg 76.3 kg 80.1 kg   General exam: Awake, laying in bed, in nad Respiratory system: Normal respiratory effort, no wheezing Cardiovascular system: regular rate, s1, s2 Gastrointestinal system: Soft, nondistended, positive BS Central nervous system: CN2-12 grossly intact, strength intact Extremities: Perfused, no clubbing Skin: Normal skin turgor, no notable skin lesions seen Psychiatry: Mood normal // no visual hallucinations   Condition at discharge: fair  The results of significant diagnostics from this hospitalization (including imaging, microbiology, ancillary and laboratory) are listed below for  reference.   Imaging Studies: DG Abd 1 View Result Date: 12/22/2023 CLINICAL DATA:  Epigastric pain, abdominal distention. EXAM: ABDOMEN - 1 VIEW COMPARISON:  None Available. FINDINGS: Nonobstructive bowel gas pattern. No organomegaly, visible free air or suspicious calcification. IMPRESSION: No acute findings. Electronically Signed   By: Franky Crease M.D.   On: 12/22/2023 19:20   CT VENOGRAM ABD/PELVIS/LOWER EXT BILAT Result Date: 12/21/2023 CLINICAL DATA:  Bilateral DVT by lower extremity venous duplex ultrasound extending to the common femoral vein level bilaterally. Left lower lobe pulmonary embolism by recent CT of the abdomen. EXAM: CT VENOGRAM ABDOMEN AND PELVIS AND LOWER EXTREMITY BILATERAL TECHNIQUE: Venographic phase images of the abdomen, pelvis and lower extremities were obtained following the administration of intravenous contrast. Multiplanar reformats and maximum intensity projections were generated. RADIATION DOSE REDUCTION: This exam was performed according to the departmental dose-optimization program which includes automated exposure control, adjustment of the mA and/or kV according to patient size and/or use of iterative reconstruction technique. CONTRAST:  OMNIPAQUE  IOHEXOL  350 MG/ML SOLN COMPARISON:  CT of the abdomen on 12/19/2023. Venous duplex ultrasound report on 12/20/2023. FINDINGS: Lower chest: Upper most image shows a small amount of nonocclusive thrombus in a posterior left lower lobe pulmonary artery branch. No pleural effusions or consolidation at the lung bases. Hepatobiliary: No focal liver abnormality is seen. No gallstones, gallbladder wall thickening, or biliary dilatation. Pancreas: Unremarkable. No pancreatic ductal dilatation or surrounding inflammatory changes. Spleen: Normal in size without focal abnormality. Adrenals/Urinary Tract: Adrenal glands are unremarkable. Kidneys are normal, without renal calculi, focal lesion, or hydronephrosis. Bladder is  unremarkable. Stomach/Bowel: Bowel shows no evidence of obstruction, ileus, inflammation or lesion. The appendix is not discretely visualized. No free intraperitoneal air. Vascular/Lymphatic: No arterial vascular abnormalities identified. No lymphadenopathy. Reproductive: Status post hysterectomy. No adnexal masses. Other: No abdominal wall hernia or abnormality. No abdominopelvic ascites. Musculoskeletal: No acute or significant osseous findings. IVC: Normally patent and positioned IVC without evidence of thrombus, stenosis or mass effect. Portal and mesenteric veins: Normally patent. Bilateral iliac veins: Normally patent bilateral iliac veins. No evidence of thrombus, stenosis or mass effect. Right lower extremity: Nonocclusive thrombus within the inferior aspect of the common femoral vein extending into the right femoral vein. Thrombus visualized in the popliteal vein. Left lower extremity: Nonocclusive thrombus in the common femoral vein extends into the saphenofemoral junction. No visualized left femoral or popliteal vein DVT. IMPRESSION: 1. Nonocclusive thrombus in the inferior aspect of the right common femoral vein extending into the right femoral vein. Thrombus visualized in the right popliteal vein. 2. Nonocclusive thrombus in the left common femoral vein extends into the saphenofemoral junction. No visualized left femoral  or popliteal vein DVT. 3. No evidence of iliac vein or IVC thrombus. 4. Small amount of nonocclusive thrombus in a posterior left lower lobe pulmonary artery branch as seen by prior abdominal CT. Electronically Signed   By: Marcey Moan M.D.   On: 12/21/2023 15:56   VAS US  LOWER EXTREMITY VENOUS (DVT) Result Date: 12/20/2023  Lower Venous DVT Study Patient Name:  LASHAWNDA HANCOX  Date of Exam:   12/20/2023 Medical Rec #: [id number]         Accession #:    [id number] Date of Birth: [birth year]        Patient Gender: F Patient Age:   84 years Exam [facility]  [facility]  Procedure:      VAS US  LOWER EXTREMITY VENOUS (DVT) Referring Phys: REXENE BLUSH --------------------------------------------------------------------------------  Indications: Pulmonary embolism.  Anticoagulation: Heparin . Limitations: Poor ultrasound/tissue interface, patient positioning, patient immobility and body habitus. Comparison Study: No prior studies. Performing Technologist: Cordella Collet RVT  Examination Guidelines: A complete evaluation includes B-mode imaging, spectral Doppler, color Doppler, and power Doppler as needed of all accessible portions of each vessel. Bilateral testing is considered an integral part of a complete examination. Limited examinations for reoccurring indications may be performed as noted. The reflux portion of the exam is performed with the patient in reverse Trendelenburg.  +---------+---------------+---------+-----------+----------+-------------------+ RIGHT    CompressibilityPhasicitySpontaneityPropertiesThrombus Aging      +---------+---------------+---------+-----------+----------+-------------------+ CFV      Partial        Yes      Yes                  Acute               +---------+---------------+---------+-----------+----------+-------------------+ SFJ      Partial                                      Acute               +---------+---------------+---------+-----------+----------+-------------------+ FV Prox  Partial        Yes      Yes                  Acute               +---------+---------------+---------+-----------+----------+-------------------+ FV Mid   None           No       No                   Acute               +---------+---------------+---------+-----------+----------+-------------------+ FV DistalNone           No       No                   Acute               +---------+---------------+---------+-----------+----------+-------------------+ PFV      Full                                                              +---------+---------------+---------+-----------+----------+-------------------+ POP      None  No       No                   Acute               +---------+---------------+---------+-----------+----------+-------------------+ PTV      Full                                                             +---------+---------------+---------+-----------+----------+-------------------+ PERO                                                  Not well visualized +---------+---------------+---------+-----------+----------+-------------------+ Gastroc  Partial                                      Acute               +---------+---------------+---------+-----------+----------+-------------------+ EIV                     Yes      Yes                                      +---------+---------------+---------+-----------+----------+-------------------+   +---------+---------------+---------+-----------+----------+-------------------+ LEFT     CompressibilityPhasicitySpontaneityPropertiesThrombus Aging      +---------+---------------+---------+-----------+----------+-------------------+ CFV      Partial        Yes      Yes                  Acute               +---------+---------------+---------+-----------+----------+-------------------+ SFJ      None                                         Acute               +---------+---------------+---------+-----------+----------+-------------------+ FV Prox  Full                                                             +---------+---------------+---------+-----------+----------+-------------------+ FV Mid   Full                                                             +---------+---------------+---------+-----------+----------+-------------------+ FV DistalFull                                                              +---------+---------------+---------+-----------+----------+-------------------+  PFV      Full                                                             +---------+---------------+---------+-----------+----------+-------------------+ POP      Full           Yes      Yes                                      +---------+---------------+---------+-----------+----------+-------------------+ PTV                                                   Not well visualized +---------+---------------+---------+-----------+----------+-------------------+ PERO                                                  Not well visualized +---------+---------------+---------+-----------+----------+-------------------+ EIV                     Yes      Yes                                      +---------+---------------+---------+-----------+----------+-------------------+     Summary: RIGHT: - Findings consistent with acute deep vein thrombosis involving the right common femoral vein, SF junction, right femoral vein, and right popliteal vein. Findings consistent with acute intramuscular thrombosis involving the right gastrocnemius veins. - No cystic structure found in the popliteal fossa.  LEFT: - Findings consistent with acute deep vein thrombosis involving the left common femoral vein, and SF junction. - Findings consistent with acute superficial vein thrombosis involving the left great saphenous vein.  - No cystic structure found in the popliteal fossa.  *See table(s) above for measurements and observations. Electronically signed by Gaile New MD on 12/20/2023 at 7:11:03 PM.    Final    ECHOCARDIOGRAM COMPLETE Result Date: 12/20/2023    ECHOCARDIOGRAM REPORT   Patient Name:   [name] Date of Exam: 12/20/2023 Medical Rec #:  [id number]        Height:       61.0 in Accession #:    [id number]       Weight:       168.2 lb Date of Birth:  Oct 20, 1945       BSA:          1.755 m Patient Age:    77  years         BP:           114/71 mmHg Patient Gender: F                HR:           109 bpm. Exam Location:  Inpatient Procedure: Cardiac Doppler, Color Doppler and Intracardiac Opacification Agent            (Both Spectral and Color Flow  Doppler were utilized during            procedure). Indications:    Pulmonary Embolism  History:        Patient has prior history of Echocardiogram examinations, most                 recent 02/22/2022. Stroke; Risk Factors:Hypertension, Sleep                 Apnea, Diabetes and Dyslipidemia.  Sonographer:    Jayson Gaskins Referring Phys: [id number] VISHAL R PATEL  Sonographer Comments: Technically difficult study due to poor echo windows. IMPRESSIONS  1. Left ventricular ejection fraction, by estimation, is 65 to 70%. The left ventricle has normal function. The left ventricle has no regional wall motion abnormalities. Left ventricular diastolic parameters are consistent with Grade I diastolic dysfunction (impaired relaxation).  2. Right ventricular systolic function is normal. The right ventricular size is normal. There is normal pulmonary artery systolic pressure. The estimated right ventricular systolic pressure is 30.7 mmHg.  3. The mitral valve is grossly normal. No evidence of mitral valve regurgitation. No evidence of mitral stenosis.  4. The aortic valve is grossly normal. Aortic valve regurgitation is not visualized. No aortic stenosis is present.  5. The inferior vena cava is normal in size with greater than 50% respiratory variability, suggesting right atrial pressure of 3 mmHg. Comparison(s): No significant change from prior study. FINDINGS  Left Ventricle: Left ventricular ejection fraction, by estimation, is 65 to 70%. The left ventricle has normal function. The left ventricle has no regional wall motion abnormalities. Definity  contrast agent was given IV to delineate the left ventricular  endocardial borders. The left ventricular internal cavity size was normal in  size. There is no left ventricular hypertrophy. Left ventricular diastolic parameters are consistent with Grade I diastolic dysfunction (impaired relaxation). Right Ventricle: The right ventricular size is normal. No increase in right ventricular wall thickness. Right ventricular systolic function is normal. There is normal pulmonary artery systolic pressure. The tricuspid regurgitant velocity is 2.63 m/s, and  with an assumed right atrial pressure of 3 mmHg, the estimated right ventricular systolic pressure is 30.7 mmHg. Left Atrium: Left atrial size was normal in size. Right Atrium: Right atrial size was normal in size. Pericardium: There is no evidence of pericardial effusion. Mitral Valve: The mitral valve is grossly normal. No evidence of mitral valve regurgitation. No evidence of mitral valve stenosis. Tricuspid Valve: The tricuspid valve is grossly normal. Tricuspid valve regurgitation is trivial. No evidence of tricuspid stenosis. Aortic Valve: The aortic valve is grossly normal. Aortic valve regurgitation is not visualized. No aortic stenosis is present. Pulmonic Valve: The pulmonic valve was grossly normal. Pulmonic valve regurgitation is not visualized. No evidence of pulmonic stenosis. Aorta: The aortic root and ascending aorta are structurally normal, with no evidence of dilitation. Venous: The inferior vena cava is normal in size with greater than 50% respiratory variability, suggesting right atrial pressure of 3 mmHg. IAS/Shunts: The atrial septum is grossly normal.  LEFT VENTRICLE PLAX 2D LVIDd:         4.20 cm Diastology LVIDs:         2.80 cm LV e' medial:    5.11 cm/s LV PW:         1.10 cm LV E/e' medial:  9.1 LV IVS:        0.80 cm LV e' lateral:   5.98 cm/s  LV E/e' lateral: 7.8  IVC IVC diam: 1.60 cm  AORTA Ao Asc diam: 3.10 cm MITRAL VALVE               TRICUSPID VALVE MV Area (PHT): 4.15 cm    TR Peak grad:   27.7 mmHg MV E velocity: 46.70 cm/s  TR Vmax:        263.00  cm/s MV A velocity: 85.20 cm/s MV E/A ratio:  0.55 Darryle Decent MD Electronically signed by Darryle Decent MD Signature Date/Time: 12/20/2023/10:48:10 AM    Final    CT ABDOMEN PELVIS W CONTRAST Addendum Date: 12/19/2023 ADDENDUM REPORT: 12/19/2023 21:00 ADDENDUM: These results were called by telephone at the time of interpretation on 12/19/2023 at 9:00 pm to provider SCOTT ZACKOWSKI , who verbally acknowledged these results. Electronically Signed   By: Dorethia Molt M.D.   On: 12/19/2023 21:00   Result Date: 12/19/2023 CLINICAL DATA:  Acute nonlocalized abdominal pain, generalized weakness EXAM: CT ABDOMEN AND PELVIS WITH CONTRAST TECHNIQUE: Multidetector CT imaging of the abdomen and pelvis was performed using the standard protocol following bolus administration of intravenous contrast. RADIATION DOSE REDUCTION: This exam was performed according to the departmental dose-optimization program which includes automated exposure control, adjustment of the mA and/or kV according to patient size and/or use of iterative reconstruction technique. CONTRAST:  OMNIPAQUE  IOHEXOL  300 MG/ML  SOLN COMPARISON:  None Available. FINDINGS: Lower chest: The mid images of the thorax demonstrate an intraluminal central filling defect within the left main pulmonary artery peripherally extending into the lower lobar pulmonary arteries compatible with an acute to subacute pulmonary embolism. The central pulmonary arteries are enlarged in keeping with changes of pulmonary arterial hypertension. There is CT evidence of right heart strain with relative shift of the intraventricular septum to the left (15/3) which is new from prior examination of 06/08/2022. Visualized lung bases are clear. Small hiatal hernia. Hepatobiliary: No focal liver abnormality is seen. No gallstones, gallbladder wall thickening, or biliary dilatation. Pancreas: Unremarkable Spleen: Unremarkable Adrenals/Urinary Tract: The adrenal glands are unremarkable. The  kidneys are normal in size and position. No hydronephrosis. No intrarenal or ureteral calculi. The bladder is mildly distended. There is hyperemia the bladder wall and subtle perivesicular inflammatory stranding which suggests an underlying infectious or inflammatory cystitis. Stomach/Bowel: Stomach is within normal limits. Appendix appears normal. No evidence of bowel wall thickening, distention, or inflammatory changes. Vascular/Lymphatic: There is expansile intraluminal filling defect within the left common femoral vein (76/3) in keeping with probable acute DVT. The abdominal vasculature is otherwise unremarkable. No pathologic adenopathy within the abdomen and pelvis. Reproductive: Status post hysterectomy. No adnexal masses. Other: No abdominal wall hernia or abnormality. No abdominopelvic ascites. Musculoskeletal: No acute bone abnormality. No lytic or blastic bone lesion. Osseous structures are age appropriate. IMPRESSION: 1. Acute to subacute pulmonary embolism within the left main pulmonary artery peripherally extending into the lower lobar pulmonary arteries. CT evidence of right heart strain with relative shift of the intraventricular septum to the left. 2. Acute DVT within the left common femoral vein. 3. Hyperemia of the bladder wall and subtle perivesicular inflammatory stranding suggesting an underlying infectious or inflammatory cystitis. Correlation with urinalysis and urine culture may be helpful. 4. Small hiatal hernia. Electronically Signed: By: Dorethia Molt M.D. On: 12/19/2023 20:56   CT Head Wo Contrast Result Date: 12/19/2023 CLINICAL DATA:  Altered mental status EXAM: CT HEAD WITHOUT CONTRAST TECHNIQUE: Contiguous axial images were obtained from the base of the skull through the vertex  without intravenous contrast. RADIATION DOSE REDUCTION: This exam was performed according to the departmental dose-optimization program which includes automated exposure control, adjustment of the mA  and/or kV according to patient size and/or use of iterative reconstruction technique. COMPARISON:  None Available. FINDINGS: Brain: Normal anatomic configuration. No abnormal intra or extra-axial mass lesion or fluid collection. No abnormal mass effect or midline shift. No evidence of acute intracranial hemorrhage or infarct. Ventricular size is normal. Cerebellum unremarkable. Vascular: Unremarkable Skull: Intact Sinuses/Orbits: Paranasal sinuses are clear. Orbits are unremarkable. Other: Mastoid air cells and middle ear cavities are clear. IMPRESSION: 1. No acute intracranial abnormality. Normal examination. Electronically Signed   By: Dorethia Molt M.D.   On: 12/19/2023 21:00   DG Chest Port 1 View Result Date: 12/19/2023 CLINICAL DATA:  Abdominal pain EXAM: PORTABLE CHEST - 1 VIEW COMPARISON:  October 30, 2023 FINDINGS: Low lung volumes with elevation of the right hemidiaphragm. No focal airspace consolidation, pleural effusion, or pneumothorax. No cardiomegaly.Aortic atherosclerosis. Osteopenia. No acute fracture or destructive lesion. Mild scoliosis of the thoracic spine with multilevel degenerative disc disease. IMPRESSION: Low lung volumes with left basilar atelectasis. Electronically Signed   By: Rogelia Myers M.D.   On: 12/19/2023 20:28    Microbiology: Results for orders placed or performed during the hospital encounter of 12/19/23  MRSA Next Gen by PCR, Nasal     Status: None   Collection Time: 12/19/23 10:59 PM   Specimen: Nasal Mucosa; Nasal Swab  Result Value Ref Range Status   MRSA by PCR Next Gen NOT DETECTED NOT DETECTED Final    Comment: (NOTE) The GeneXpert MRSA Assay (FDA approved for NASAL specimens only), is one component of a comprehensive MRSA colonization surveillance program. It is not intended to diagnose MRSA infection nor to guide or monitor treatment for MRSA infections. Test performance is not FDA approved in patients less than 70 years old. Performed at [facility], [address] [place], [place] [address]   Urine Culture (for pregnant, neutropenic or urologic patients or patients with an indwelling urinary catheter)     Status: Abnormal   Collection Time: 12/20/23  3:30 AM   Specimen: Urine, Clean Catch  Result Value Ref Range Status   Specimen Description   Final    URINE, CLEAN CATCH Performed at [facility], [address] [place], [place] [address]    Special Requests   Final    NONE Performed at [facility], [address], [place], [place] [address]    Culture (A)  Final    <10,000 COLONIES/mL INSIGNIFICANT GROWTH Performed at [facility], [address], [place], [place] [address]    Report Status 12/21/2023 FINAL  Final    Labs: CBC: Recent Labs  Lab 12/25/23 1341 12/27/23 0945 12/29/23 0817 12/31/23 0655 01/01/24 0643  WBC 8.2 6.9 5.7 7.5 6.4  HGB 11.5* 12.0 11.8* 12.9 12.5  HCT 33.7* 35.1* 35.3* 38.5 36.9  MCV 86.6 89.1 89.4 90.2 89.3  PLT 382 396 390 365 346   Basic Metabolic Panel: Recent Labs  Lab 12/27/23 0945 12/29/23 0817 12/31/23 0655 01/01/24 0643  NA 136 137 137 137  K 3.4* 4.3 4.0 3.7  CL 105 105 105 104  CO2 21* 21* 23 23  GLUCOSE 149* 102* 103* 97  BUN 7* 7* 9 9  CREATININE 0.81 0.84 0.75 0.77  CALCIUM  9.1 9.4 9.5 9.2   Liver Function Tests: Recent Labs  Lab 12/27/23 0945 12/29/23  9182 12/31/23 0655 01/01/24 0643  AST 11* 13* 14* 11*  ALT 10 11 14 13   ALKPHOS 55 53 60 52  BILITOT 0.6 0.7 0.4 0.6  PROT 6.9 6.8 6.9 6.3*  ALBUMIN 3.3* 3.2* 3.3* 3.1*   CBG: No results for input(s): GLUCAP in the last 168 hours.  Discharge time spent: less than 30 minutes.  Signed: Garnette Pelt, MD [facility] 01/01/2024

## 2024-01-01 NOTE — Progress Notes (Signed)
   01/01/24 1315  Spiritual Encounters  Type of Visit Initial  Care provided to: Patient  Reason for visit Routine spiritual support  OnCall Visit No   Chaplain stopped in and visited with the patient [name] who welcomed me into her space. [name] is looking forward to returning home and being comfortable in her space again. I departed after encouraging her to enjoy her meal.   Carley [place] [facility]  [phone]

## 2024-02-14 ENCOUNTER — Other Ambulatory Visit (HOSPITAL_COMMUNITY): Payer: Self-pay | Admitting: Family Medicine

## 2024-02-14 DIAGNOSIS — R112 Nausea with vomiting, unspecified: Secondary | ICD-10-CM

## 2024-02-14 DIAGNOSIS — R140 Abdominal distension (gaseous): Secondary | ICD-10-CM

## 2024-02-14 DIAGNOSIS — R109 Unspecified abdominal pain: Secondary | ICD-10-CM

## 2024-02-27 ENCOUNTER — Ambulatory Visit (HOSPITAL_COMMUNITY)
Admission: RE | Admit: 2024-02-27 | Discharge: 2024-02-27 | Disposition: A | Discharge status: Home / Self Care | Source: Ambulatory Visit | Attending: Family Medicine | Admitting: Family Medicine

## 2024-02-27 DIAGNOSIS — R140 Abdominal distension (gaseous): Secondary | ICD-10-CM | POA: Insufficient documentation

## 2024-02-27 DIAGNOSIS — R109 Unspecified abdominal pain: Secondary | ICD-10-CM | POA: Insufficient documentation

## 2024-02-27 DIAGNOSIS — R112 Nausea with vomiting, unspecified: Secondary | ICD-10-CM | POA: Diagnosis present

## 2024-02-27 MED ORDER — IOHEXOL 300 MG/ML  SOLN
100.0000 mL | Freq: Once | INTRAMUSCULAR | Status: AC | PRN
  Administered 2024-02-27: 100 mL via INTRAVENOUS

## 2024-03-03 LAB — POCT I-STAT CREATININE: Creatinine, Ser: 0.8 mg/dL (ref 0.44–1.00)

## 2024-03-10 ENCOUNTER — Encounter: Payer: Self-pay | Admitting: Radiology

## 2024-06-13 ENCOUNTER — Emergency Department (HOSPITAL_COMMUNITY)

## 2024-06-13 ENCOUNTER — Emergency Department (HOSPITAL_COMMUNITY)
Admission: EM | Admit: 2024-06-13 | Discharge: 2024-06-13 | Disposition: A | Discharge status: Skilled Nursing Facility | Source: Home / Self Care | Attending: Emergency Medicine | Admitting: Emergency Medicine

## 2024-06-13 DIAGNOSIS — K5900 Constipation, unspecified: Secondary | ICD-10-CM

## 2024-06-13 DIAGNOSIS — F430 Acute stress reaction: Secondary | ICD-10-CM

## 2024-06-13 LAB — COMPREHENSIVE METABOLIC PANEL WITH GFR
ALT: 37 U/L (ref 0–44)
AST: 21 U/L (ref 15–41)
Albumin: 3.5 g/dL (ref 3.5–5.0)
Alkaline Phosphatase: 100 U/L (ref 38–126)
Anion gap: 11 (ref 5–15)
BUN: 10 mg/dL (ref 8–23)
CO2: 24 mmol/L (ref 22–32)
Calcium: 9.5 mg/dL (ref 8.9–10.3)
Chloride: 104 mmol/L (ref 98–111)
Creatinine, Ser: 0.59 mg/dL (ref 0.44–1.00)
GFR, Estimated: >60 mL/min
Glucose, Bld: 117 mg/dL — ABNORMAL HIGH (ref 70–99)
Potassium: 4.3 mmol/L (ref 3.5–5.1)
Sodium: 138 mmol/L (ref 135–145)
Total Bilirubin: 0.5 mg/dL (ref 0.0–1.2)
Total Protein: 6.9 g/dL (ref 6.5–8.1)

## 2024-06-13 LAB — CBC
HCT: 37.6 % (ref 36.0–46.0)
Hemoglobin: 13.5 g/dL (ref 12.0–15.0)
MCH: 32.4 pg (ref 26.0–34.0)
MCHC: 35.9 g/dL (ref 30.0–36.0)
MCV: 90.2 fL (ref 80.0–100.0)
Platelets: 433 10*3/uL — ABNORMAL HIGH (ref 150–400)
RBC: 4.17 MIL/uL (ref 3.87–5.11)
RDW: 14.8 % (ref 11.5–15.5)
WBC: 8.2 10*3/uL (ref 4.0–10.5)
nRBC: 0 % (ref 0.0–0.2)

## 2024-06-13 LAB — I-STAT CHEM 8, ED
BUN: 10 mg/dL (ref 8–23)
Calcium, Ion: 1.22 mmol/L (ref 1.15–1.40)
Chloride: 103 mmol/L (ref 98–111)
Creatinine, Ser: 0.6 mg/dL (ref 0.44–1.00)
Glucose, Bld: 111 mg/dL — ABNORMAL HIGH (ref 70–99)
HCT: 38 % (ref 36.0–46.0)
Hemoglobin: 12.9 g/dL (ref 12.0–15.0)
Potassium: 4.1 mmol/L (ref 3.5–5.1)
Sodium: 139 mmol/L (ref 135–145)
TCO2: 24 mmol/L (ref 22–32)

## 2024-06-13 LAB — URINALYSIS, ROUTINE W REFLEX MICROSCOPIC
Bilirubin Urine: NEGATIVE
Glucose, UA: NEGATIVE mg/dL
Hgb urine dipstick: NEGATIVE
Ketones, ur: NEGATIVE mg/dL
Leukocytes,Ua: NEGATIVE
Nitrite: NEGATIVE
Protein, ur: NEGATIVE mg/dL
Specific Gravity, Urine: 1.01 (ref 1.005–1.030)
pH: 8 (ref 5.0–8.0)

## 2024-06-13 LAB — I-STAT CG4 LACTIC ACID, ED: Lactic Acid, Venous: 1.7 mmol/L (ref 0.5–1.9)

## 2024-06-13 LAB — LIPASE, BLOOD: Lipase: 20 U/L (ref 11–51)

## 2024-06-13 MED ORDER — LACTATED RINGERS IV BOLUS: 1000.0000 mL | Freq: Once | INTRAVENOUS | Status: AC

## 2024-06-13 MED ORDER — IOHEXOL 300 MG/ML  SOLN
100.0000 mL | Freq: Once | INTRAMUSCULAR | Status: AC | PRN
  Administered 2024-06-13: 100 mL via INTRAVENOUS

## 2024-06-13 MED ORDER — LACTATED RINGERS IV SOLN: INTRAVENOUS | Status: AC

## 2024-06-13 NOTE — ED Triage Notes (Addendum)
 Pt BIB EMS from [facility] c/o SI x this morning with abd pain x 1 am. Pt with n/v but has not for EMS. Pt with hx of psychosis, disoriented to time. Tenderness to lower abd on palpation. VSS for EMS. EMS reports pt is bed bound, pt reports she can walk. Pt denies SI to this RN

## 2024-06-13 NOTE — ED Provider Notes (Addendum)
 " [name] EMERGENCY DEPARTMENT AT [facility] Provider Note   CSN: [id number] Arrival date & time: 06/13/24  1531     Patient presents with: Suicidal, Abdominal Pain, Nausea, and Emesis   [name] is a 79 y.o. female.   79 year old female presents with abdominal pain and suicidal ideations.  Patient states that she has been more depressed for the past 4 days.  States she gets suicidal about once a year.  States her plan is to drive her car into traffic.  Denies any prior history of suicide attempt.  Patient does come from facility and reportedly is bedbound.  States abdominal pain started acutely and is in her lower abdomen.  Characterizes constant and cramping without urinary symptoms.  She has no constipation.  Has had emesis x 1.  Review of her history shows that she does have a history of psychosis.  EMS was called and patient transported here       Prior to Admission medications  Medication Sig Start Date End Date Taking? Authorizing Provider  albuterol  (VENTOLIN  HFA) 108 (90 Base) MCG/ACT inhaler Inhale 2 puffs into the lungs every 6 (six) hours as needed for wheezing or shortness of breath. 03/09/23   Lorin Norris, MD  apixaban  (ELIQUIS ) 5 MG TABS tablet Take 1 tablet (5 mg total) by mouth 2 (two) times daily. 01/01/24   Cindy Garnette POUR, MD  atorvastatin  (LIPITOR) 20 MG tablet Take 20 mg by mouth daily. 12/18/23   [provider]  benztropine  (COGENTIN ) 0.5 MG tablet Take 0.5 mg by mouth 2 (two) times daily. 10/12/23   [provider]  Budeson-Glycopyrrol-Formoterol  (BREZTRI  AEROSPHERE) 160-9-4.8 MCG/ACT AERO Inhale 2 puffs into the lungs in the morning and at bedtime. Patient taking differently: Inhale 2 puffs into the lungs daily. 03/09/23   Lorin Norris, MD  buPROPion  (WELLBUTRIN  XL) 150 MG 24 hr tablet Take 150 mg by mouth at bedtime. 03/22/22   [provider]  esomeprazole  (NEXIUM ) 40 MG capsule Take 1 capsule (40 mg total) by  mouth daily at 12 noon. 07/31/22   Walker, Caitlin S, NP  feeding supplement (ENSURE PLUS HIGH PROTEIN) LIQD Take 237 mLs by mouth 2 (two) times daily between meals. 01/01/24   Cindy Garnette POUR, MD  fluPHENAZine  (PROLIXIN ) 5 MG tablet Take 5 mg by mouth 3 (three) times daily.    [provider]  ibuprofen  (ADVIL ) 400 MG tablet Take 1 tablet (400 mg total) by mouth every 6 (six) hours as needed for moderate pain (pain score 4-6). 01/01/24   Cindy Garnette POUR, MD  metoprolol  succinate (TOPROL -XL) 25 MG 24 hr tablet Take 1 tablet (25 mg total) by mouth daily. 01/02/24 02/01/24  Cindy Garnette POUR, MD  mirtazapine  (REMERON ) 15 MG tablet Take 1 tablet (15 mg total) by mouth at bedtime. 01/01/24   Cindy Garnette POUR, MD  montelukast  (SINGULAIR ) 10 MG tablet Take 1 tablet (10 mg total) by mouth at bedtime. 01/12/23   Lorin Norris, MD  nitroGLYCERIN  (NITROSTAT ) 0.3 MG SL tablet Place 0.3 mg under the tongue every 5 (five) minutes as needed for chest pain. 12/17/19   [provider]  omeprazole  (PRILOSEC) 20 MG capsule Take 20 mg by mouth daily. 12/18/23   [provider]  polyethylene glycol (MIRALAX  / GLYCOLAX ) 17 g packet Take 17 g by mouth 2 (two) times daily. 01/01/24   Cindy Garnette POUR, MD  rosuvastatin  (CRESTOR ) 10 MG tablet Take 1 tablet (10 mg total) by mouth daily. 09/11/23 09/05/24  Wyn Jackee VEAR Mickey., NP  TRELEGY ELLIPTA 100-62.5-25 MCG/ACT AEPB Inhale 1 puff into the lungs daily. 12/18/23   [provider]  Vitamin D, Ergocalciferol, (DRISDOL) 1.25 MG (50000 UNIT) CAPS capsule Take 50,000 Units by mouth once a week. 05/07/22   [provider]    Allergies: Patient has no known allergies.    Review of Systems  All other systems reviewed and are negative.   Updated Vital Signs BP 123/77   Pulse (!) 109   Temp 99.1 F (37.3 C) (Oral)   Resp 16   SpO2 95%   Physical Exam Vitals and nursing note reviewed.  Constitutional:      General: She is not in acute  distress.    Appearance: Normal appearance. She is well-developed. She is not toxic-appearing.  HENT:     Head: Normocephalic and atraumatic.  Eyes:     General: Lids are normal.     Conjunctiva/sclera: Conjunctivae normal.     Pupils: Pupils are equal, round, and reactive to light.  Neck:     Thyroid : No thyroid  mass.     Trachea: No tracheal deviation.  Cardiovascular:     Rate and Rhythm: Normal rate and regular rhythm.     Heart sounds: Normal heart sounds. No murmur heard.    No gallop.  Pulmonary:     Effort: Pulmonary effort is normal. No respiratory distress.     Breath sounds: Normal breath sounds. No stridor. No decreased breath sounds, wheezing, rhonchi or rales.  Abdominal:     General: There is no distension.     Palpations: Abdomen is soft.     Tenderness: There is no abdominal tenderness. There is no guarding or rebound.   Musculoskeletal:        General: No tenderness. Normal range of motion.     Cervical back: Normal range of motion and neck supple.  Skin:    General: Skin is warm and dry.     Findings: No abrasion or rash.  Neurological:     Mental Status: She is alert and oriented to person, place, and time. Mental status is at baseline.     GCS: GCS eye subscore is 4. GCS verbal subscore is 5. GCS motor subscore is 6.     Cranial Nerves: No cranial nerve deficit.     Sensory: No sensory deficit.     Motor: Motor function is intact.  Psychiatric:        Attention and Perception: Attention normal.        Mood and Affect: Affect is blunt.        Speech: Speech normal.        Behavior: Behavior normal.        Thought Content: Thought content includes suicidal ideation. Thought content includes suicidal plan.     (all labs ordered are listed, but only abnormal results are displayed) Labs Reviewed  CULTURE, BLOOD (ROUTINE X 2)  CULTURE, BLOOD (ROUTINE X 2)  LIPASE, BLOOD  COMPREHENSIVE METABOLIC PANEL WITH GFR  CBC  URINALYSIS, ROUTINE W REFLEX  MICROSCOPIC  I-STAT CG4 LACTIC ACID, ED  I-STAT CHEM 8, ED    EKG: None  Radiology: No results found.   Procedures   Medications Ordered in the ED  lactated ringers  bolus 1,000 mL (has no administration in time range)  lactated ringers  infusion (has no administration in time range)  Medical Decision Making Amount and/or Complexity of Data Reviewed Labs: ordered. Radiology: ordered. ECG/medicine tests: ordered.  Risk Prescription drug management.   Patient's labs are without significant abnormality.  Abdominal CT did show fecal impaction with moderate stool burden.  Patient had very large BM here and states her belly feels better.  Patient denies any SI or HI at this time.  Patient is bedbound and has 0 access to weapon already.  Has no access to medications.  Risk of suicide is very low.  Will send back to her facility     Final diagnoses:  None    ED Discharge Orders     None          Dasie Faden, MD 06/13/24 1931    Dasie Faden, MD 06/13/24 1932  "

## 2024-06-13 NOTE — ED Notes (Addendum)
 Nurse attempted to call [facility] twice. Nurse spoke to nurse or nurse tech (unsure and unable to acquire name) in charge of caring for pt. Caregiver stated it was ok to bring her back to the facility.
